# Patient Record
Sex: Female | Born: 1945 | Race: Black or African American | Hispanic: No | Marital: Married | Smoking: Former smoker
Health system: Southern US, Community
[De-identification: ages and names within clinical notes are randomized; demographics above are authoritative.]

## PROBLEM LIST (undated history)

## (undated) DIAGNOSIS — I129 Hypertensive chronic kidney disease with stage 1 through stage 4 chronic kidney disease, or unspecified chronic kidney disease: Secondary | ICD-10-CM

## (undated) DIAGNOSIS — J309 Allergic rhinitis, unspecified: Secondary | ICD-10-CM

## (undated) DIAGNOSIS — M1990 Unspecified osteoarthritis, unspecified site: Secondary | ICD-10-CM

## (undated) DIAGNOSIS — N2581 Secondary hyperparathyroidism of renal origin: Secondary | ICD-10-CM

## (undated) DIAGNOSIS — J069 Acute upper respiratory infection, unspecified: Secondary | ICD-10-CM

## (undated) DIAGNOSIS — R0600 Dyspnea, unspecified: Secondary | ICD-10-CM

## (undated) DIAGNOSIS — J449 Chronic obstructive pulmonary disease, unspecified: Secondary | ICD-10-CM

## (undated) DIAGNOSIS — J45909 Unspecified asthma, uncomplicated: Secondary | ICD-10-CM

## (undated) DIAGNOSIS — E785 Hyperlipidemia, unspecified: Secondary | ICD-10-CM

## (undated) DIAGNOSIS — N189 Chronic kidney disease, unspecified: Secondary | ICD-10-CM

## (undated) DIAGNOSIS — E119 Type 2 diabetes mellitus without complications: Secondary | ICD-10-CM

## (undated) DIAGNOSIS — I10 Essential (primary) hypertension: Secondary | ICD-10-CM

## (undated) HISTORY — DX: Type 2 diabetes mellitus without complications: E11.9

## (undated) HISTORY — PX: CHOLECYSTECTOMY: SHX55

## (undated) HISTORY — DX: Chronic kidney disease, unspecified: N18.9

## (undated) HISTORY — DX: Hyperlipidemia, unspecified: E78.5

## (undated) HISTORY — PX: CORONARY ARTERY BYPASS GRAFT: SHX141

## (undated) HISTORY — DX: Secondary hyperparathyroidism of renal origin: N25.81

## (undated) HISTORY — DX: Unspecified asthma, uncomplicated: J45.909

## (undated) HISTORY — DX: Allergic rhinitis, unspecified: J30.9

## (undated) HISTORY — DX: Hypertensive chronic kidney disease with stage 1 through stage 4 chronic kidney disease, or unspecified chronic kidney disease: I12.9

## (undated) HISTORY — DX: Chronic obstructive pulmonary disease, unspecified: J44.9

## (undated) HISTORY — DX: Unspecified osteoarthritis, unspecified site: M19.90

## (undated) HISTORY — DX: Essential (primary) hypertension: I10

## (undated) HISTORY — DX: Acute upper respiratory infection, unspecified: J06.9

---

## 1979-12-21 HISTORY — PX: PARTIAL HYSTERECTOMY: SHX80

## 2014-11-29 ENCOUNTER — Encounter: Payer: Medicare Other | Attending: Internal Medicine | Admitting: Nutrition

## 2014-11-29 VITALS — Ht 62.0 in | Wt 175.0 lb

## 2014-11-29 DIAGNOSIS — R7303 Prediabetes: Secondary | ICD-10-CM

## 2014-11-29 DIAGNOSIS — E669 Obesity, unspecified: Secondary | ICD-10-CM

## 2014-11-29 NOTE — Progress Notes (Signed)
  Medical Nutrition Therapy:  Appt start time: 1100 end time:  1200.   Assessment:  Primary concerns today: Prediabetes. Lives with her husband and her dog. Has had allergy problems a lot lately preventing her from exercising outdoors. She does the cooking and shopping for the house. Most foods are fried foods. Has cut back on drinking sodas and sweets.  Her A1C was 6.2%. She notes she was craving sweets for awhile and didn't have much energy. Has history of CAD; s/p heart attack.  Preferred Learning Style:    No preference indicated   Learning Readiness:    Ready  Change in progress   MEDICATIONS:    DIETARY INTAKE:  24-hr recall:  B ( AM): Cherrios honey nut dry; raisin bran OR SKIPS most often  Snk ( AM): fruit, swwet tea or some kind of juice L ( PM): Vegetable beef soup homemade 1 cup, 1 slice bread, vienna sausages,  Sweet tea, 8oz or water down soda  Snk ( PM): fruit-grapes D ( PM): Flounder-fried; french fries and pork and beans, Mountain Dew Snk ( PM): drinks some water  Beverages: sweet tea, soda, water  Usual physical activity: walks her   Estimated energy needs: 1500 calories 170 g carbohydrates 112 g protein 42 g fat  Progress Towards Goal(s):  In progress.   Nutritional Diagnosis:  NB-1.1 Food and nutrition-related knowledge deficit As related to Obesity and prediabetes.  As evidenced by A1C of 6.2% and BMI >30.Marland Kitchen    Intervention:  Nutrition counseling for prediabetes, and weight loss on a high fiber, low fat diet using Carb Counting and  Portion control with regular exercise for needed weight loss.. Plan:  Aim for 2-3 Carb Choices per meal (30-45 grams) +/- 1 either way  Avoid snacks between meals. Cut out sweetened beverages. Avoid skipping brekfast. Choose egg with unsugared cereal and low fat milk OR 1 egg and 2 slices toast and a piece of fruit daily or just a PB sandwich would be enough for breakfast. Increase water intake to 8-10 cups per  day. Include protein in moderation with your meals  Increase fresh fruits and vegetables and cut out processed and high fat foods. Consider  increasing your activity level by 30 for 45 minutes daily as tolerated Goal: Lose 1/2-1 lb per week. 2 Get A1C less than 5.7% within the next 3-6 months.  Teaching Method Utilized:  Visual Auditory Hands on  Handouts given during visit include: The Plate Method Meal Plan Card Healthy Weight loss tips.  Barriers to learning/adherence to lifestyle change: none  Demonstrated degree of understanding via:  Teach Back   Monitoring/Evaluation:  Dietary intake, exercise, meal planning, portion control, exercise, and body weight in 1 month(s).

## 2014-12-02 NOTE — Patient Instructions (Signed)
Plan:  Aim for 2-3 Carb Choices per meal (30-45 grams) +/- 1 either way  Avoid snacks between meals. Avoid skipping brekfast. Choose egg with unsugared cereal and low fat milk OR 1 egg and 2 slices toast and a piece of fruit daily or just a PB sandwich would be enough for breakfast. Increase water intake to 8-10 cups per day. Include protein in moderation with your meals  Increase fresh fruits and vegetables and cut out processed and high fat foods. Consider  increasing your activity level by 30 for 45 minutes daily as tolerated Goal: Lose 1/2-1 lb per week. 2 Get A1C less than 5.7% within the next 3-6 months.

## 2015-02-28 ENCOUNTER — Ambulatory Visit: Payer: Medicare Other | Admitting: Nutrition

## 2015-03-14 ENCOUNTER — Ambulatory Visit: Payer: Medicare Other | Admitting: Nutrition

## 2015-04-02 ENCOUNTER — Encounter: Payer: Medicare Other | Attending: Internal Medicine | Admitting: Nutrition

## 2015-04-02 VITALS — Ht 62.0 in | Wt 178.0 lb

## 2015-04-02 DIAGNOSIS — E669 Obesity, unspecified: Secondary | ICD-10-CM

## 2015-04-02 DIAGNOSIS — R739 Hyperglycemia, unspecified: Secondary | ICD-10-CM

## 2015-04-02 NOTE — Patient Instructions (Signed)
Plan:  1. Cut out sodas, sweet and juices. 2. Cut out snacks between meals. 3. Increase low carb vegetables to 2 per meal 4. Increase water to 4-5 per day. 5.  Walking 15 minutes daily. 6. Lose 1 lb per week.

## 2015-04-02 NOTE — Progress Notes (Signed)
  Medical Nutrition Therapy:  Appt start time: 930 end time: 1000.  Assessment:  Primary concerns today: Prediabetes. Gained 3 lbs since last visit. Was trying to wean herself offf Advair. But now is taking twice a day for maintenence and thinks it is making her weight go up due to increased eating from  steroid medication.    Still drinking regular soda and tea which is contributing to her weight gain and possible breathing issues and elevated A1C.. Sugar increases CO2 production making it harder on breathing.      Diet is excessive in CHO at certain meals. Needs more fresh fruits and high fiber lower carb vegetables. Needs to drink a lot more water and cut out liquid sugar beverages.  Preferred Learning Style:    No preference indicated   Learning Readiness:    Ready  Change in progress   MEDICATIONS:    DIETARY INTAKE:  24-hr recall:  B ( AM): Protein drink and 1/2 cup of raisin bran and whole milk. Usually drinks Laidaid. And 1 1/2 slices bacon Snk ( AM): sometimes has a snack: adkins bar or jello parfait SF L ( PM)  : Roast beef sandwich on [employer] bread and corn 1/2c, Coke or Mt Dew or Tea or water  Snk ( PM): none D ( PM): Beef stew, corn, pinto beans. Sweet tea Snk ( PM): drinks some water  Beverages: sweet tea, soda, water,  Usual physical activity: Walks some.  Estimated energy needs: 1500 calories 170 g carbohydrates 112 g protein 42 g fat  Progress Towards Goal(s):  In progress.   Nutritional Diagnosis:  NB-1.1 Food and nutrition-related knowledge deficit As related to Obesity and prediabetes.  As evidenced by A1C of 6.2% and BMI >30.Marland Kitchen    Intervention:  Nutrition counseling for prediabetes, and weight loss on a high fiber, low fat diet using Carb Counting and  Portion control with regular exercise for needed weight loss.. Plan:  1. Cut out sodas, protein drinks, sweet and juices. 2. Cut out snacks between meals. 3. Increase low carb vegetables to 2 per  meal 4. Increase water to 4-5 per day. 5.  Walking 15 minutes daily. 6. Lose 1 lb per week.   Teaching Method Utilized:  Visual Auditory Hands on  Handouts given during visit include: The Plate Method Meal Plan Card Healthy Weight loss tips.  Barriers to learning/adherence to lifestyle change: none  Demonstrated degree of understanding via:  Teach Back   Monitoring/Evaluation:  Dietary intake, exercise, meal planning, portion control, exercise, and body weight in 1 month(s).

## 2015-05-21 ENCOUNTER — Encounter: Payer: Self-pay | Admitting: Nutrition

## 2015-05-21 ENCOUNTER — Encounter: Payer: Medicare Other | Attending: Physician Assistant | Admitting: Nutrition

## 2015-05-21 VITALS — Ht 62.0 in | Wt 178.0 lb

## 2015-05-21 DIAGNOSIS — E669 Obesity, unspecified: Secondary | ICD-10-CM

## 2015-05-21 DIAGNOSIS — R739 Hyperglycemia, unspecified: Secondary | ICD-10-CM

## 2015-05-21 NOTE — Patient Instructions (Addendum)
Plan:  1. Cut out sodas 100! 2. Cut out snacks between meals. 3. Continue to increase low carb vegetables to 2 per meal 4. Increase water to 4-5 per day. 5.  Walking 30  Minutes a day 4 times per week. 6. Lose 1 lb per week. 7. Get treadmill from your son and walking on it daily.

## 2015-05-21 NOTE — Progress Notes (Signed)
  Medical Nutrition Therapy:  Appt start time: 0800 end time: 0830  Assessment:  Primary concerns today: Prediabetes and overweight. No recent A1C.  Gained 3 lbs since last visit. Has been cutting down on sodas and snacksf. She has been walking some but not enough. Complians of shortness of breath at times on some days.. CHanges: Has been working adding more fresh vegetables and fruit. Has cut down on sodas and has been drinking more water.  No weight loss. Willing to increase walking since she can tell improvement in her endurance. Going to get her treadmilll from her son to use to walk inside when it's too hot outside.  Preferred Learning Style:    No preference indicated   Learning Readiness:    Ready  Change in progress   MEDICATIONS:    DIETARY INTAKE:  24-hr recall:  B ( AM): Pb sandwich OR Oatmeal and 1 egg. Snk ( AM): none L ( PM)  :  Hot dog and soda watered down. Snk ( PM): none D ( PM): Squash, baked beans, green beans, BBQ, water Snk ( PM): none Beverages: soda, water,  Usual physical activity: Walks some.  Estimated energy needs: 1500 calories 170 g carbohydrates 112 g protein 42 g fat  Progress Towards Goal(s):  In progress.   Nutritional Diagnosis:  NB-1.1 Food and nutrition-related knowledge deficit As related to Obesity and prediabetes.  As evidenced by A1C of 6.2% and BMI >30.Marland Kitchen    Intervention:  Nutrition counseling for prediabetes, and weight loss on a high fiber, low fat diet using Carb Counting and  Portion control with regular exercise for needed weight loss.. Plan:  1. Cut out sodas 100%. 2. Cut out snacks between meals un less its a vegetable.. 3. Continue to increase low carb vegetables to 2 per meal 4. Increase water to 4-5 bottles  per day. 5. Start walking 30  Minutes a day 4 times per week. 6. Lose 1 lb per week-10 lbs by next visit at least in three months. 7. Get treadmill from your son and walking on it daily.  Teaching Method  Utilized:  Visual Auditory Hands on  Handouts given during visit include: The Plate Method Meal Plan Card Healthy Weight loss tips.  Barriers to learning/adherence to lifestyle change: none  Demonstrated degree of understanding via:  Teach Back   Monitoring/Evaluation:  Dietary intake, exercise, meal planning, portion control, exercise, and body weight in 3 month(s).

## 2015-08-21 ENCOUNTER — Ambulatory Visit: Payer: Medicare Other | Admitting: Nutrition

## 2015-11-18 ENCOUNTER — Ambulatory Visit (INDEPENDENT_AMBULATORY_CARE_PROVIDER_SITE_OTHER): Payer: Medicare Other | Admitting: Allergy and Immunology

## 2015-11-18 ENCOUNTER — Encounter: Payer: Self-pay | Admitting: Allergy and Immunology

## 2015-11-18 VITALS — BP 120/68 | HR 60 | Temp 97.9°F | Resp 16

## 2015-11-18 DIAGNOSIS — J449 Chronic obstructive pulmonary disease, unspecified: Secondary | ICD-10-CM | POA: Diagnosis not present

## 2015-11-18 MED ORDER — BUDESONIDE-FORMOTEROL FUMARATE 160-4.5 MCG/ACT IN AERO: 2.0000 | INHALATION_SPRAY | Freq: Two times a day (BID) | RESPIRATORY_TRACT | Status: DC

## 2015-11-18 MED ORDER — FLUTICASONE PROPIONATE 50 MCG/ACT NA SUSP: 1.0000 | Freq: Every day | NASAL | Status: DC

## 2015-11-18 MED ORDER — MONTELUKAST SODIUM 10 MG PO TABS: 10.0000 mg | ORAL_TABLET | Freq: Every day | ORAL | Status: DC

## 2015-11-18 MED ORDER — ALBUTEROL SULFATE HFA 108 (90 BASE) MCG/ACT IN AERS: 2.0000 | INHALATION_SPRAY | Freq: Four times a day (QID) | RESPIRATORY_TRACT | Status: DC | PRN

## 2015-11-18 NOTE — Progress Notes (Signed)
FOLLOW UP NOTE  RE: Lynea Staats MRN: [id number] DOB: 06/28/1946 [facility] [place] [address], [place] [address]  Date of Office Visit: 11/18/2015  Subjective:  Jesseka Swoveland is a 69 y.o. female who presents today for Medication Management and Medication Refill  Assessment:  1.  Allergic rhinoconjunctivitis--appears well controlled. 2.  Previous history of asthma with presumed COPD--noted significant restrictive in office spirometry. 3.  Former smoker. Plan:   Meds ordered this encounter  Medications  . montelukast (SINGULAIR) 10 MG tablet    Sig: Take 1 tablet (10 mg total) by mouth daily.    Dispense:  30 tablet    Refill:  5  . fluticasone (FLONASE) 50 MCG/ACT nasal spray    Sig: Place 1 spray into both nostrils daily.    Dispense:  16 g    Refill:  5  . albuterol (VENTOLIN HFA) 108 (90 BASE) MCG/ACT inhaler    Sig: Inhale 2 puffs into the lungs every 6 (six) hours as needed for wheezing or shortness of breath (cough).    Dispense:  1 Inhaler    Refill:  2  . budesonide-formoterol (SYMBICORT) 160-4.5 MCG/ACT inhaler    Sig: Inhale 2 puffs into the lungs 2 (two) times daily.    Dispense:  1 Inhaler    Refill:  2   Patient Instructions   1. Avoidance: Mold 2. Antihistamine: Allegra 180mg  by mouth once  daily for runny nose or itching. 3. Nasal Spray: Flonase 1-2 spray(s) each nostril once daily for stuffy nose or drainage.  4. Inhalers:  Rescue: Ventolin 2 puffs every 4 hours as needed for cough or wheeze.       -May use 2 puffs 10-20 minutes prior to exercise.  Preventative: Symbicort 16mcg 2 puffs twice daily (Rinse, gargle, and spit out after use). As Ruthe Mannan was not on patient's formulary.       STOP ADVAIR 5.  Other: Singulair 10mg  each evening. 6. Nasal Saline wash followed by nasal spray once daily and each evening at shower/bath time. 7. Follow up Visit: 4 months or sooner if needed.    Referral to  Pulmonology.  HPI: Naava returns to the office in follow-up of allergic rhinitis and presumed COPD/asthma.  Since her last visit in August, she reports feeling "good" without new issues, acute care or emergency for visits, prednisone or antibiotic courses.  She may have intermittent nasal congestion but does not seem to have much cough and denies wheeze, difficulty breathing, chest congestion/tightness or shortness of breath.  She has not seen a pulmonologist prior and typically has only used Advair.  She is open to modification to her medication regime as well as evaluation by pulmonology, for additional management.  No specific disruption of sleep or activity.  She denies headache, sore throat, fever or discolored drainage.  Current Medications: 1.  Singulair 10 mg daily. 2.  Allegra 180 mg daily. 3.  Flonase 1-2 sprays once daily. 4.  Advair 500 one puff twice daily. 5.  Ventolin as needed. 6.   Continues Norvasc, aspirin, Lipitor, Mucinex, lisinopril and Toprol.  Drug Allergies: No Known Allergies  Objective:   Filed Vitals:   11/18/15 1611  BP: 120/68  Pulse: 60  Temp: 97.9 F (36.6 C)  Resp: 16   pulse ox             95%  Physical Exam  Constitutional: She is well-developed, well-nourished, and in no distress.  HENT:  Head: Atraumatic.  Right Ear: Tympanic membrane and ear canal normal.  Left Ear: Tympanic membrane and ear canal normal.  Nose: Mucosal edema present. No rhinorrhea. No epistaxis.  Mouth/Throat: Oropharynx is clear and moist and mucous membranes are normal. No oropharyngeal exudate, posterior oropharyngeal edema or posterior oropharyngeal erythema.  Neck: Neck supple.  Cardiovascular: Normal rate, S1 normal and S2 normal.   No murmur heard. Pulmonary/Chest: Effort normal. She has no wheezes. She has no rhonchi. She has no rales.  Lymphadenopathy:    She has no cervical adenopathy.    Diagnostics: Spirometry:  FVC  1.04--48%, FEV1  0.80--46%, FEV1%  96    Rose Hippler M. Ishmael Holter, MD  cc: [facility]

## 2015-11-18 NOTE — Patient Instructions (Addendum)
Take Home Sheet  1. Avoidance: Mold   2. Antihistamine: Allegra 180mg  by mouth once  daily for runny nose or itching.   3. Nasal Spray: Flonase 1-2 spray(s) each nostril once daily for stuffy nose or drainage.    4. Inhalers:  Rescue: Ventolin 2 puffs every 4 hours as needed for cough or wheeze.       -May use 2 puffs 10-20 minutes prior to exercise.   Preventative: Symbicort 185mcg 2 puffs twicedaily (Rinse, gargle, and spit out after use).      STOP ADVAIR  5.  Other: Singulair 10mg  each evening.   6. Nasal Saline wash followed by nasal spray once daily and each evening at shower/bath time.   7. Follow up Visit: 4 months or sooner if needed.    Referral to Pulmonology.  Websites that have reliable Patient information: 1. American Academy of Asthma, Allergy, & Immunology: www.aaaai.org 2. Food Allergy Network: www.foodallergy.org 3. Mothers of Asthmatics: www.aanma.org 4. [place]: DiningCalendar.de 5. American College of Allergy, Asthma, & Immunology: https://robertson.info/ or www.acaai.org

## 2015-11-20 ENCOUNTER — Telehealth: Payer: Self-pay

## 2015-11-20 NOTE — Telephone Encounter (Signed)
Called Debra [name] to advise about appointment date and time for Pulmonologist appointment (Dr. Chase Caller 02/11/16 at 2:00 PM) and she states that she doesn't want to go to anyone in [place] since she lives in [place], [place].  I told patient will voice this to Dr. Ishmael Holter and see if we can change locations and doctor for her.  Debra [name] states since she lives in [place], [place], see would like to see someone there or in [place] since she is familiar with these locations.

## 2015-11-20 NOTE — Telephone Encounter (Addendum)
Per Dr. Ishmael Holter' notes on office visit 11/18/15, she wanted the patient to be referred to a Pulmonologist.  Appointment has been made with Dr. Brand Males on Wed. 02/11/16 at 2:15 PM and arrive 2:00 PM.

## 2015-11-21 NOTE — Telephone Encounter (Signed)
Inform patient my preference is Dr. Chase Caller, and unaware of other options, we will inquire further.

## 2015-11-21 NOTE — Telephone Encounter (Signed)
Called and spoke to patient and notified of referral. Patient will follow up with Dr Davina Poke as advised.

## 2015-11-21 NOTE — Telephone Encounter (Signed)
Called and referred patient to Dr Dolores Frame neill,MD in [facility]. Patient has an appointment on December 16th at 10:45am and needs to arrive by 10:15am. Patient needs to bring a list of all medications, insurance card, and copay as needed. Called and left voicemail for patient to return phone call to notify about appointment.

## 2016-01-16 ENCOUNTER — Telehealth: Payer: Self-pay

## 2016-01-16 MED ORDER — PREDNISONE 10 MG PO TABS: ORAL_TABLET | ORAL | Status: DC

## 2016-01-16 NOTE — Telephone Encounter (Signed)
Inform patient to return to Advair and stop Symbicort.  Obtain full and further details about cough and associated symptoms, albuterol neb only or HFA and frequency etc.

## 2016-01-16 NOTE — Telephone Encounter (Signed)
Phone call to patient--reports 7 days of symptoms= congestion, cough, wheezing, chest congestion without fever, headache, sore throat or shortness of breath or difficulty in breathing.  Denies discolored drainage or other symptoms.  She started Advair today.  Pharmacy--[facility] on [address]  She Saw Dr. Marisue Ivan on [address] and they added Spiriva but did not address any of her acute symptoms.   --Begin Prednisone 30 mg daily for 2 days, 10 mg daily for 2 days and make follow-up visit for next week and she will review with the family member who can drive her [place]. --Use Saline nasal wash 2-4 times daily. --May use albuterol HFA or nebulizer 2-4 times daily. --Reviewed with Ms. Landingham if persisting symptoms or lack of improvement to call back for on-call physician throughout our office or go to the emergency department.  Patient agreed with plan.

## 2016-01-16 NOTE — Telephone Encounter (Signed)
Patient was last seen on 11/18/15 by Dr. Ishmael Holter. Patient was switched to Symbicort from Advair and she really doesn't see any improvement with Symbicort and would like to switch back to the Advair. She has 3 refills on her Advair already. Also Patient has a bad cough that she can not get rid of and is wondering is there something she could try. Ms.Branna went to see Dr. Marisue Ivan and he put her on Spiriva and would like her to take it with the Advair or Symbicort.   Please Advise  Thanks

## 2016-01-16 NOTE — Telephone Encounter (Signed)
Spoke to patient and she stated that she feels that the Symbicort was not helping her asthma and wanted to be switched back to Advair. She also stated that she had to use her neb for the first time in a long time.  She asked if something could be called in and informed her that she would need to be seen in the office before the that could happen.  She stated that she would go to an urgent care if it gets worse because she lives in [place] and that she does not drive in [place].

## 2016-01-16 NOTE — Telephone Encounter (Signed)
Patient stated that she has had a cough with nasal congestion for about a week.  She stated that she thinks that she may be coming down with a cold but she has no fever.  She stated that she has used her HFA last night and her albuterol neb this morning.  She stated that she only use either one every 6 hours as needed. But only have used them twice a day since she has been sick.

## 2016-01-16 NOTE — Telephone Encounter (Signed)
Prednisone sent to pharmacy.  Patient is already aware and will continue other medications as previously except as discussed.

## 2016-01-22 ENCOUNTER — Encounter: Payer: Self-pay | Admitting: Allergy and Immunology

## 2016-01-22 ENCOUNTER — Ambulatory Visit (INDEPENDENT_AMBULATORY_CARE_PROVIDER_SITE_OTHER): Payer: Medicare Other | Admitting: Allergy and Immunology

## 2016-01-22 VITALS — BP 136/78 | HR 56 | Temp 98.1°F | Resp 16

## 2016-01-22 DIAGNOSIS — J309 Allergic rhinitis, unspecified: Secondary | ICD-10-CM

## 2016-01-22 DIAGNOSIS — J3089 Other allergic rhinitis: Secondary | ICD-10-CM

## 2016-01-22 DIAGNOSIS — J449 Chronic obstructive pulmonary disease, unspecified: Secondary | ICD-10-CM | POA: Diagnosis not present

## 2016-01-22 NOTE — Progress Notes (Addendum)
     FOLLOW UP NOTE  RE: Debra [name] MRN: [id number] DOB: 1946-04-06 [facility] [place] [address] [place] [address] Date of Office Visit: 01/22/2016  Subjective:  Debra [name] is a 70 y.o. female who presents today for Wheezing; Cough; and Nasal Congestion  Assessment:   1. Chronic obstructive pulmonary disease, unspecified COPD, unspecified asthma type.   2. Perennial allergic rhinitis.    Plan:   Patient Instructions  1. Advair 556mcg one puff twice daily.  (rinse, gargle and spit with water after use). 2. ProAir or albuterol neb as needed for cough or wheeze every 4 hours. 3. Use sample of Dymista 1 spray twice daily-then return to Flonase 1-2 sprays once daily. 4. Continue other medications-- Singulair, Allegra daily. 5. Spiriva per Dr. Audie Box. 6. Prednisone 30mg  now and 20mg  daily for 3 days. 7. Saline nasal wash twice daily and prior to Flonase. 8. Follow-up in 1-2 months or sooner if needed.  HPI: Debra [name] returns to the office regarding cough and recent wheezing.  We added prednisone to her regime on Jan 27th which she completed yesterday with noted improvement at least 50% from her symptoms which began about 12 days ago.  She initially began with nasal congestion, sneezing, then cough and now over the last 5 days intermittent wheezing with slight chest tightness.  She denies shortness of breath, chest congestion, difficulty in breathing, fever, discolored drainage, sore throat or headache.  She reports her sleep is not disrupted and has returned to the Advair as discussed on the phone, as she thought the Symbicort was not as beneficial.  She used her albuterol neb a few times a day, the last time 4 days ago (no ProAir).  She reports the pulmonologist added Spiriva, though we are still awaiting the notes from that consultation.  Denies ED or urgent care visits, or antibiotic courses. Reports sleep and activity are normal.   She uses Zyrtec or  Allegra, but not both.  Debra [name] has a current medication list which includes the following prescription(s):  albuterol, amlodipine, aspirin, atorvastatin, cetirizine, co-enzyme q-10, fexofenadine, fluticasone-salmeterol, gabapentin, lisinopril, metoprolol tartrate, montelukast and tiotropium bromide monohydrate.   Drug Allergies: Allergies  Allergen Reactions  . Aspirin     GI upset   Objective:   Filed Vitals:   01/22/16 1052 01/22/16 1149  BP: 160/72 136/78  Pulse: 56   Temp: 98.1 F (36.7 C)   Resp: 16    SpO2 Readings from Last 1 Encounters:  01/22/16 96%   Physical Exam  Constitutional: She is well-developed, well-nourished, and in no distress.  HENT:  Head: Atraumatic.  Right Ear: Tympanic membrane and ear canal normal.  Left Ear: Tympanic membrane and ear canal normal.  Nose: Mucosal edema and rhinorrhea (scant clear mucus.) present. No epistaxis.  Mouth/Throat: Oropharynx is clear and moist and mucous membranes are normal. No oropharyngeal exudate, posterior oropharyngeal edema or posterior oropharyngeal erythema.  Neck: Neck supple.  Cardiovascular: Normal rate, S1 normal and S2 normal.   No murmur heard. Pulmonary/Chest: Effort normal. She has no wheezes. She has no rhonchi. She has no rales.  Post Xopenex/Atrovent neb: Continues to be clear.  Patient without wheeze, rhonchi or crackles. Patient reports improved.   Lymphadenopathy:    She has no cervical adenopathy.   Diagnostics: Spirometry:  FVC 1.08--50%,  FEV1 0.85--49%; postbronchodilator improvement FVC  1.16--54%, FEV1 0.88--51%.    [name] Ishmael Holter, MD  cc: [facility]

## 2016-01-22 NOTE — Patient Instructions (Addendum)
  Advair 560mcg one puff twice daily.  (rinse, gargle and spit with water after use).  ProAir or albuterol neb as needed for cough or wheeze every 4 hours.  Use sample of Dymista 1 spray twice daily-then return to Flonase 1-2 sprays once daily.  Continue other medications-- Singulair, Allegra daily.  Spiriva per Dr. Audie Box.  Prednisone 30mg  now and 20mg  daily for 3 days.  Saline nasal wash twice daily and prior to Flonase.  Follow-up in 1-2 months or sooner if needed.

## 2016-01-27 ENCOUNTER — Telehealth: Payer: Self-pay

## 2016-01-27 NOTE — Telephone Encounter (Signed)
Spoke with Dr. Ishmael Holter verbally in the office 01/27/16 and she states to inform patient that she needs to contact the lung specialist that prescribed the Spiriva.

## 2016-01-27 NOTE — Telephone Encounter (Signed)
Tried to call patient.  No answer.  Need to verify with her the medication list again.  Still having a problem with the computer and I need to verify again.

## 2016-01-27 NOTE — Telephone Encounter (Signed)
Tried to call patient again.  No answer.  Left message to call our office.

## 2016-01-27 NOTE — Telephone Encounter (Signed)
Tried to call patient again today at 4:32 PM.  No answer.  Left message to call the [place] office today or [facility] office starting at 8:30 AM on 01/28/16.

## 2016-01-27 NOTE — Addendum Note (Signed)
Addended by: Janace Hoard E on: 01/27/2016 04:09 PM   Modules accepted: Medications

## 2016-01-27 NOTE — Telephone Encounter (Signed)
Left message for patient to call me in the [place] office today or [facility] tomorrow.  Need to get medication list for Dr. Ishmael Holter.  Patient called back and she and I updated and reviewed her medications for her last visit.  While on the phone she wanted to know if perhaps Spiriva is causing her mouth to be sore.  She is complaining of her mouth being sore.  She states is rinsing, gargling and spitting with water after use.  Please advise.

## 2016-01-28 NOTE — Telephone Encounter (Signed)
Medications reviewed in epic . Called and left voicemail for patient to contact us back.

## 2016-01-28 NOTE — Telephone Encounter (Signed)
Patient notified and will contact lung doctor if needed.

## 2016-01-28 NOTE — Addendum Note (Signed)
Addended by: Gean Quint on: 01/28/2016 11:06 PM   Modules accepted: Medications

## 2016-01-28 NOTE — Telephone Encounter (Signed)
Patient returning phone call 

## 2016-02-11 ENCOUNTER — Institutional Professional Consult (permissible substitution): Payer: Medicare Other | Admitting: Internal Medicine

## 2016-03-23 ENCOUNTER — Ambulatory Visit (INDEPENDENT_AMBULATORY_CARE_PROVIDER_SITE_OTHER): Payer: Medicare Other | Admitting: Allergy and Immunology

## 2016-03-23 ENCOUNTER — Encounter: Payer: Self-pay | Admitting: Allergy and Immunology

## 2016-03-23 VITALS — BP 125/75 | HR 60 | Temp 97.6°F | Resp 17

## 2016-03-23 DIAGNOSIS — J309 Allergic rhinitis, unspecified: Secondary | ICD-10-CM

## 2016-03-23 DIAGNOSIS — J449 Chronic obstructive pulmonary disease, unspecified: Secondary | ICD-10-CM

## 2016-03-23 DIAGNOSIS — J3089 Other allergic rhinitis: Secondary | ICD-10-CM

## 2016-03-23 MED ORDER — FLUTICASONE-SALMETEROL 500-50 MCG/DOSE IN AEPB: 1.0000 | INHALATION_SPRAY | Freq: Two times a day (BID) | RESPIRATORY_TRACT | Status: DC

## 2016-03-23 NOTE — Progress Notes (Signed)
     FOLLOW UP NOTE  RE: Debra [name] MRN: [id number] DOB: 1946/09/17 [facility] Debra [name]. [place], [place] [address] Date of Office Visit: 03/23/2016  Subjective:  Debra [name] is a 70 y.o. female who presents today for Asthma  Assessment:   1. Chronic obstructive pulmonary disease, unspecified COPD, unspecified asthma type.   2. Perennial allergic rhinitis   3.      Complex medical history Plan:   Meds ordered this encounter  Medications  . Fluticasone-Salmeterol (ADVAIR DISKUS) 500-50 MCG/DOSE AEPB    Sig: Inhale 1 puff into the lungs 2 (two) times daily.    Dispense:  1 each    Refill:  3   1.  Debra [name] will continue current medication regime.--Advair and Allegra. 2.  Consistently use Dymista one spray each morning and as needed each evening.          When sample is empty, return to [facility] through the spring season one spray once to twice daily. 3.  Keep follow-up with pulmonology as planned---and additional management per their office. 4.  Follow-up in 6 months or sooner if needed.  HPI: Debra [name] returns to the office requesting refill of Advair, as she is very pleased with her regime since mild increase in symptoms at last visit in February 2017.  She notices mild rhinorrhea with the spring pollen season without congestion, sneezing, itchy, watery postnasal drip, cough or wheeze.  No shortness of breath, chest congestion or albuterol use in the last several months.  She understands that evaluation last with  Pulmonology has made new diagnoses, a term, which she cannot pronounce.  She reports they have recommended supplemental O2 at night and that she had a normal cardiac echo.  She has not been participating in much exercise.  No other new medical issues.  She has remained off Spiriva. Denies ED or urgent care visits, prednisone or antibiotic courses. Reports sleep and activity are normal.   Debra [name] has a current medication list which includes the following  prescription(s): albuterol, albuterol, amlodipine, aspirin, atorvastatin, co-enzyme q-10, fexofenadine, fluticasone-salmeterol, gabapentin, lidocaine, lisinopril, metoprolol, montelukast.   Drug Allergies: Allergies  Allergen Reactions  . Aspirin     GI upset   Objective:   Filed Vitals:   03/23/16 1011  BP: 125/75  Pulse: 60  Temp: 97.6 F (36.4 C)  Resp: 17   Physical Exam  Constitutional: She is well-developed, well-nourished, and in no distress.  HENT:  Head: Atraumatic.  Right Ear: Tympanic membrane and ear canal normal.  Left Ear: Tympanic membrane and ear canal normal.  Nose: Mucosal edema present. No rhinorrhea. No epistaxis.  Mouth/Throat: Oropharynx is clear and moist and mucous membranes are normal. No oropharyngeal exudate, posterior oropharyngeal edema or posterior oropharyngeal erythema.  Neck: Neck supple.  Cardiovascular: Normal rate, S1 normal and S2 normal.   No murmur heard. Pulmonary/Chest: Effort normal. She has no wheezes. She has no rhonchi. She has no rales.  Lymphadenopathy:    She has no cervical adenopathy.   Diagnostics: Spirometry:  FVC 1.00--51%, FEV1 0.75--46%.    Debra M. Ishmael Holter, MD  cc: [facility]

## 2016-03-23 NOTE — Patient Instructions (Addendum)
   Continue current medication regime.--Advair and Allegra.  Consistently use.  Dymista one spray each morning and as needed each evening.          When sample is empty, return to [facility] through the spring season one spray once to twice daily.  Keep follow-up with pulmonology as planned.  Follow-up in 6 months or sooner if needed.

## 2016-05-07 ENCOUNTER — Other Ambulatory Visit (HOSPITAL_COMMUNITY): Payer: Self-pay | Admitting: Nephrology

## 2016-05-07 DIAGNOSIS — N183 Chronic kidney disease, stage 3 unspecified: Secondary | ICD-10-CM

## 2016-05-14 ENCOUNTER — Ambulatory Visit (HOSPITAL_COMMUNITY)
Admission: RE | Admit: 2016-05-14 | Discharge: 2016-05-14 | Disposition: A | Discharge status: Home / Self Care | Payer: Medicare Other | Source: Ambulatory Visit | Attending: Nephrology | Admitting: Nephrology

## 2016-05-14 DIAGNOSIS — N281 Cyst of kidney, acquired: Secondary | ICD-10-CM | POA: Diagnosis not present

## 2016-05-14 DIAGNOSIS — R938 Abnormal findings on diagnostic imaging of other specified body structures: Secondary | ICD-10-CM | POA: Diagnosis not present

## 2016-05-14 DIAGNOSIS — N183 Chronic kidney disease, stage 3 unspecified: Secondary | ICD-10-CM

## 2016-06-07 ENCOUNTER — Other Ambulatory Visit: Payer: Self-pay | Admitting: Allergy and Immunology

## 2016-09-28 ENCOUNTER — Encounter: Payer: Self-pay | Admitting: Allergy & Immunology

## 2016-09-28 ENCOUNTER — Encounter (INDEPENDENT_AMBULATORY_CARE_PROVIDER_SITE_OTHER): Payer: Self-pay

## 2016-09-28 ENCOUNTER — Ambulatory Visit (INDEPENDENT_AMBULATORY_CARE_PROVIDER_SITE_OTHER): Payer: Medicare Other | Admitting: Allergy & Immunology

## 2016-09-28 VITALS — BP 120/70 | HR 65 | Temp 98.0°F | Resp 16

## 2016-09-28 DIAGNOSIS — B370 Candidal stomatitis: Secondary | ICD-10-CM | POA: Diagnosis not present

## 2016-09-28 DIAGNOSIS — J3089 Other allergic rhinitis: Secondary | ICD-10-CM | POA: Diagnosis not present

## 2016-09-28 DIAGNOSIS — J4489 Other specified chronic obstructive pulmonary disease: Secondary | ICD-10-CM | POA: Insufficient documentation

## 2016-09-28 DIAGNOSIS — J449 Chronic obstructive pulmonary disease, unspecified: Secondary | ICD-10-CM | POA: Diagnosis not present

## 2016-09-28 MED ORDER — FLUCONAZOLE 100 MG PO TABS: 100.0000 mg | ORAL_TABLET | Freq: Every day | ORAL | 0 refills | Status: AC

## 2016-09-28 NOTE — Progress Notes (Signed)
FOLLOW UP  Date of Service/Encounter:  09/28/16   Assessment:   Chronic obstructive pulmonary disease, unspecified COPD type ([place]) - Plan: Spirometry with Graph  Perennial allergic rhinitis  Oral candidiasis   Plan/Recommendations:   1. Chronic obstructive pulmonary disease, unspecified COPD type ([place]) - Continue with Advair 500/50 one puff in the morning and one puff at night. - Continue with albuterol 4 puffs every 4-6 hours as needed or one nebulizer treatment every 4-6 hours as needed. - Lung function was improved today compared to the last visit.  - Patient declined nebulizer treatment since she was feeling well today. - I recommended continued treatment with the pulmonologist.  2. Perennial allergic rhinitis - Continue with Allegra or Zyrtec as well as Flonase daily. - Let us know if you need refills. - We can consider immunotherapy in the future if no improvement.   3. Oral candidiasis - Prescription sent in for Diflucan 100mg  tablet once daily for seven days. - Call us if no improvement. - Recommended continued mouth washing with the Advair.  4. Return in about 6 months (around 03/29/2017).     Subjective:   Debra [name] is a 70 y.o. female presenting today for follow up of  Chief Complaint  Patient presents with  . Asthma  .  Debra [name] has a history of the following: Patient Active Problem List   Diagnosis Date Noted  . Chronic obstructive pulmonary disease ([place]) 09/28/2016  . Perennial allergic rhinitis 09/28/2016    History obtained from: chart review and patient.  Debra [name] was referred by [facility].     Debra [name] is a 70 y.o. female presenting for a follow up visit. Debra [name] was last seen in April 2017 by Dr. Ishmael Holter, who has since left the practice. At that time, she was continued on her medications including Advair 500/50 one puff twice daily as well as Allegra and Dymista one spray BID as needed. It was  recommended that she see a pulmonologist. It has previously been recommended that she use supplemental O2 at night. She has been on Spiriva in the past without improvement.   Since the last visit, she has done well. She has not been gargling with the Advair recently and developed thrush in her mouth. She typically swishes with water after using the Advair, but has not been doing this as routinely over the last few days. She does have albuterol in a nebulizer and inhaler form, which she uses less than once per week. She is very happy with how well she is doing. She last had a flare in June 2017 and required an ER visit. At that time, she was placed on supplemental O2 for hypoxia of 80%. She was also placed on steroids with improvement of her symptoms. She estimates that she needs prednisone around 3 times per year.  The patient does have a history of allergies and uses Allegra or Zyrtec. She will occasionally changes between the 2. She also uses Flonase 2 sprays per nostril daily. Her worst time of the season as in the winter. She has received her flu shot, pneumonia shot, and shingles shot.  Her pulmonologist is Dr. Koleen Nimrod in [place], [place]. She is on oxygen at night. 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 She is seen by Dr. Burney Gauze, who is a cardiologist at [facility] in [place]. She lives at home with her husband. She does have grandchildren. 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  Otherwise, there have been no changes to the past medical history, surgical history, family history, or social history.     Review of Systems: a 14-point review of systems is pertinent for what is mentioned in HPI.  Otherwise, all other systems were negative. Constitutional: negative other than that listed in the HPI Eyes: negative other than that listed in the HPI Ears, nose, mouth,  throat, and face: negative other than that listed in the HPI Respiratory: negative other than that listed in the HPI Cardiovascular: negative other than that listed in the HPI Gastrointestinal: negative other than that listed in the HPI Genitourinary: negative other than that listed in the HPI Integument: negative other than that listed in the HPI Hematologic: negative other than that listed in the HPI Musculoskeletal: negative other than that listed in the HPI Neurological: negative other than that listed in the HPI Allergy/Immunologic: negative other than that listed in the HPI    Objective:   Blood pressure 120/70, pulse 65, temperature 98 F (36.7 C), temperature source Oral, resp. rate 16, SpO2 95 %. There is no height or weight on file to calculate BMI.   Physical Exam:  General: Alert, interactive, in no acute distress. Very pleasant female. Smiling throughout the exam. HEENT: TMs pearly gray, turbinates edematous and pale with clear discharge, post-pharynx erythematous. Neck: Supple without thyromegaly. Lungs: Decreased breath sounds with expiratory wheezing bilaterally. No increased work of breathing. CV: Normal S1, S2 without murmurs. Capillary refill <2 seconds.  Abdomen: Nondistended, nontender.  Skin: Warm and dry, without lesions or rashes. Extremities:  No clubbing, cyanosis or edema.  Neuro:   Grossly intact. No focal deficits.   Diagnostic studies:  Spirometry: results abnormal (FEV1: 0.81/50%, FVC: 1.56/80%, FEV1/FVC: 51%).    Spirometry consistent with moderate obstructive disease. However compared to her previous numbers marked improved values. Her FVC was 1.05 and now is up to 1.56 and her FEV1 has remained stable. Her FEV1/FVC ratio was improved slightly from 49% to 51%.     Salvatore Marvel, MD [facility] [place]

## 2016-09-28 NOTE — Patient Instructions (Addendum)
1. Chronic obstructive pulmonary disease, unspecified COPD type ([place]) - Continue with Advair 500/50 one puff in the morning and one puff at night. - Continue with albuterol 4 puffs every 4-6 hours as needed or one nebulizer treatment every 4-6 hours as needed. - Lung function was improved today compared to the last visit.  2. Perennial allergic rhinitis - Continue with Allegra or Zyrtec as well as Flonase daily. - Let us know if you need refills.  3. Oral candidiasis - Prescription sent in for Diflucan 100mg  tablet once daily for seven days. - Call us if no improvement.  4. Return in about 6 months (around 03/29/2017).  Please inform us of any Emergency Department visits, hospitalizations, or changes in symptoms. Call us before going to the ED for breathing or allergy symptoms since we might be able to fit you in for a sick visit. Feel free to contact us anytime with any questions, problems, or concerns.  It was a pleasure to meet you today!   Websites that have reliable patient information: 1. American Academy of Asthma, Allergy, and Immunology: www.aaaai.org 2. Food Allergy Research and Education (FARE): foodallergy.org 3. Mothers of Asthmatics: http://www.asthmacommunitynetwork.org 4. American College of Allergy, Asthma, and Immunology: www.acaai.org

## 2016-10-14 ENCOUNTER — Telehealth: Payer: Self-pay | Admitting: Allergy & Immunology

## 2016-10-14 NOTE — Telephone Encounter (Signed)
Left message to return call 

## 2016-10-14 NOTE — Telephone Encounter (Signed)
Patient saw Dr. Ernst Bowler on 09/28/16 and was given a prescription for Advair. She went to pick it up and it cost $147. She said the last time she got it, it cost $55. She wants to know if there is an alternative that is less expensive.

## 2016-10-14 NOTE — Telephone Encounter (Signed)
Please advise 

## 2016-10-14 NOTE — Telephone Encounter (Signed)
She uses [facility] in [place].

## 2016-10-14 NOTE — Telephone Encounter (Signed)
We can either do Dulera 200/5 two puffs twice daily or Symbicort 160/4.5 two puffs twice daily. We could also try Advair HFA 230/21 two puffs twice daily. She will need a spacer for any of these. If she would prefer not to use a spacer, we could do Breo 200/25 one puff daily.  Salvatore Marvel, MD [facility] [place]

## 2016-10-15 MED ORDER — FLUTICASONE FUROATE-VILANTEROL 200-25 MCG/INH IN AEPB: 1.0000 | INHALATION_SPRAY | Freq: Every day | RESPIRATORY_TRACT | 5 refills | Status: DC

## 2016-10-15 NOTE — Telephone Encounter (Signed)
Pt does not want to use spacer at this time. Advised pt that I will send in Breo 200/25 into her pharmacy per Dr. Ernst Bowler.

## 2016-11-16 ENCOUNTER — Other Ambulatory Visit: Payer: Self-pay | Admitting: *Deleted

## 2016-11-16 MED ORDER — MONTELUKAST SODIUM 10 MG PO TABS: ORAL_TABLET | ORAL | 4 refills | Status: DC

## 2016-12-22 ENCOUNTER — Other Ambulatory Visit: Payer: Self-pay | Admitting: Allergy & Immunology

## 2016-12-22 NOTE — Telephone Encounter (Signed)
Patient called requesting a new medication. She saw Dr. Ernst Bowler on 10-14-16 and has been taking Breo but has also taken Advair and she says Advair works better for her and would like to know if that could be called into her pharmacy, which has also changed to [facility] in [place].

## 2016-12-22 NOTE — Telephone Encounter (Signed)
Please advise 

## 2016-12-23 MED ORDER — FLUTICASONE FUROATE-VILANTEROL 200-25 MCG/INH IN AEPB: 1.0000 | INHALATION_SPRAY | Freq: Every day | RESPIRATORY_TRACT | 5 refills | Status: DC

## 2016-12-23 NOTE — Telephone Encounter (Signed)
Spoke to patient and sent in Bero to the pharmacy.

## 2016-12-23 NOTE — Telephone Encounter (Signed)
Reviewed note. She was stable on Advair 500/50 one puff BID. However her insurance stopped providing good coverage for it. She did not want a spacer, therefore we went with Breo. We can either (1) send in Advair 500/50 and she can pay the extra money OR (2) we could send in Advair HFA 230/21 two puffs twice daily with spacer. Please call the patient to see which she prefers.   Thanks, Salvatore Marvel, MD [facility] [place]

## 2017-03-29 ENCOUNTER — Encounter: Payer: Self-pay | Admitting: Allergy & Immunology

## 2017-03-29 ENCOUNTER — Ambulatory Visit (INDEPENDENT_AMBULATORY_CARE_PROVIDER_SITE_OTHER): Payer: Medicare Other | Admitting: Allergy & Immunology

## 2017-03-29 VITALS — BP 134/68 | HR 64 | Temp 97.6°F | Resp 18 | Ht 61.22 in | Wt 184.8 lb

## 2017-03-29 DIAGNOSIS — J449 Chronic obstructive pulmonary disease, unspecified: Secondary | ICD-10-CM | POA: Diagnosis not present

## 2017-03-29 DIAGNOSIS — J3089 Other allergic rhinitis: Secondary | ICD-10-CM | POA: Diagnosis not present

## 2017-03-29 MED ORDER — FLUTICASONE-SALMETEROL 500-50 MCG/DOSE IN AEPB: 1.0000 | INHALATION_SPRAY | Freq: Two times a day (BID) | RESPIRATORY_TRACT | 3 refills | Status: DC

## 2017-03-29 MED ORDER — AZELASTINE HCL 0.15 % NA SOLN: 2.0000 | Freq: Two times a day (BID) | NASAL | 5 refills | Status: DC

## 2017-03-29 NOTE — Progress Notes (Signed)
FOLLOW UP  Date of Service/Encounter:  03/29/17   Assessment:   Asthma-COPD overlap syndrome  Perennial allergic rhinitis   Former smoker - quit in May 2013   Asthma Reportables:  Severity: moderate persistent  Risk: high Control: well controlled   Plan/Recommendations:   1. Asthma-COPD overlap syndrome (HCC) - on supplemental nighttime oxygen - Lung testing looked less stellar than your last visit. - We will change you back to Advair 500/50 one puff twice daily. - Daily controller medication(s): Advair 500/50 one puff twice daily + Singulair 10mg  - Rescue medications: ProAir 4 puffs every 4-6 hours as needed - Asthma control goals:  * Full participation in all desired activities (may need albuterol before activity) * Albuterol use two time or less a week on average (not counting use with activity) * Cough interfering with sleep two time or less a month * Oral steroids no more than once a year * No hospitalizations  2. Perennial allergic rhinitis - Sample of Dymista provided today. - Use two sprays per nostril 1-2 times daily. - Use nasal saline rinses prior to the nose spray. - Continue with antihistamines as needed. - We did discuss allergen immunotherapy as a means of controlling symptoms, but she is fine with medications only.   3. Return in about 4 months (around 07/29/2017).   Subjective:   Marilin Kofman is a 71 y.o. female presenting today for follow up of  Chief Complaint  Patient presents with  . Asthma  . Allergies    Kalena Bai has a history of the following: Patient Active Problem List   Diagnosis Date Noted  . Chronic obstructive pulmonary disease ([place]) 09/28/2016  . Perennial allergic rhinitis 09/28/2016    History obtained from: chart review and patient.  Tammie Yanda was referred by [facility].     Katiya is a 71 y.o. female presenting for a follow up visit. Ms. Depaul was last seen in October 2017. At that  time, she was doing well with albuterol as needed. She was also continued on Advair 500/50 one puff in the morning and 1 puff at night. For her allergic rhinitis, she was continued on an antihistamine as well as Flonase daily. We did discuss immunotherapy as a means of controlling her symptoms, she was doing well medications alone. We did treat her for oral candidiasis at the last visit with Diflucan. She does have COPD and is followed by a pulmonologist.  Since last visit, she has done very well. Her breathing is "pretty good" according to the patient. She was on Advair 500/50, but she entered the doughnut hole late last year and was paying $400 for her Advair. In the interim, she got the [facility] for a short period of time but this did not help the doughnut hole. Currently she remains on the Breo one puff once daily. She feels that the [place] is "alright" but the Advair seems to have stopped her wheezing better than the Breo. She would like to change back to the Advair instead of the Breo. She does think that she has had decreased endurance with the Breo. She also remains on the Singulair 10mg  daily.   She is followed by Dr. Koleen Nimrod [facility] [place]) for her COPD. She is going to see him next on Friday. He manages her O2 prescription. She is on it at night. She had a sleep study that demonstrated abnormalities in her O2 level. She tells me that the detector "lost the signal" for  two hours, which is why they put her on O2. She estimates that she uses the O2 around one time per week at the most. 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 She works as double shift every other week.   Her allergies have been well controlled at this time. Spring is the worst time of the year for her. She uses the Flonase only during the worst seasons; she is starting back on it soon. She does use Zyrtec only as needed. She also thinks  that she was on another prescription nasal spray but she cannot remember the name at this time. She does have Dymista listed in her April 2017 note. She is happy with her current treatment plan.   Otherwise, there have been no changes to her past medical history, surgical history, family history, or social history.    Review of Systems: a 14-point review of systems is pertinent for what is mentioned in HPI.  Otherwise, all other systems were negative. Constitutional: negative other than that listed in the HPI Eyes: negative other than that listed in the HPI Ears, nose, mouth, throat, and face: negative other than that listed in the HPI Respiratory: negative other than that listed in the HPI Cardiovascular: negative other than that listed in the HPI Gastrointestinal: negative other than that listed in the HPI Genitourinary: negative other than that listed in the HPI Integument: negative other than that listed in the HPI Hematologic: negative other than that listed in the HPI Musculoskeletal: negative other than that listed in the HPI Neurological: negative other than that listed in the HPI Allergy/Immunologic: negative other than that listed in the HPI    Objective:   Blood pressure 134/68, pulse 64, temperature 97.6 F (36.4 C), temperature source Oral, resp. rate 18, height 5' 1.22" (1.555 m), weight 184 lb 12.8 oz (83.8 kg), SpO2 95 %. Body mass index is 34.67 kg/m.   Physical Exam:  General: Alert, interactive, in no acute distress. Very pleasant obese female.  Eyes: No conjunctival injection present on the right, No conjunctival injection present on the left, PERRL bilaterally, No discharge on the right, No discharge on the left and No Horner-Trantas dots present Ears: There are a couple of bubbles behind the TM on the left side, Right TM pearly gray with normal light reflex, Left TM pearly gray with normal light reflex, Right TM intact without perforation and Left TM intact  without perforation.  Nose/Throat: External nose within normal limits and septum midline, turbinates edematous and pale without discharge, post-pharynx mildly erythematous without cobblestoning in the posterior oropharynx. Tonsils 2+ without exudates Neck: Supple without thyromegaly. Lungs: Clear to auscultation without wheezing, rhonchi or rales. No increased work of breathing. Decreased air movement at the bases bilaterally. CV: Normal S1/S2, no murmurs. Capillary refill <2 seconds.  Skin: Warm and dry, without lesions or rashes. Neuro:   Grossly intact. No focal deficits appreciated. Responsive to questions.   Diagnostic studies:  Spirometry: results abnormal (FEV1: 0.76/49%, FVC: 0.98/52%, FEV1/FVC: 77%).    Spirometry consistent with possible restrictive disease.   Allergy Studies: none    Salvatore Marvel, MD [facility] [place]

## 2017-03-29 NOTE — Patient Instructions (Addendum)
1. Asthma-COPD overlap syndrome (HCC) - on supplemental nighttime oxygen - Lung testing looked less stellar than your last visit. - We will change you back to Advair 500/50 one puff twice daily. - Daily controller medication(s): Advair 500/50 one puff twice daily + Singulair 10mg  - Rescue medications: ProAir 4 puffs every 4-6 hours as needed - Asthma control goals:  * Full participation in all desired activities (may need albuterol before activity) * Albuterol use two time or less a week on average (not counting use with activity) * Cough interfering with sleep two time or less a month * Oral steroids no more than once a year * No hospitalizations  2. Perennial allergic rhinitis - Sample of Dymista provided today. - Use two sprays per nostril 1-2 times daily. - Use nasal saline rinses prior to the nose spray. - Continue with antihistamines as needed.  3. Return in about 4 months (around 07/29/2017).  Please inform us of any Emergency Department visits, hospitalizations, or changes in symptoms. Call us before going to the ED for breathing or allergy symptoms since we might be able to fit you in for a sick visit. Feel free to contact us anytime with any questions, problems, or concerns.  It was a pleasure to see you again today! Happy spring!   Websites that have reliable patient information: 1. American Academy of Asthma, Allergy, and Immunology: www.aaaai.org 2. Food Allergy Research and Education (FARE): foodallergy.org 3. Mothers of Asthmatics: http://www.asthmacommunitynetwork.org 4. American College of Allergy, Asthma, and Immunology: www.acaai.org

## 2017-06-09 ENCOUNTER — Other Ambulatory Visit: Payer: Self-pay | Admitting: Allergy and Immunology

## 2017-08-02 ENCOUNTER — Ambulatory Visit: Payer: Medicare Other | Admitting: Allergy & Immunology

## 2017-10-25 ENCOUNTER — Ambulatory Visit (INDEPENDENT_AMBULATORY_CARE_PROVIDER_SITE_OTHER): Payer: Medicare Other | Admitting: Allergy & Immunology

## 2017-10-25 ENCOUNTER — Encounter: Payer: Self-pay | Admitting: Allergy & Immunology

## 2017-10-25 VITALS — BP 132/74 | HR 56 | Resp 17

## 2017-10-25 DIAGNOSIS — J3089 Other allergic rhinitis: Secondary | ICD-10-CM

## 2017-10-25 DIAGNOSIS — J449 Chronic obstructive pulmonary disease, unspecified: Secondary | ICD-10-CM | POA: Diagnosis not present

## 2017-10-25 NOTE — Progress Notes (Addendum)
FOLLOW UP  Date of Service/Encounter:  10/25/17   Assessment:   Asthma-COPD overlap syndrome  Perennial allergic rhinitis   Asthma Reportables:  Severity: moderate persistent  Risk: high Control: not well controlled  Plan/Recommendations:   1. Asthma-COPD overlap syndrome (HCC) - on supplemental nighttime oxygen - Lung testing looked less stellar than your last visit. - I think that you need something that you can take twice daily, such as Symbicort - Spacer and Symbicort samples provided.   - I think that Symbicort will be better covered than Debra Advair. - This might just be her baseline, secondary to her cardiac history and her COPD history.  - Daily controller medication(s): Symbicort 160/4.5 two puffs twice daily with spacer + Singulair 10mg  - Rescue medications: ProAir 4 puffs every 4-6 hours as needed - Asthma control goals:  * Full participation in all desired activities (may need albuterol before activity) * Albuterol use two time or less a week on average (not counting use with activity) * Cough interfering with sleep two time or less a month * Oral steroids no more than once a year * No hospitalizations  2. Perennial allergic rhinitis - Sample of Dymista provided today. - Use two sprays per nostril 1-2 times daily. - Use nasal saline rinses prior to Debra nose spray. - Continue with antihistamines as needed.  3. Return in about 2 months (around 12/25/2017).   Subjective:   Debra Debra [name] is a 71 y.o. Debra [name] presenting today for follow up of  Chief Complaint  Patient presents with  . Asthma    Using Breo 200/25, Singulair 10mg , Proair prn  . Allergies    Dymista and Allegra     Debra Debra [name] has a history of Debra following: Patient Active Problem List   Diagnosis Date Noted  . Chronic obstructive pulmonary disease ([place]) 09/28/2016  . Perennial allergic rhinitis 09/28/2016     History obtained from: chart review and patient.  [facility] Provider is Debra Debra [name] is a 71 y.o. Debra [name] presenting for a follow up visit. She was last seen in April 2018. At that time, her lung testing looked worse with moderate restriction, therefore we changed her back to Advair 500/50 one puff BID as well as Singulair 10mg  daily. She is followed by Debra Debra [name] in [place] for her COPD and O2 requirement. For her perennial allergic rhinitis, we provided a sample of Dymista to use and recommended e use of nasal saline rinses. She had been avoiding Advair due to Debra high cost, as she was in Debra doughnut hole. She tried out [employer] but this did not seem to provide adequate relief.   Since Debra last visit, she has mostly done well. She was recently placed on prednisone for arthritis flare in her hands and back. She has been on it for a couple of weeks. This is managed by Debra Debra [name], her Rheumatologist. With regards to her breathing, she has not needed Debra albuterol nebulizer routinely. She uses it less than once per week at Debra most. She remains on Breo one puff once daily. At Debra last visit, she felt that Debra Advair had worked better, but now she reports feeling "pretty good" despite her spirometry which is actually much worse today.   She remains on Debra oxygen at night, but she does not use it routinely. Evidently her O2 dropped at night at some point, which is why she is on O2 during that  time. Debra Debra [name] continues to follow her for Debra oxygen. She is not entirely sure why she is on O2 and admits that she does not use it regularly. Her next appointment with Debra Debra [name] is November 20th.   Nasal symptoms are well controlled for Debra most part, but it goes "beserk" every so often. She remains on Debra Flonase mostly, but will change to Dymista during Debra worst parts of Debra year. She does use nasal saline rinses as well. She is happy with this regimen.   She does have a significant cardiac history and required 2 bypass  surgeries in 2013. She is seen by Debra Debra [name], who is a cardiologist at [facility] in [place]. She lives at home with her husband. She does have grandchildren. 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  Otherwise, there have been no changes to her past medical history, surgical history, family history, or social history.    Review of Systems: a 14-point review of systems is pertinent for what is mentioned in HPI.  Otherwise, all other systems were negative. Constitutional: negative other than that listed in Debra HPI Eyes: negative other than that listed in Debra HPI Ears, nose, mouth, throat, and face: negative other than that listed in Debra HPI Respiratory: negative other than that listed in Debra HPI Cardiovascular: negative other than that listed in Debra HPI Gastrointestinal: negative other than that listed in Debra HPI Genitourinary: negative other than that listed in Debra HPI Integument: negative other than that listed in Debra HPI Hematologic: negative other than that listed in Debra HPI Musculoskeletal: negative other than that listed in Debra HPI Neurological: negative other than that listed in Debra HPI Allergy/Immunologic: negative other than that listed in Debra HPI    Objective:   Blood pressure 132/74, pulse (!) 56, resp. rate 17, SpO2 95 %. There is no height or weight on file to calculate BMI.   Physical Exam:  General: Alert, interactive, in no acute distress. Pleasant Debra [name]. Comfortable appearing.  Eyes: No conjunctival injection bilaterally, no discharge on Debra right, no discharge on Debra left and no Horner-Trantas dots present. PERRL bilaterally. EOMI without pain. No photophobia.  Ears: Right TM pearly gray with normal light reflex, Left TM pearly gray with normal light reflex, Right TM intact without perforation and Left TM intact without perforation.  Nose/Throat: External nose within normal limits and  septum midline. Turbinates edematous and pale without discharge. Posterior oropharynx mildly erythematous without cobblestoning in Debra posterior oropharynx. Tonsils 2+ without exudates.  Tongue without thrush. Adenopathy: shoddy bilateral anterior cervical lymphadenopathy and no enlarged lymph nodes appreciated in Debra occipital, axillary, epitrochlear, inguinal, or popliteal regions. Lungs: Decreased breath sounds bilaterally without wheezing, rhonchi or rales. No increased work of breathing. CV: Normal S1/S2. No murmurs. Capillary refill <2 seconds.  Skin: Warm and dry, without lesions or rashes. Neuro:   Grossly intact. No focal deficits appreciated. Responsive to questions.  Diagnostic studies:  Spirometry: results abnormal (FEV1: 0.72/48%, FVC: 1.04/57%, FEV1/FVC: 68%).    Spirometry consistent with possible restrictive disease. Albuterol/Atrovent nebulizer treatment given in clinic with significant improvement in FEV1 and FVC per ATS criteria. Her FEV1 increased 20% and Debra FVC increased 54%.      Date FVC FEV1 FEV1/FVC  11/18/2015 1.04 0.8 0.77  01/22/2016 1.08 0.85 0.79  03/23/2016 1 0.75 0.75  09/28/2016 1.56 0.81 0.52  09/22/2017 0.98 0.76 0.78  10/25/2017 1.04 0.72 0.69  Allergy Studies: none     Salvatore Marvel, MD [facility] [place]

## 2017-10-25 NOTE — Patient Instructions (Addendum)
1. Asthma-COPD overlap syndrome (HCC) - on supplemental nighttime oxygen - Lung testing looked less stellar than your last visit. - I think that you need something that you can take twice daily, such as Symbicort - Spacer and Symbicort samples provided.   - I think that Symbicort will be better covered than the Advair.  - Daily controller medication(s): Symbicort 160/4.5 two puffs twice daily with spacer + Singulair 10mg  - Rescue medications: ProAir 4 puffs every 4-6 hours as needed - Asthma control goals:  * Full participation in all desired activities (may need albuterol before activity) * Albuterol use two time or less a week on average (not counting use with activity) * Cough interfering with sleep two time or less a month * Oral steroids no more than once a year * No hospitalizations  2. Perennial allergic rhinitis - Sample of Dymista provided today. - Use two sprays per nostril 1-2 times daily. - Use nasal saline rinses prior to the nose spray. - Continue with antihistamines as needed.  3. Return in about 2 months (around 12/25/2017).   Please inform us of any Emergency Department visits, hospitalizations, or changes in symptoms. Call us before going to the ED for breathing or allergy symptoms since we might be able to fit you in for a sick visit. Feel free to contact us anytime with any questions, problems, or concerns.  It was a pleasure to see you again today! Enjoy the fall season!  Websites that have reliable patient information: 1. American Academy of Asthma, Allergy, and Immunology: www.aaaai.org 2. Food Allergy Research and Education (FARE): foodallergy.org 3. Mothers of Asthmatics: http://www.asthmacommunitynetwork.org 4. American College of Allergy, Asthma, and Immunology: www.acaai.org   Election Day is coming up on Tuesday, November 6th! Make your voice heard! Polls are open from 6:30am until 7:30pm!    Find your polling place: [email or web address]  If you are turned away  at the polls, you have the right to request a provisional ballot, which is required by law!

## 2017-10-30 ENCOUNTER — Other Ambulatory Visit: Payer: Self-pay | Admitting: Allergy and Immunology

## 2017-10-31 ENCOUNTER — Other Ambulatory Visit: Payer: Self-pay | Admitting: Allergy & Immunology

## 2017-11-21 ENCOUNTER — Telehealth: Payer: Self-pay | Admitting: Allergy & Immunology

## 2017-11-21 MED ORDER — BENZONATATE 200 MG PO CAPS
200.0000 mg | ORAL_CAPSULE | Freq: Three times a day (TID) | ORAL | 0 refills | Status: DC | PRN
Start: 2017-11-21 — End: 2017-12-19

## 2017-11-21 NOTE — Telephone Encounter (Signed)
Called and informed patient that prescription was sent in.

## 2017-11-21 NOTE — Telephone Encounter (Signed)
Pt called and was seen a medic center and need to see if you will send in cough syrup fpr her cough. She is taking amox. predisone , and mus. Dm [facility] [address] [phone].

## 2017-11-21 NOTE — Telephone Encounter (Signed)
Reviewed note. Sent in [place]. Please call patient to let her know.   Salvatore Marvel, MD [facility] [place]

## 2017-12-19 ENCOUNTER — Emergency Department (HOSPITAL_COMMUNITY)
Admission: EM | Admit: 2017-12-19 | Discharge: 2017-12-19 | Disposition: A | Discharge status: Home / Self Care | Payer: Medicare Other | Attending: Emergency Medicine | Admitting: Emergency Medicine

## 2017-12-19 ENCOUNTER — Emergency Department (HOSPITAL_COMMUNITY): Payer: Medicare Other

## 2017-12-19 ENCOUNTER — Other Ambulatory Visit: Payer: Self-pay

## 2017-12-19 ENCOUNTER — Encounter (HOSPITAL_COMMUNITY): Payer: Self-pay | Admitting: Emergency Medicine

## 2017-12-19 DIAGNOSIS — Z951 Presence of aortocoronary bypass graft: Secondary | ICD-10-CM | POA: Diagnosis not present

## 2017-12-19 DIAGNOSIS — Z9049 Acquired absence of other specified parts of digestive tract: Secondary | ICD-10-CM | POA: Diagnosis not present

## 2017-12-19 DIAGNOSIS — I10 Essential (primary) hypertension: Secondary | ICD-10-CM | POA: Insufficient documentation

## 2017-12-19 DIAGNOSIS — J40 Bronchitis, not specified as acute or chronic: Secondary | ICD-10-CM | POA: Diagnosis not present

## 2017-12-19 DIAGNOSIS — J449 Chronic obstructive pulmonary disease, unspecified: Secondary | ICD-10-CM | POA: Diagnosis not present

## 2017-12-19 DIAGNOSIS — Z7982 Long term (current) use of aspirin: Secondary | ICD-10-CM | POA: Diagnosis not present

## 2017-12-19 DIAGNOSIS — Z87891 Personal history of nicotine dependence: Secondary | ICD-10-CM | POA: Insufficient documentation

## 2017-12-19 DIAGNOSIS — E119 Type 2 diabetes mellitus without complications: Secondary | ICD-10-CM | POA: Insufficient documentation

## 2017-12-19 DIAGNOSIS — R062 Wheezing: Secondary | ICD-10-CM | POA: Diagnosis present

## 2017-12-19 DIAGNOSIS — Z79899 Other long term (current) drug therapy: Secondary | ICD-10-CM | POA: Diagnosis not present

## 2017-12-19 LAB — CBC
HCT: 37 % (ref 36.0–46.0)
Hemoglobin: 11.9 g/dL — ABNORMAL LOW (ref 12.0–15.0)
MCH: 29 pg (ref 26.0–34.0)
MCHC: 32.2 g/dL (ref 30.0–36.0)
MCV: 90.2 fL (ref 78.0–100.0)
PLATELETS: 325 10*3/uL (ref 150–400)
RBC: 4.1 MIL/uL (ref 3.87–5.11)
RDW: 14.8 % (ref 11.5–15.5)
WBC: 11.2 10*3/uL — AB (ref 4.0–10.5)

## 2017-12-19 LAB — BASIC METABOLIC PANEL
Anion gap: 10 (ref 5–15)
BUN: 19 mg/dL (ref 6–20)
CALCIUM: 9 mg/dL (ref 8.9–10.3)
CHLORIDE: 103 mmol/L (ref 101–111)
CO2: 30 mmol/L (ref 22–32)
CREATININE: 1.28 mg/dL — AB (ref 0.44–1.00)
GFR, EST AFRICAN AMERICAN: 48 mL/min — AB (ref >60)
GFR, EST NON AFRICAN AMERICAN: 42 mL/min — AB (ref >60)
Glucose, Bld: 139 mg/dL — ABNORMAL HIGH (ref 65–99)
Potassium: 4 mmol/L (ref 3.5–5.1)
SODIUM: 143 mmol/L (ref 135–145)

## 2017-12-19 MED ORDER — BENZONATATE 100 MG PO CAPS: 100.0000 mg | ORAL_CAPSULE | Freq: Three times a day (TID) | ORAL | 0 refills | Status: DC

## 2017-12-19 MED ORDER — AZITHROMYCIN 250 MG PO TABS: 250.0000 mg | ORAL_TABLET | Freq: Every day | ORAL | 0 refills | Status: DC

## 2017-12-19 MED ORDER — IPRATROPIUM-ALBUTEROL 0.5-2.5 (3) MG/3ML IN SOLN
3.0000 mL | Freq: Once | RESPIRATORY_TRACT | Status: AC
  Administered 2017-12-19: 3 mL via RESPIRATORY_TRACT
  Filled 2017-12-19: qty 3

## 2017-12-19 NOTE — Telephone Encounter (Signed)
Please advise 

## 2017-12-19 NOTE — Telephone Encounter (Signed)
Patient has called back Patient has an upcoming appt on 01/08 in [place] Patient is NOT feeling any better Has been to urgent care and the ER Has been found to have fluid on her lungs Wants to know what she can do, because she keeps having these flares and the urgent care and ER has given her different medications Please call patient to answer any questions Does she need to be seen sooner??

## 2017-12-19 NOTE — ED Provider Notes (Signed)
[facility] EMERGENCY DEPARTMENT Provider Note   CSN: [id number] Arrival date & time: 12/19/17  1429     History   Chief Complaint Chief Complaint  Patient presents with  . Cough    HPI Debra [name] is a 71 y.o. female.  HPI  The patient is a 71 year old female with a known history of COPD, reactive airway disease, diabetes and hyperlipidemia, she has had recurrent upper respiratory infections and in fact over the last couple of months has had multiple evaluations including 2 visits to the urgent care, and emergency department visit and finally comes in tonight because of ongoing recurrent wheezing coughing and shortness of breath.  The patient reports that she has been told that she had COPD exacerbations, another doctor told her that she had some pulmonary edema, she talked about this with her cardiologist who said that she did not in fact have any cardiomegaly and had nothing to be concerned about with that and yet she comes in today with ongoing worsening cough.  She feels like there is sputum in the lungs that she cannot clear, she has been using albuterol treatments with some relief, she has been through a course of prednisone as well as amoxicillin and feels like it still comes back even though she has long periods where she is feeling better.  There is no swelling of the legs, no fevers or chills, she does have some shortness of breath and even though she is supposed to use her oxygen at night she hardly ever uses it.  Recently she did put it on thinking that might help.  Does not smoke cigarettes anymore.  Past Medical History:  Diagnosis Date  . Allergic rhinitis   . Asthma   . COPD (chronic obstructive pulmonary disease) ([place])   . Diabetes mellitus without complication ([place])   . Hyperlipidemia   . Hypertension   . Recurrent upper respiratory infection (URI)     Patient Active Problem List   Diagnosis Date Noted  . Chronic obstructive pulmonary disease ([place]) 09/28/2016    . Perennial allergic rhinitis 09/28/2016    Past Surgical History:  Procedure Laterality Date  . CHOLECYSTECTOMY    . CORONARY ARTERY BYPASS GRAFT    . PARTIAL HYSTERECTOMY  1981    OB History    No data available       Home Medications    Prior to Admission medications   Medication Sig Start Date End Date Taking? Authorizing Provider  albuterol (PROVENTIL HFA;VENTOLIN HFA) 108 (90 Base) MCG/ACT inhaler Inhale 2 puffs into the lungs every 4 (four) hours as needed for wheezing or shortness of breath (cough).    [provider]  albuterol (PROVENTIL) (2.5 MG/3ML) 0.083% nebulizer solution Take 2.5 mg by nebulization every 4 (four) hours as needed for wheezing or shortness of breath (cough).    [provider]  amLODipine (NORVASC) 5 MG tablet Take 5 mg by mouth daily.    [provider]  aspirin 81 MG tablet Take 81 mg by mouth daily.    [provider]  atorvastatin (LIPITOR) 80 MG tablet Take 80 mg by mouth daily.    [provider]  Azelastine HCl 0.15 % SOLN Place 2 sprays into both nostrils 2 (two) times daily. 03/29/17   Valentina Shaggy, MD  azithromycin (ZITHROMAX Z-PAK) 250 MG tablet Take 1 tablet (250 mg total) by mouth daily. 500mg  PO day 1, then 250mg  PO days 205 12/19/17   Noemi Chapel, MD  benzonatate [facility])  100 MG capsule Take 1 capsule (100 mg total) by mouth every 8 (eight) hours. 12/19/17   Noemi Chapel, MD  BREO ELLIPTA [phone] MCG/INH AEPB INHALE 1 PUFF BY MOUTH DAILY 10/31/17   Valentina Shaggy, MD  co-enzyme Q-10 30 MG capsule Take 100 mg by mouth daily.    [provider]  gabapentin (NEURONTIN) 300 MG capsule 300 mg 3 (three) times daily.  01/14/16   [provider]  lisinopril (PRINIVIL,ZESTRIL) 10 MG tablet Take 10 mg by mouth daily.    [provider]  metoprolol (LOPRESSOR) 100 MG tablet  03/13/17   [provider]  montelukast (SINGULAIR) 10 MG tablet TAKE 1  TABLET BY MOUTH EVERY DAY 10/31/17   Valentina Shaggy, MD    Family History Family History  Problem Relation Age of Onset  . Allergic rhinitis Neg Hx   . Asthma Neg Hx   . Eczema Neg Hx   . Immunodeficiency Neg Hx   . Urticaria Neg Hx     Social History Social History   Tobacco Use  . Smoking status: Former Smoker    Last attempt to quit: 05/19/2012    Years since quitting: 5.5  . Smokeless tobacco: Never Used  Substance Use Topics  . Alcohol use: No    Alcohol/week: 0.0 oz  . Drug use: No     Allergies   Aspirin   Review of Systems Review of Systems  All other systems reviewed and are negative.    Physical Exam Updated Vital Signs BP (!) 144/76 (BP Location: Right Arm)   Pulse 66   Temp 98.4 F (36.9 C) (Oral)   Resp 18   Ht 5\' 2"  (1.575 m)   Wt 78.5 kg (173 lb)   SpO2 95%   BMI 31.64 kg/m   Physical Exam  Constitutional: She appears well-developed and well-nourished. No distress.  HENT:  Head: Normocephalic and atraumatic.  Mouth/Throat: Oropharynx is clear and moist. No oropharyngeal exudate.  Eyes: Conjunctivae and EOM are normal. Pupils are equal, round, and reactive to light. Right eye exhibits no discharge. Left eye exhibits no discharge. No scleral icterus.  Neck: Normal range of motion. Neck supple. No JVD present. No thyromegaly present.  Cardiovascular: Normal rate, regular rhythm, normal heart sounds and intact distal pulses. Exam reveals no gallop and no friction rub.  No murmur heard. No JVD, normal pulses at the radial arteries, no tachycardia  Pulmonary/Chest: Effort normal. No respiratory distress. She has wheezes ( Mild expiratory wheezing in the posterior lung fields). She has no rales.  Rhonchi present, the patient is unable to clear mucus very well however there is no rales, no increased work of breathing, no accessory muscle use, the patient speaks in full sentences with only occasional coughing spell  Abdominal: Soft. Bowel  sounds are normal. She exhibits no distension and no mass. There is no tenderness.  Musculoskeletal: Normal range of motion. She exhibits no edema or tenderness.  There is no peripheral edema  Lymphadenopathy:    She has no cervical adenopathy.  Neurological: She is alert. Coordination normal.  Skin: Skin is warm and dry. No rash noted. No erythema.  Psychiatric: She has a normal mood and affect. Her behavior is normal.  Nursing note and vitals reviewed.    ED Treatments / Results  Labs (all labs ordered are listed, but only abnormal results are displayed) Labs Reviewed  BASIC METABOLIC PANEL - Abnormal; Notable for the following components:      Result  Value   Glucose, Bld 139 (*)    Creatinine, Ser 1.28 (*)    GFR calc non Af Amer 42 (*)    GFR calc Af Amer 48 (*)    All other components within normal limits  CBC - Abnormal; Notable for the following components:   WBC 11.2 (*)    Hemoglobin 11.9 (*)    All other components within normal limits    EKG  EKG Interpretation None       Radiology Dg Chest 2 View  Result Date: 12/19/2017 CLINICAL DATA:  Productive cough and shortness of breath for 1 month. EXAM: CHEST  2 VIEW COMPARISON:  None. FINDINGS: The heart is enlarged. Mild pulmonary vascular congestion is present without frank edema. No focal airspace disease is present. There are no significant effusions. The lungs are hyperinflated. The patient is status post median sternotomy. The visualized soft tissues and bony thorax are otherwise unremarkable. IMPRESSION: 1. Cardiomegaly and mild pulmonary vascular congestion without frank edema. 2. No focal airspace disease to suggest pneumonia. Electronically Signed   By: San Morelle M.D.   On: 12/19/2017 18:31    Procedures Procedures (including critical care time)  Medications Ordered in ED Medications  ipratropium-albuterol (DUONEB) 0.5-2.5 (3) MG/3ML nebulizer solution 3 mL (3 mLs Nebulization Given 12/19/17  1900)     Initial Impression / Assessment and Plan / ED Course  I have reviewed the triage vital signs and the nursing notes.  Pertinent labs & imaging results that were available during my care of the patient were reviewed by me and considered in my medical decision making (see chart for details).    The patient's x-ray shows no signs of significant edema, there is no pneumonia, she has a minimal leukocytosis but having been on steroids that is not surprising and her renal function is such that the creatinine is 1.28.  All of these things yield a definitive diagnosis but I suspect she has ongoing reactive airway disease which may or may not be related to a viral source.  It does not appear to be pneumonia, she is not febrile tachycardic nor is she hypoxic however given the length of her symptoms it may beg the question as to whether an antibiotic may help reduce this.  She was not treated for atypical infections and may benefit from a macrolide.  X-ray labs reviewed, no specific findings, will send the patient home on the above medications, she is in agreement with the plan.  Vitals:   12/19/17 1855 12/19/17 1856 12/19/17 1901 12/19/17 2041  BP: (!) 142/77 (!) 142/77  (!) 144/76  Pulse:  65  66  Resp:  17  18  Temp:      TempSrc:      SpO2:  96% 94% 95%  Weight:      Height:         Final Clinical Impressions(s) / ED Diagnoses   Final diagnoses:  Bronchitis    ED Discharge Orders        Ordered    benzonatate (TESSALON) 100 MG capsule  Every 8 hours     12/19/17 2049    azithromycin (ZITHROMAX Z-PAK) 250 MG tablet  Daily     12/19/17 2049       Noemi Chapel, MD 12/19/17 2052

## 2017-12-19 NOTE — ED Notes (Signed)
Have paged respiratory  

## 2017-12-19 NOTE — ED Notes (Signed)
Pt denies any complaints.  Updated on wait time.  Verbalized understanding.

## 2017-12-19 NOTE — ED Triage Notes (Addendum)
Pt reports has been seen at urgent care, [facility] ED, and [facility] for same. Pt reports was told had COPD and fluid on lungs. nad noted. Pt denies chest pain. No shortness of breath noted at time of arrival. Pt reports used neb treatments at home with no relief.

## 2017-12-19 NOTE — Discharge Instructions (Signed)
Your testing today shows that you have a x-ray of your chest which is normal with no signs of pneumonia, your lab work is also been normal, you may take the medications as prescribed including the following  1.  Tessalon 100 mg by mouth every 8 hours as needed for coughing  2.  Albuterol inhaler every 4 hours as needed for shortness of breath  3.  Zithromax daily for 5 days  Seek medical attention for severe or worsening difficulty breathing chest pain fevers or coughing.

## 2017-12-21 ENCOUNTER — Encounter: Payer: Self-pay | Admitting: Allergy & Immunology

## 2017-12-21 ENCOUNTER — Ambulatory Visit (INDEPENDENT_AMBULATORY_CARE_PROVIDER_SITE_OTHER): Payer: Medicare Other | Admitting: Allergy & Immunology

## 2017-12-21 VITALS — BP 130/74 | HR 73 | Temp 98.7°F | Resp 17

## 2017-12-21 DIAGNOSIS — J449 Chronic obstructive pulmonary disease, unspecified: Secondary | ICD-10-CM | POA: Diagnosis not present

## 2017-12-21 DIAGNOSIS — J3089 Other allergic rhinitis: Secondary | ICD-10-CM

## 2017-12-21 MED ORDER — GUAIFENESIN-CODEINE 200-10 MG/5ML PO LIQD: 10.0000 mL | ORAL | 0 refills | Status: DC | PRN

## 2017-12-21 MED ORDER — HYDROCOD POLST-CPM POLST ER 10-8 MG/5ML PO SUER: 5.0000 mL | Freq: Two times a day (BID) | ORAL | 0 refills | Status: DC | PRN

## 2017-12-21 NOTE — Patient Instructions (Addendum)
1. Asthma-COPD overlap syndrome - with recent asthma exacerbation - Lung function looked fairly stable today. - I do no think that more steroids are needed at this point. - We will add on Tussionex cough medicine (44mL every 12 hours as needed) to see if this can help you get some sleep and recover. - We will also add on Asmanex 268mcg two puffs twice daily for the next week to provide some localized anti-inflammatory effect on your lungs.  - Call us at the end of the week with an update. - Daily controller medication(s): Symbicort 160/4.74mcg two puffs twice daily with spacer - Prior to physical activity: ProAir 2 puffs 10-15 minutes before physical activity. - Rescue medications: ProAir 4 puffs every 4-6 hours as needed or albuterol nebulizer one vial puffs every 4-6 hours as needed - Changes during respiratory infections or worsening symptoms: Add on Asmanex 285mcg to 2 puffs twice daily for TWO WEEKS. - Asthma control goals:  * Full participation in all desired activities (may need albuterol before activity) * Albuterol use two time or less a week on average (not counting use with activity) * Cough interfering with sleep two time or less a month * Oral steroids no more than once a year * No hospitalizations  2. Return in about 3 months (around 03/21/2018).   Please inform us of any Emergency Department visits, hospitalizations, or changes in symptoms. Call us before going to the ED for breathing or allergy symptoms since we might be able to fit you in for a sick visit. Feel free to contact us anytime with any questions, problems, or concerns.  It was a pleasure to see you again today! Happy New Year!   Websites that have reliable patient information: 1. American Academy of Asthma, Allergy, and Immunology: www.aaaai.org 2. Food Allergy Research and Education (FARE): foodallergy.org 3. Mothers of Asthmatics: http://www.asthmacommunitynetwork.org 4. American College of Allergy, Asthma, and  Immunology: www.acaai.org

## 2017-12-21 NOTE — Telephone Encounter (Signed)
Patient placed on the schedule for today.

## 2017-12-21 NOTE — Telephone Encounter (Signed)
I called patient and she reports that she went to the ED on New Years Eve. She did feel better following that visit, but now her cough has returned. Tessalon pearls do not seem to be helping. She is available today for an appointment and we will see her at 3:15pm. I will have the front desk schedule the appointment.   Salvatore Marvel, MD [facility] [place]

## 2017-12-21 NOTE — Progress Notes (Signed)
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 - I do no think that more steroids are needed at this point. - We will add on Tussionex cough medicine (32mL every 12 hours as needed) to see if this can help you get some sleep and recover. - We will also add on Asmanex 233mcg two puffs twice daily for the next week to provide some localized anti-inflammatory effect on your lungs.  - Call us at the end of the week with an update. - Daily controller medication(s): Symbicort 160/4.11mcg two puffs twice daily with spacer - Prior to physical activity: ProAir 2 puffs 10-15 minutes before physical activity. - Rescue medications: ProAir 4 puffs every 4-6 hours as needed or albuterol nebulizer one vial puffs every 4-6 hours as needed - Changes during respiratory infections or worsening symptoms: Add on Asmanex 259mcg to 2 puffs twice daily for TWO WEEKS. - Asthma control goals:  * Full participation in all desired activities (may need albuterol before activity) * Albuterol use two time or less a week on average (not counting use with activity) * Cough interfering with sleep two time or less a month * Oral steroids no more than once a year * No hospitalizations  2. Return in about 3 months (around 03/21/2018).  Subjective:   Gregoria Selvy is a 72 y.o. female presenting today for follow up of  Chief Complaint  Patient presents with  . Cough  . Asthma    Laquanna Kealey has a history of the following: Patient Active Problem List   Diagnosis Date Noted  . Chronic obstructive pulmonary disease ([place]) 09/28/2016  . Perennial allergic rhinitis 09/28/2016    History obtained from: chart review and patient.  [place] Primary Care Provider is  The [place] is a 71 y.o. female presenting for a sick visit. She was last seen in November 2018. At that time, we changed her to Symbicort 160/4.5 two puffs BID. Prior to this, she was on Advair 500/50 one puff BID and she had tried Breo 200/25 without relief. We provided a sample of Dymista to use for her rhinitis. She was also instructed to continue her use of antihistamine daily as well as nasal saline rinses. She is followed by Dr. Koleen Nimrod in [place] for her COPD and O2 requirement.   In the interim, she did call at the beginning of December due to persistent cough. She had already been treated with a prednisone taper as well as amoxicillin. We sent in tessalon pearls. However, this provided minimal relief. She then went to the Urgent Care and then the ED on New Year's Eve and received azithromycin and nebulizer treatments. CXR showed mild pulmonary congestion without frank edema. There was no evidence of consolidation suggestive of pneumonia. She had discussed these findings with her Cardiologist who felt that pulmonary edema was not a concern since her heart was normal sized.   Since the last visit, she continues to have problems with coughing. She is s/p two courses of antibiotics, multiple prednisone courses (at least two since these symptoms started). Symptoms have rebounded, but they are worse in the middle of the night. She remains on her Symbicort. She does have a nebulizer at home. She has not been  continuing with her oxygen at night aside from some coughing spells. She does have a pulse ox at home. Oxygenation has ranged from 93% and up. Her baseline seems to be around 95%. She has been having continuous rhinorrhea issues.   Her pulmonologist is Dr. Koleen Nimrod in [place], [place]. She is on oxygen at night for episodes of hypoxia, although she is not entirely compliant with this. She has not been on her oxygen in quite some time. 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 She is seen by Dr. Burney Gauze, who is a cardiologist at [facility] in [place]. She lives at home with her husband. She does have grandchildren. 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  Otherwise, there have been no changes to her past medical history, surgical history, family history, or social history.    Review of Systems: a 14-point review of systems is pertinent for what is mentioned in HPI.  Otherwise, all other systems were negative. Constitutional: negative other than that listed in the HPI Eyes: negative other than that listed in the HPI Ears, nose, mouth, throat, and face: negative other than that listed in the HPI Respiratory: negative other than that listed in the HPI Cardiovascular: negative other than that listed in the HPI Gastrointestinal: negative other than that listed in the HPI Genitourinary: negative other than that listed in the HPI Integument: negative other than that listed in the HPI Hematologic: negative other than that listed in the HPI Musculoskeletal: negative other than that listed in the HPI Neurological: negative other than that listed in the HPI Allergy/Immunologic: negative other than that listed in the HPI    Objective:   Blood pressure 130/74, pulse 73, temperature 98.7 F (37.1 C), resp. rate 17, SpO2 93 %. There is no height or weight on file to calculate BMI.   Physical Exam:  General: Alert, interactive, in no acute distress. Pleasant but with a dry hacking cough during the visit.  Eyes: No conjunctival injection bilaterally, no discharge on the right, no discharge on the left and no Horner-Trantas dots present. PERRL bilaterally. EOMI without pain. No photophobia.  Ears: Right TM pearly gray with normal light reflex, Left TM pearly gray with normal light reflex, Right TM intact without perforation  and Left TM intact without perforation.  Nose/Throat: External nose within normal limits and septum midline. Turbinates edematous with scant epistaxis and clear discharge. Posterior oropharynx mildly erythematous without cobblestoning in the posterior oropharynx. Tonsils 2+ without exudates.  Tongue without thrush. Adenopathy: no enlarged lymph nodes appreciated in the anterior cervical, occipital, axillary, epitrochlear, inguinal, or popliteal regions. Lungs: Decreased breath sounds bilaterally without wheezing, rhonchi or rales. No increased work of breathing. CV: Normal S1/S2. No murmurs. Capillary refill <2 seconds.  Skin: Warm and dry, without lesions or rashes. Neuro:   Grossly intact. No focal deficits appreciated. Responsive to questions.  Diagnostic studies:   Spirometry: Normal FEV1, FVC, and FEV1/FVC ratio. There is no scooping suggestive of obstructive disease.   Allergy Studies: none   Salvatore Marvel, MD [facility] [place]

## 2017-12-26 MED ORDER — GUAIFENESIN-CODEINE 200-10 MG/5ML PO LIQD: 10.0000 mL | ORAL | 0 refills | Status: DC | PRN

## 2017-12-26 NOTE — Addendum Note (Signed)
Addended by: Valentina Shaggy on: 12/26/2017 09:04 AM   Modules accepted: Orders

## 2017-12-27 ENCOUNTER — Ambulatory Visit: Payer: Medicare Other | Admitting: Allergy & Immunology

## 2018-04-04 ENCOUNTER — Ambulatory Visit (INDEPENDENT_AMBULATORY_CARE_PROVIDER_SITE_OTHER): Payer: Medicare Other | Admitting: Allergy & Immunology

## 2018-04-04 ENCOUNTER — Encounter: Payer: Self-pay | Admitting: Allergy & Immunology

## 2018-04-04 VITALS — BP 146/80 | HR 60 | Temp 98.1°F | Resp 18

## 2018-04-04 DIAGNOSIS — J449 Chronic obstructive pulmonary disease, unspecified: Secondary | ICD-10-CM

## 2018-04-04 DIAGNOSIS — J3089 Other allergic rhinitis: Secondary | ICD-10-CM | POA: Diagnosis not present

## 2018-04-04 MED ORDER — TIOTROPIUM BROMIDE MONOHYDRATE 1.25 MCG/ACT IN AERS: 2.0000 | INHALATION_SPRAY | Freq: Every day | RESPIRATORY_TRACT | 5 refills | Status: DC

## 2018-04-04 NOTE — Patient Instructions (Addendum)
1. Asthma-COPD overlap syndrome - Lung function looked worse today, but it did improve with albuterol. - We will add on Spiriva two puffs once daily to see if this can help keep your lung function stable. - We will also get a CBC and an IgE level to see if you qualify for one of our injectable medications (Xolair, Nucala, or Fasenra).  - Daily controller medication(s): Symbicort 160/4.29mcg two puffs twice daily with spacer - Prior to physical activity: ProAir 2 puffs 10-15 minutes before physical activity. - Rescue medications: ProAir 4 puffs every 4-6 hours as needed - Changes during respiratory infections or worsening symptoms: Add on Asmanex 241mcg to 2 puffs twice daily for ONE TO TWO WEEKS. - Asthma control goals:  * Full participation in all desired activities (may need albuterol before activity) * Albuterol use two time or less a week on average (not counting use with activity) * Cough interfering with sleep two time or less a month * Oral steroids no more than once a year * No hospitalizations  2. Perennial allergic rhinitis - Continue with Allegra one tablet 1-2 times daily as needed. - Continue with nasal saline rinses as needed.  3. Return in about 6 months (around 10/04/2018).   Please inform us of any Emergency Department visits, hospitalizations, or changes in symptoms. Call us before going to the ED for breathing or allergy symptoms since we might be able to fit you in for a sick visit. Feel free to contact us anytime with any questions, problems, or concerns.  It was a pleasure to see you again today!  Websites that have reliable patient information: 1. American Academy of Asthma, Allergy, and Immunology: www.aaaai.org 2. Food Allergy Research and Education (FARE): foodallergy.org 3. Mothers of Asthmatics: http://www.asthmacommunitynetwork.org 4. American College of Allergy, Asthma, and Immunology: www.acaai.org

## 2018-04-04 NOTE — Progress Notes (Signed)
FOLLOW UP  Date of Service/Encounter:  04/04/18   Assessment:   Asthma-COPD overlap syndrome - with worsening spirometry today  Perennial allergic rhinitis  Plan/Recommendations:   1. Asthma-COPD overlap syndrome - Lung function looked worse today, but it did improve with albuterol. - We will add on Spiriva two puffs once daily to see if this can help keep your lung function stable. - We will also get a CBC and an IgE level to see if you qualify for one of our injectable medications (Xolair, Nucala, or Fasenra).  - Daily controller medication(s): Symbicort 160/4.50mcg two puffs twice daily with spacer - Prior to physical activity: ProAir 2 puffs 10-15 minutes before physical activity. - Rescue medications: ProAir 4 puffs every 4-6 hours as needed - Changes during respiratory infections or worsening symptoms: Add on Asmanex 269mcg to 2 puffs twice daily for ONE TO TWO WEEKS. - Asthma control goals:  * Full participation in all desired activities (may need albuterol before activity) * Albuterol use two time or less a week on average (not counting use with activity) * Cough interfering with sleep two time or less a month * Oral steroids no more than once a year * No hospitalizations - Since she has remained stable off of her oxygen for so long, I think it is OK to continue to remain off of it, however I deferred to Dr. Koleen [name] for complete clearance since I am not trained in internal medicine.    2. Perennial allergic rhinitis - Continue with Allegra one tablet 1-2 times daily as needed. - Continue with nasal saline rinses as needed.  3. Return in about 6 months (around 10/04/2018).  Subjective:   Debra [name] is a 72 y.o. female presenting today for follow up of  Chief Complaint  Patient presents with  . Asthma    doing much better since last seen. no problems with asthma or allergies.     Debra [name] has a history of the following: Patient Active Problem List   Diagnosis Date Noted  . Chronic obstructive pulmonary disease ([place]) 09/28/2016  . Perennial allergic rhinitis 09/28/2016    History obtained from: chart review and patient.  Debra [name] Primary Care Provider is The [place] is a 72 y.o. female presenting for a follow up visit. She was last seen in January 2019. At that time, her lung function looked fairly stable. We added on Tussionex due to a persistent cough. We also added on Asmanex 231mcg two puffs BID in addition to her Symbicort 160/4.5 two puffs BID. She was continued on her antihistamine daily as well as nasal saline rinses. She is no longer followed by Dr. Koleen [name] in [place], but she did follow with him for her COPD and O2 requirement.   Since the last visit, she has mostly done well. Her cough finally did resolve. She does endorse some SOB intermittently. She has used her rescue inhaler yesterday but otherwise around 2-3 times per week. She is having worsening symptoms with the emergence of the tree pollen. She has remained on her Symbicort 160/4.5 two puffs BID. She has not needed steroids or ED visits since the last visit. She tells me today that she feels fairly good, but she does have audible wheezing on exam today.   Currently she is using Allegra. She was on Zyrtec prior to this, but switched off since she had been on it for some time. Spring is typically the worse time of  the year for her as well as the winter months during the cold and flu season.   She no longer sees Dr. Koleen [name] since she sees me. She did get her O2 checked last week and it was normal. She last used her oxygen at night when she was sick. She would like to come off of the oxygen since she no longer uses it anyway. 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 She is seen by a Film/video editor at [facility] in [place].   Otherwise, there have been no changes to her past  medical history, surgical history, family history, or social history. 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     Review of Systems: a 14-point review of systems is pertinent for what is mentioned in HPI.  Otherwise, all other systems were negative. Constitutional: negative other than that listed in the HPI Eyes: negative other than that listed in the HPI Ears, nose, mouth, throat, and face: negative other than that listed in the HPI Respiratory: negative other than that listed in the HPI Cardiovascular: negative other than that listed in the HPI Gastrointestinal: negative other than that listed in the HPI Genitourinary: negative other than that listed in the HPI Integument: negative other than that listed in the HPI Hematologic: negative other than that listed in the HPI Musculoskeletal: negative other than that listed in the HPI Neurological: negative other than that listed in the HPI Allergy/Immunologic: negative other than that listed in the HPI    Objective:   Blood pressure (!) 146/80, pulse 60, temperature 98.1 F (36.7 C), temperature source Oral, resp. rate 18, SpO2 94 %. There is no height or weight on file to calculate BMI.   Physical Exam:  General: Alert, interactive, in no acute distress. Pleasant female. Smiling.  Eyes: No conjunctival injection bilaterally, no discharge on the right, no discharge on the left and no Horner-Trantas dots present. PERRL bilaterally. EOMI without pain. No photophobia.  Ears: Right TM pearly gray with normal light reflex, Left TM pearly gray with normal light reflex, Right TM intact without perforation and Left TM intact without perforation.  Nose/Throat: External nose within normal limits and septum midline. Turbinates markedly edematous with clear discharge. Posterior oropharynx erythematous without cobblestoning in the posterior oropharynx. Tonsils 2+ without exudates.  Tongue without  thrush. Lungs: Mildly decreased breath sounds with expiratory wheezing bilaterally. No increased work of breathing. CV: Normal S1/S2. No murmurs. Capillary refill <2 seconds.  Skin: Warm and dry, without lesions or rashes. Neuro:   Grossly intact. No focal deficits appreciated. Responsive to questions.  Diagnostic studies:   Spirometry: results abnormal (FEV1: 0.62/40%, FVC: 0.98/45%, FEV1/FVC: 63%).    Spirometry consistent with mixed obstructive and restrictive disease. Albuterol nebulizer treatment given in clinic with significant improvement in FEV1 per ATS criteria. However, her values never normalized.   Allergy Studies: none     Salvatore Marvel, MD [facility] [place]

## 2018-04-10 ENCOUNTER — Telehealth: Payer: Self-pay | Admitting: Allergy & Immunology

## 2018-04-10 NOTE — Telephone Encounter (Signed)
We received labs from [facility].  Her complete blood count is notable for an elevated absolute eosinophil count of 230, which would qualify for either Faserna or Nucala.  I will are ask our Biologics Coordinator to send information on these medications.  I will also ask her to check on insurance coverage.  Salvatore Marvel, MD [facility] [place] [place]

## 2018-04-11 ENCOUNTER — Telehealth: Payer: Self-pay

## 2018-04-11 MED ORDER — UMECLIDINIUM BROMIDE 62.5 MCG/INH IN AEPB: 1.0000 | INHALATION_SPRAY | Freq: Every day | RESPIRATORY_TRACT | 5 refills | Status: DC

## 2018-04-11 NOTE — Telephone Encounter (Signed)
Medication change 

## 2018-04-11 NOTE — Telephone Encounter (Signed)
Let's try sending in Incruse one inhalation once daily.  Salvatore Marvel, MD [facility] [place] [place]

## 2018-04-11 NOTE — Addendum Note (Signed)
Addended by: Lucrezia Starch I on: 04/11/2018 02:27 PM   Modules accepted: Orders

## 2018-04-11 NOTE — Telephone Encounter (Signed)
Patient is calling due to her Spiriva is costing her 176.00. She is wondering is there a coupon or another medication she can have. Also she started feeling better after her visit, but she went to [place] her [place] out of [place] and the pollen has flared her back up. She is wondering can she have a prescription for: Methylprednisolone 4 mg dosage and Azithromycin 250 mg.  [facility] [address]

## 2018-04-11 NOTE — Telephone Encounter (Signed)
The patient has medicare and unfortunately the copay cards will not work for her. I also let her know that generally we do not like to send in steroids and/or antibiotics without the patient being seen. I let her know that if she feels that she needs to be seen she can go to urgent care or her PCP. I will call her with any further instructions if any.

## 2018-04-11 NOTE — Telephone Encounter (Signed)
Patient has been made aware of the medication change. She will go pick that up and began that medication.

## 2018-04-11 NOTE — Telephone Encounter (Signed)
Noted and agree with the plan.  Joel Gallagher, MD [facility]  

## 2018-04-11 NOTE — Telephone Encounter (Signed)
I will reach out to patient to discuss her options.  Since she has MCR and supplement she would be a buy and bill for [place].

## 2018-04-13 ENCOUNTER — Other Ambulatory Visit: Payer: Self-pay | Admitting: Allergy & Immunology

## 2018-04-17 DIAGNOSIS — J4550 Severe persistent asthma, uncomplicated: Secondary | ICD-10-CM | POA: Diagnosis not present

## 2018-04-18 ENCOUNTER — Ambulatory Visit (INDEPENDENT_AMBULATORY_CARE_PROVIDER_SITE_OTHER): Payer: Medicare Other

## 2018-04-18 DIAGNOSIS — J4540 Moderate persistent asthma, uncomplicated: Secondary | ICD-10-CM

## 2018-04-18 DIAGNOSIS — J4550 Severe persistent asthma, uncomplicated: Secondary | ICD-10-CM | POA: Diagnosis not present

## 2018-04-18 MED ORDER — BENRALIZUMAB 30 MG/ML ~~LOC~~ SOSY
30.0000 mg | PREFILLED_SYRINGE | SUBCUTANEOUS | Status: AC
  Administered 2018-04-18 – 2018-06-20 (×3): 30 mg via SUBCUTANEOUS

## 2018-04-18 MED ORDER — EPINEPHRINE 0.3 MG/0.3ML IJ SOAJ: 0.3000 mg | Freq: Once | INTRAMUSCULAR | 1 refills | Status: AC

## 2018-04-18 NOTE — Progress Notes (Signed)
Immunotherapy   Patient Details  Name: Debra [name] MRN: [id number] Date of Birth: [birth year]  04/18/2018  Debra [name] started injections for  Fasenra Following schedule: Every 4 weeks for 3 injections, then every 8 weeks Frequency: see above Epi-Pen:Prescription for Epi-Pen given Consent signed and patient instructions given. Patient waited 1 hour post injection. No local or systemic reactions.  Debra [name] I Debra [name] 04/18/2018, 10:09 AM

## 2018-04-19 ENCOUNTER — Telehealth: Payer: Self-pay | Admitting: Allergy & Immunology

## 2018-04-19 NOTE — Telephone Encounter (Signed)
Called patient and informed patient that she can pick up an Auvi-Q sample in [place] next Tuesday, May 7th.  Patient will be shown proper use at that time.  Patient also discussed cost of Symbicort being higher and one sample of Symbicort 160 mcg will be given to patient.  Patient voiced understanding and will come to [facility] office 04/25/18.

## 2018-04-19 NOTE — Telephone Encounter (Signed)
Dr. Gallagher? 

## 2018-04-19 NOTE — Telephone Encounter (Signed)
She needs epinephrine since she is on [place]. I think we have a sample in [place] that we can bring to [place] for her.   Salvatore Marvel, MD [facility] [place] [place]

## 2018-04-19 NOTE — Telephone Encounter (Signed)
I believe that we do.

## 2018-04-19 NOTE — Telephone Encounter (Signed)
Pt called and said that she could not afford her rx that you called in . Even the epi-pen was $600.00. Can we change her meds so she can afford them.[facility] [address] [phone].

## 2018-05-16 ENCOUNTER — Ambulatory Visit: Payer: Self-pay

## 2018-05-22 DIAGNOSIS — J4550 Severe persistent asthma, uncomplicated: Secondary | ICD-10-CM | POA: Diagnosis not present

## 2018-05-23 ENCOUNTER — Ambulatory Visit: Payer: Self-pay

## 2018-05-23 ENCOUNTER — Ambulatory Visit (INDEPENDENT_AMBULATORY_CARE_PROVIDER_SITE_OTHER): Payer: Medicare Other | Admitting: *Deleted

## 2018-05-23 DIAGNOSIS — J4540 Moderate persistent asthma, uncomplicated: Secondary | ICD-10-CM

## 2018-05-23 DIAGNOSIS — J4550 Severe persistent asthma, uncomplicated: Secondary | ICD-10-CM | POA: Diagnosis not present

## 2018-06-19 DIAGNOSIS — J4540 Moderate persistent asthma, uncomplicated: Secondary | ICD-10-CM | POA: Diagnosis not present

## 2018-06-20 ENCOUNTER — Ambulatory Visit (INDEPENDENT_AMBULATORY_CARE_PROVIDER_SITE_OTHER): Payer: Medicare Other | Admitting: *Deleted

## 2018-06-20 DIAGNOSIS — J4540 Moderate persistent asthma, uncomplicated: Secondary | ICD-10-CM | POA: Diagnosis not present

## 2018-07-11 ENCOUNTER — Encounter: Payer: Self-pay | Admitting: Allergy & Immunology

## 2018-07-11 ENCOUNTER — Ambulatory Visit (INDEPENDENT_AMBULATORY_CARE_PROVIDER_SITE_OTHER): Payer: Medicare Other | Admitting: Allergy & Immunology

## 2018-07-11 VITALS — BP 130/78 | HR 61 | Resp 17 | Ht 61.22 in | Wt 179.6 lb

## 2018-07-11 DIAGNOSIS — J3089 Other allergic rhinitis: Secondary | ICD-10-CM

## 2018-07-11 DIAGNOSIS — J449 Chronic obstructive pulmonary disease, unspecified: Secondary | ICD-10-CM

## 2018-07-11 DIAGNOSIS — R21 Rash and other nonspecific skin eruption: Secondary | ICD-10-CM | POA: Diagnosis not present

## 2018-07-11 NOTE — Progress Notes (Signed)
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 Asthma-COPD overlap syndrome - Lung function looked stable today.  - However, since the lung testing remains fairly terrible, I would like to get full pulmonary function testing, including DLCO (if only to have a baseline value). - Daily controller medication(s): Fasenra 30mg  every 8 weeks, Symbicort 160/4.34mcg two puffs twice daily with spacer and Spiriva 1.73mcg two puffs once daily - Prior to physical activity: ProAir 2 puffs 10-15 minutes before physical activity. - Rescue medications: ProAir 4 puffs every 4-6 hours as needed - Changes during respiratory infections or worsening symptoms: Add on Asmanex 28mcg to 2 puffs twice daily for ONE TO TWO WEEKS. - Asthma control goals:  * Full participation in all desired activities (may need albuterol before activity) * Albuterol use two time or less a week on average (not counting use with activity) * Cough interfering with sleep two time or less a month * Oral steroids no more than once a year * No hospitalizations  2. Perennial allergic rhinitis - Continue with Allegra one tablet 1-2 times daily as needed. - Continue with nasal saline rinses as needed.  3. Return in about 4 months (around 11/11/2018).  Subjective:   Debra [name] is a 72 y.o. female presenting today for follow up of  Chief Complaint  Patient presents with  . Asthma    Debra [name] has a history of the following: Patient Active Problem List   Diagnosis Date Noted  . Asthma-COPD overlap syndrome ([place]) 09/28/2016  . Perennial allergic rhinitis 09/28/2016    History obtained from: chart review and patient.  [facility] Primary Care Provider is [place] is a 72 y.o. female presenting for a follow up visit. She was last  seen in April 2019. AT that time, her lung function looked worse so we add on Spiriva to see if this would help. We also obtained lab work to see if an injectable asthma medication would help. We continued her on Symbicort 160/4.5 two puffs BID. We recommended continued follow up with Dr. Koleen Nimrod (Pulmonology). For her allergic rhinitis, we are continuing with Allegra one table 1-2 times daily as needed and nasal saline rinses as needed. She did start Berna Bue in the meantime, last injection in July 2019 (now on every 8 weeks).   Since the last visit, she has mostly done well. She did have a blood clot in her leg and had this drained. She presented with leg pain on June 6th. She was mowing at the time and had an enlargement on her right leg. She had previous had a leg scraping from a nail, but then above it she thinks that she developed a clot from that. She had this drained in the office and it was sewn together. She was not placed on antibiotics at all.   Asthma/Respiratory Symptom History: She reports that her breathing is stable aside from when she gets hot. She reports that she is having hot flashes. She works in a place with a Engineer, site and when it is warm in there, she will develop some shortness of breath. She started the [place], but she has not seen a whole "lot of difference". However her wheezing incidence seems to have decreased. [name]'s asthma has been well controlled. She has not required rescue medication, experienced nocturnal  awakenings due to lower respiratory symptoms, nor have activities of daily living been limited. She has required no Emergency Department or Urgent Care visits for her asthma. She has required zero courses of systemic steroids for asthma exacerbations since the last visit. ACT score today is 20, indicating excellent asthma symptom control. She has not seen Dr. Koleen Nimrod recently.   Allergic Rhinitis Symptom History: She remains on Allegra one tablet twice daily. She  has been doing well from an allergic rhinitis perspective. She is very happy with how well she is doing at this time.   Otherwise, there have been no changes to her past medical history, surgical history, family history, or social history.    Review of Systems: a 14-point review of systems is pertinent for what is mentioned in HPI.  Otherwise, all other systems were negative. Constitutional: negative other than that listed in the HPI Eyes: negative other than that listed in the HPI Ears, nose, mouth, throat, and face: negative other than that listed in the HPI Respiratory: negative other than that listed in the HPI Cardiovascular: negative other than that listed in the HPI Gastrointestinal: negative other than that listed in the HPI Genitourinary: negative other than that listed in the HPI Integument: negative other than that listed in the HPI Hematologic: negative other than that listed in the HPI Musculoskeletal: negative other than that listed in the HPI Neurological: negative other than that listed in the HPI Allergy/Immunologic: negative other than that listed in the HPI    Objective:   Blood pressure 130/78, pulse 61, resp. rate 17, height 5' 1.22" (1.555 m), weight 179 lb 9.6 oz (81.5 kg), SpO2 91 %. Body mass index is 33.69 kg/m.   Physical Exam:  General: Alert, interactive, in no acute distress. Pleasant female. Very adorable.  Eyes: No conjunctival injection bilaterally, no discharge on the right, no discharge on the left and no Horner-Trantas dots present. PERRL bilaterally. EOMI without pain. No photophobia.  Ears: Right TM pearly gray with normal light reflex, Left TM pearly gray with normal light reflex, Right TM intact without perforation and Left TM intact without perforation.  Nose/Throat: External nose within normal limits and septum midline. Turbinates edematous without discharge. Posterior oropharynx mildly erythematous without cobblestoning in the posterior  oropharynx. Tonsils 2+ without exudates.  Tongue without thrush. Lungs: Decreased breath sounds with expiratory wheezing bilaterally. No increased work of breathing. No crackles or rhonchi.  CV: Normal S1/S2. No murmurs. Capillary refill <2 seconds.  Skin: Warm and dry, without lesions or rashes. Neuro:   Grossly intact. No focal deficits appreciated. Responsive to questions.  Diagnostic studies:   Spirometry: results abnormal (FEV1: 0.72/49%, FVC: 0.98/47%, FEV1/FVC: 72%).    Spirometry consistent with possible restrictive disease. Overall they are stable compared to her last spirometry.    Allergy Studies: none      Salvatore Marvel, MD  Allergy and [facility]

## 2018-07-11 NOTE — Patient Instructions (Addendum)
1. Asthma-COPD overlap syndrome - Lung function looked stable today.  - Daily controller medication(s): Fasenra 30mg  every 8 weeks, Symbicort 160/4.33mcg two puffs twice daily with spacer and Spiriva 1.81mcg two puffs once daily - Prior to physical activity: ProAir 2 puffs 10-15 minutes before physical activity. - Rescue medications: ProAir 4 puffs every 4-6 hours as needed - Changes during respiratory infections or worsening symptoms: Add on Asmanex 244mcg to 2 puffs twice daily for ONE TO TWO WEEKS. - Asthma control goals:  * Full participation in all desired activities (may need albuterol before activity) * Albuterol use two time or less a week on average (not counting use with activity) * Cough interfering with sleep two time or less a month * Oral steroids no more than once a year * No hospitalizations  2. Perennial allergic rhinitis - Continue with Allegra one tablet 1-2 times daily as needed. - Continue with nasal saline rinses as needed.  3. Return in about 4 months (around 11/11/2018).   Please inform us of any Emergency Department visits, hospitalizations, or changes in symptoms. Call us before going to the ED for breathing or allergy symptoms since we might be able to fit you in for a sick visit. Feel free to contact us anytime with any questions, problems, or concerns.  It was a pleasure to see you again today! I am glad that you are doing very well now! Good luck with all of those grandbabies!   Websites that have reliable patient information: 1. American Academy of Asthma, Allergy, and Immunology: www.aaaai.org 2. Food Allergy Research and Education (FARE): foodallergy.org 3. Mothers of Asthmatics: http://www.asthmacommunitynetwork.org 4. American College of Allergy, Asthma, and Immunology: www.acaai.org

## 2018-07-12 ENCOUNTER — Telehealth: Payer: Self-pay

## 2018-07-12 NOTE — Telephone Encounter (Signed)
Called and scheduled patient an appt for 07/20/2018 at 9 am. Instructions are no smoking, no caffeine and no breathing medication 4 hours prior to appt. Patient should enter main entrance and at the desk ask for respiratory. Patient has been informed and she understands, patient verbalized instructions back.

## 2018-07-12 NOTE — Telephone Encounter (Signed)
-----   Message from Valentina Shaggy, MD sent at 07/11/2018  1:08 PM EDT ----- Hi there - I ordered full pulmonary function testing to take place at [facility]. Do you need to call to schedule this or put this on their radar?   Also I decided to do this after she left. Can someone call to let her know that we are ordering it to evaluate her lung function further?   Thanks much, Salvatore Marvel, MD Allergy and [facility] [place]

## 2018-07-20 ENCOUNTER — Ambulatory Visit (HOSPITAL_COMMUNITY)
Admission: RE | Admit: 2018-07-20 | Discharge: 2018-07-20 | Disposition: A | Discharge status: Home / Self Care | Payer: Medicare Other | Source: Ambulatory Visit | Attending: Allergy & Immunology | Admitting: Allergy & Immunology

## 2018-07-20 DIAGNOSIS — J449 Chronic obstructive pulmonary disease, unspecified: Secondary | ICD-10-CM | POA: Diagnosis present

## 2018-07-20 LAB — PULMONARY FUNCTION TEST
FEF 25-75 POST: 1.17 L/s
FEF 25-75 PRE: 0.65 L/s
FEF2575-%Change-Post: 79 %
FEF2575-%PRED-POST: 80 %
FEF2575-%PRED-PRE: 44 %
FEV1-%Change-Post: 9 %
FEV1-%Pred-Post: 56 %
FEV1-%Pred-Pre: 51 %
FEV1-PRE: 0.81 L
FEV1-Post: 0.88 L
FEV1FVC-%Change-Post: 7 %
FEV1FVC-%Pred-Pre: 103 %
FEV6-%CHANGE-POST: 1 %
FEV6-%PRED-POST: 53 %
FEV6-%PRED-PRE: 52 %
FEV6-POST: 1.03 L
FEV6-PRE: 1.01 L
FEV6FVC-%PRED-PRE: 104 %
FEV6FVC-%Pred-Post: 104 %
FVC-%CHANGE-POST: 1 %
FVC-%Pred-Post: 51 %
FVC-%Pred-Pre: 50 %
FVC-Post: 1.03 L
FVC-Pre: 1.01 L
POST FEV1/FVC RATIO: 85 %
Post FEV6/FVC ratio: 100 %
Pre FEV1/FVC ratio: 80 %
Pre FEV6/FVC Ratio: 100 %
RV % pred: 79 %
RV: 1.62 L
TLC % pred: 63 %
TLC: 2.9 L

## 2018-07-20 MED ORDER — ALBUTEROL SULFATE (2.5 MG/3ML) 0.083% IN NEBU
2.5000 mg | INHALATION_SOLUTION | Freq: Once | RESPIRATORY_TRACT | Status: AC
  Administered 2018-07-20: 2.5 mg via RESPIRATORY_TRACT

## 2018-08-11 ENCOUNTER — Telehealth: Payer: Self-pay | Admitting: Allergy & Immunology

## 2018-08-11 MED ORDER — ALBUTEROL SULFATE HFA 108 (90 BASE) MCG/ACT IN AERS: 2.0000 | INHALATION_SPRAY | RESPIRATORY_TRACT | 1 refills | Status: DC | PRN

## 2018-08-11 NOTE — Telephone Encounter (Signed)
Pt called and needs to have Proventil inhaler called into [facility] [address] [place].  [phone].

## 2018-08-11 NOTE — Telephone Encounter (Signed)
Prescription has been sent in as requested.  

## 2018-08-14 DIAGNOSIS — J4550 Severe persistent asthma, uncomplicated: Secondary | ICD-10-CM | POA: Diagnosis not present

## 2018-08-15 ENCOUNTER — Ambulatory Visit (INDEPENDENT_AMBULATORY_CARE_PROVIDER_SITE_OTHER): Payer: Medicare Other | Admitting: *Deleted

## 2018-08-15 DIAGNOSIS — J4550 Severe persistent asthma, uncomplicated: Secondary | ICD-10-CM | POA: Diagnosis not present

## 2018-08-15 DIAGNOSIS — J4540 Moderate persistent asthma, uncomplicated: Secondary | ICD-10-CM

## 2018-08-17 MED ORDER — BENRALIZUMAB 30 MG/ML ~~LOC~~ SOSY
30.0000 mg | PREFILLED_SYRINGE | SUBCUTANEOUS | Status: DC
  Administered 2018-08-15 – 2024-01-16 (×33): 30 mg via SUBCUTANEOUS

## 2018-10-10 ENCOUNTER — Ambulatory Visit: Payer: Medicare Other

## 2018-10-10 DIAGNOSIS — J4550 Severe persistent asthma, uncomplicated: Secondary | ICD-10-CM | POA: Diagnosis not present

## 2018-10-11 ENCOUNTER — Ambulatory Visit (INDEPENDENT_AMBULATORY_CARE_PROVIDER_SITE_OTHER): Payer: Medicare Other

## 2018-10-11 DIAGNOSIS — J4550 Severe persistent asthma, uncomplicated: Secondary | ICD-10-CM | POA: Diagnosis not present

## 2018-10-19 ENCOUNTER — Other Ambulatory Visit: Payer: Self-pay | Admitting: Allergy & Immunology

## 2018-11-07 ENCOUNTER — Ambulatory Visit: Payer: Medicare Other | Admitting: Allergy & Immunology

## 2018-11-08 ENCOUNTER — Ambulatory Visit: Payer: Medicare Other | Admitting: Allergy & Immunology

## 2018-11-10 ENCOUNTER — Encounter: Payer: Self-pay | Admitting: Allergy & Immunology

## 2018-11-10 ENCOUNTER — Ambulatory Visit (INDEPENDENT_AMBULATORY_CARE_PROVIDER_SITE_OTHER): Payer: Medicare Other | Admitting: Allergy & Immunology

## 2018-11-10 VITALS — BP 118/64 | HR 60 | Resp 18 | Ht 61.0 in | Wt 176.0 lb

## 2018-11-10 DIAGNOSIS — J449 Chronic obstructive pulmonary disease, unspecified: Secondary | ICD-10-CM | POA: Diagnosis not present

## 2018-11-10 DIAGNOSIS — J3089 Other allergic rhinitis: Secondary | ICD-10-CM

## 2018-11-10 NOTE — Patient Instructions (Addendum)
1. Asthma-COPD overlap syndrome - Lung function looked stable today.  - Since you have been so stable from a respiratory perspective, we will try to step down therapy to see how you do. - Stop the Incruse and see how you do without. - Be extra vigilant regarding your symptoms over the next few days.  - Daily controller medication(s): Fasenra 30mg  every 8 weeks and Symbicort 160/4.61mcg two puffs twice daily with spacer - Prior to physical activity: ProAir 2 puffs 10-15 minutes before physical activity. - Rescue medications: ProAir 4 puffs every 4-6 hours as needed - Changes during respiratory infections or worsening symptoms: Add on Asmanex 242mcg to 2 puffs twice daily for ONE TO TWO WEEKS. - Asthma control goals:  * Full participation in all desired activities (may need albuterol before activity) * Albuterol use two time or less a week on average (not counting use with activity) * Cough interfering with sleep two time or less a month * Oral steroids no more than once a year * No hospitalizations  2. Perennial allergic rhinitis - Continue with Allegra one tablet 1-2 times daily as needed. - Continue with nasal saline rinses as needed.  3. Return in about 4 months (around 03/11/2019).   Please inform us of any Emergency Department visits, hospitalizations, or changes in symptoms. Call us before going to the ED for breathing or allergy symptoms since we might be able to fit you in for a sick visit. Feel free to contact us anytime with any questions, problems, or concerns.  It was a pleasure to see you again today! HAPPY THANKSGIVING!   Websites that have reliable patient information: 1. American Academy of Asthma, Allergy, and Immunology: www.aaaai.org 2. Food Allergy Research and Education (FARE): foodallergy.org 3. Mothers of Asthmatics: http://www.asthmacommunitynetwork.org 4. American College of Allergy, Asthma, and Immunology: www.acaai.org

## 2018-11-10 NOTE — Progress Notes (Addendum)
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 Debra [name] presents for follow-up visit.  She is doing very well on [place].  Her spirometry remains quite awful appearing, but is stable.  She has been seen by pulmonology in the past, and her restriction is thought to be due to her history of smoking.  We are going to continue with her Symbicort as well as her Berna Bue.  She has been paying quite a bit of money for her inhalers, so we will try taking the Incruse off to see how she does with this.  This would save her around $90 per month.   Plan/Recommendations:   1. Asthma-COPD overlap syndrome - Lung function looked stable today.  - Since you have been so stable from a respiratory perspective, we will try to step down therapy to see how you do. - Stop the Incruse and see how you do without. - Be extra vigilant regarding your symptoms over the next few days.  - Consider chest CT at the next visit. - Daily controller medication(s): Fasenra 30mg  every 8 weeks and Symbicort 160/4.41mcg two puffs twice daily with spacer - Prior to physical activity: ProAir 2 puffs 10-15 minutes before physical activity. - Rescue medications: ProAir 4 puffs every 4-6 hours as needed - Changes during respiratory infections or worsening symptoms: Add on Asmanex 297mcg to 2 puffs twice daily for ONE TO TWO WEEKS. - Asthma control goals:  * Full participation in all desired activities (may need albuterol before activity) * Albuterol use two time or less a week on average (not counting use with activity) * Cough interfering with sleep two time or less a month * Oral steroids no more than once a year * No hospitalizations  2. Perennial allergic rhinitis - Continue with Allegra one tablet 1-2 times daily as needed. - Continue with nasal saline  rinses as needed.  3. Return in about 4 months (around 03/11/2019).   Subjective:   Debra [name] is a 72 y.o. female presenting today for follow up of  Chief Complaint  Patient presents with  . Asthma    doing pretty well. little to no issues.     Debra [name] has a history of the following: Patient Active Problem List   Diagnosis Date Noted  . Asthma-COPD overlap syndrome ([place]) 09/28/2016  . Perennial allergic rhinitis 09/28/2016    History obtained from: chart review and patient.  [facility] Primary Care Provider is Zhou-Talbert, Elwyn Lade, MD.     Debra [name] is a 72 y.o. female presenting for a follow up visit.  She was last seen in July 2019.  At that time, her lung function was stable.  She has severe restriction.  We did order full pulmonary function testing that showed obstruction.  Lung capacity was moderately reduced.  Unfortunately she cannot perform the DLCO portion of the exam.  For her rhinitis, we continued Allegra 1 tablet 1-2 times daily as well as nasal saline rinses.  Since the last visit, she has done very well.  Asthma/Respiratory Symptom History: She reports that the Berna Bue is working well. She tells me that the last time that she got prednisone back in September 2019, but that was for arthritis. However it has been much longer since she got it for breathing purposes. She is using her rescue inhaler around one time per  week. She does report feeling sluggish especially when it is hot outside.  ACT score today is 19, indicating excellent asthma control.  Allergic Rhinitis Symptom History: Nasal symptoms are "pretty good". At times, she does report that she wakes up a headaches in the morning. This occurs nearly every day. We did order an environmental allergy panel in the spring but this was not collected. She does use nasal spray.   Otherwise, there have been no changes to her past medical history, surgical history, family history, or social history.    Review of  Systems: a 14-point review of systems is pertinent for what is mentioned in HPI.  Otherwise, all other systems were negative.  Constitutional: negative other than that listed in the HPI Eyes: negative other than that listed in the HPI Ears, nose, mouth, throat, and face: negative other than that listed in the HPI Respiratory: negative other than that listed in the HPI Cardiovascular: negative other than that listed in the HPI Gastrointestinal: negative other than that listed in the HPI Genitourinary: negative other than that listed in the HPI Integument: negative other than that listed in the HPI Hematologic: negative other than that listed in the HPI Musculoskeletal: negative other than that listed in the HPI Neurological: negative other than that listed in the HPI Allergy/Immunologic: negative other than that listed in the HPI    Objective:   Blood pressure 118/64, pulse 60, resp. rate 18, height 5\' 1"  (1.549 m), weight 176 lb (79.8 kg), SpO2 98 %. Body mass index is 33.25 kg/m.   Physical Exam:  General: Alert, interactive, in no acute distress.  Pleasant female. Eyes: No conjunctival injection bilaterally, no discharge on the right, no discharge on the left and no Horner-Trantas dots present. PERRL bilaterally. EOMI without pain. No photophobia.  Ears: Right TM pearly gray with normal light reflex, Left TM pearly gray with normal light reflex, Right TM intact without perforation and Left TM intact without perforation.  Nose/Throat: External nose within normal limits and septum midline. Turbinates edematous and pale with clear discharge. Posterior oropharynx erythematous without cobblestoning in the posterior oropharynx. Tonsils 2+ without exudates.  Tongue without thrush. Lungs: Decreased breath sounds with expiratory wheezing bilaterally (baseline for the patient). No increased work of breathing. CV: Normal S1/S2. No murmurs. Capillary refill <2 seconds.  Skin: Warm and dry,  without lesions or rashes. Neuro:   Grossly intact. No focal deficits appreciated. Responsive to questions.  Diagnostic studies:   Spirometry: results abnormal (FEV1: 0.74/50%, FVC: 0.92/44%, FEV1/FVC: 80%).    Spirometry consistent with terrible restriction (but stable). .  Allergy Studies: none      Salvatore Marvel, MD  Allergy and [facility] [place]

## 2018-11-13 NOTE — Addendum Note (Signed)
Addended by: Lytle Michaels A on: 11/13/2018 04:13 PM   Modules accepted: Orders

## 2018-11-20 ENCOUNTER — Telehealth: Payer: Self-pay | Admitting: *Deleted

## 2018-11-20 ENCOUNTER — Other Ambulatory Visit: Payer: Self-pay | Admitting: *Deleted

## 2018-11-20 MED ORDER — MOMETASONE FUROATE 200 MCG/ACT IN AERO: 2.0000 | INHALATION_SPRAY | Freq: Two times a day (BID) | RESPIRATORY_TRACT | 5 refills | Status: DC

## 2018-11-20 NOTE — Telephone Encounter (Signed)
Patient called stating she was doing okay without the medication Dr Ernst Bowler stopped (encruise pt stated), but patient said she don't think it was the medication, patient states she is hoarse, having asthma, copd and the allergies. Please advise

## 2018-11-20 NOTE — Telephone Encounter (Signed)
Called and spoke with the patient and informed of the plan. Patient confirmed that that she is taking all medications except for Asmanex which was never sent in to the patient's pharmacy. Prescription has been sent in to the requested pharmacy. Patient will be picking up the medication and will be seeing her PCP in the morning. Advised that if the patient needs anything or has any questions to please call the office. Patient verbalized understanding.

## 2018-11-20 NOTE — Telephone Encounter (Signed)
Called and spoke with the patient. The patient was experiencing SOB and difficulty breathing that did not improve with nebulizer or emergency inhaler treatments. Patient went to the hospital Saturday, an EKG and Chest x-ray was performed. Patient was given a prednisone taper and sent home. EKG and Chest x-ray were normal and there was no indication of pneumonia. Patient is still currently using her regular maintenance inhaler and nebulizer and other medications prescribed. Patient feels like with the rain and being outside she may be catching a cold. Patient does have an appointment with her PCP tomorrow morning for a follow up and will talk with her PCP about receiving any further treatment for her symptoms. Until then the patient was wondering if there is anything else that can be done to help with her symptoms. Please advise since Dr. Ernst Bowler is out of the office.

## 2018-11-20 NOTE — Telephone Encounter (Signed)
Please call patient and make sure she is taking these medications:  - Daily controller medication(s): Fasenra 30mg  every 8 weeks and Symbicort 160/4.70mcg two puffs twice daily with spacer - Rescue medications: ProAir 4 puffs every 4-6 hours as needed - Changes during respiratory infections or worsening symptoms: Add on Asmanex 275mcg to 2 puffs twice daily for ONE TO TWO WEEKS. - she needs to add this now. - Continue with Allegra one tablet 1-2 times daily as needed. - Continue with nasal saline rinses as needed.  Thank you.

## 2018-12-05 DIAGNOSIS — J4550 Severe persistent asthma, uncomplicated: Secondary | ICD-10-CM | POA: Diagnosis not present

## 2018-12-06 ENCOUNTER — Ambulatory Visit (INDEPENDENT_AMBULATORY_CARE_PROVIDER_SITE_OTHER): Payer: Medicare Other | Admitting: *Deleted

## 2018-12-06 DIAGNOSIS — J4550 Severe persistent asthma, uncomplicated: Secondary | ICD-10-CM | POA: Diagnosis not present

## 2019-01-30 DIAGNOSIS — J4550 Severe persistent asthma, uncomplicated: Secondary | ICD-10-CM | POA: Diagnosis not present

## 2019-01-31 ENCOUNTER — Ambulatory Visit (INDEPENDENT_AMBULATORY_CARE_PROVIDER_SITE_OTHER): Payer: Medicare Other

## 2019-01-31 DIAGNOSIS — J4550 Severe persistent asthma, uncomplicated: Secondary | ICD-10-CM

## 2019-03-16 ENCOUNTER — Ambulatory Visit: Payer: Medicare Other | Admitting: Allergy & Immunology

## 2019-03-28 ENCOUNTER — Ambulatory Visit: Payer: Self-pay

## 2019-04-05 DIAGNOSIS — J4550 Severe persistent asthma, uncomplicated: Secondary | ICD-10-CM | POA: Diagnosis not present

## 2019-04-06 ENCOUNTER — Ambulatory Visit: Payer: Medicare Other

## 2019-04-06 ENCOUNTER — Encounter: Payer: Self-pay | Admitting: Allergy & Immunology

## 2019-04-06 ENCOUNTER — Other Ambulatory Visit: Payer: Self-pay

## 2019-04-06 ENCOUNTER — Ambulatory Visit (INDEPENDENT_AMBULATORY_CARE_PROVIDER_SITE_OTHER): Payer: Medicare Other | Admitting: Allergy & Immunology

## 2019-04-06 VITALS — BP 132/80 | HR 62 | Temp 98.1°F | Resp 16 | Ht 61.0 in | Wt 181.0 lb

## 2019-04-06 DIAGNOSIS — J4550 Severe persistent asthma, uncomplicated: Secondary | ICD-10-CM

## 2019-04-06 DIAGNOSIS — J3089 Other allergic rhinitis: Secondary | ICD-10-CM

## 2019-04-06 NOTE — Progress Notes (Signed)
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  She is doing remarkably well on her Berna Bue.  She has a history of a restrictive pattern on her spirometry, but we did not perform spirometry due to its propensity for spreading the coronavirus.  However, symptomatically she is doing fantastic.  Affording her medications continues to be an issue.  For instance, she did pay over $400 for 1 month of her Symbicort.  Because of this, we are going to try to decrease her Symbicort to 1 puff twice daily.  This would allow her inhaler to last for 2 months.  I did give her strict indications for increasing this back to 2 puffs twice daily, including increased coughing, increased use of her rescue inhaler, or other concerns. She will contact us if she is having problems.   Plan/Recommendations:   1. Asthma-COPD overlap syndrome - Lung function not performed today since it can spread the coronavirus.  - Since you have been so stable from a respiratory perspective, we will decrease your Symbicort dosing to one puff twice daily. - If you are using your albuterol more frequently with this decreased dose, go back to to two puffs twice daily to see how you do.  - Daily controller medication(s): Fasenra 30mg  every 8 weeks and Symbicort 160/4.29mcg one puff twice daily with spacer - Prior to physical activity: ProAir 2 puffs 10-15 minutes before physical activity. - Rescue medications: ProAir 4 puffs every 4-6 hours as needed - Changes during respiratory infections or worsening symptoms: Increase Symbicort 160/4.65mcg to 2 puffs twice daily for ONE TO TWO WEEKS. - Asthma control goals:  * Full participation in all desired activities (may need albuterol before activity) * Albuterol use two time or less a  week on average (not counting use with activity) * Cough interfering with sleep two time or less a month * Oral steroids no more than once a year * No hospitalizations  2. Perennial allergic rhinitis - Continue with Allegra one tablet 1-2 times daily as needed. - Continue with nasal saline rinses as needed.  3. Return in about 4 months (around 08/06/2019). This can be an in-person, a virtual Webex or a telephone follow up visit.   Subjective:   Debra [name] is a 73 y.o. female presenting today for follow up of  Chief Complaint  Patient presents with  . Asthma    Debra [name] has a history of the following: Patient Active Problem List   Diagnosis Date Noted  . Asthma-COPD overlap syndrome ([place]) 09/28/2016  . Perennial allergic rhinitis 09/28/2016    History obtained from: chart review and patient.  Debra [name] is a 73 y.o. female presenting for a follow up visit.  She was last seen in November 2019.  At that time, her lung function looks stable.  Since she was doing so well from a respiratory perspective symptomatically, we stopped her Incruse to see how she would do.  This would also save her $90 per month.  We continued her on Fasenra 30 mg every 8 weeks and Symbicort 160/4.5 mcg 2 puffs twice daily.  Her rhinitis was controlled with Allegra 1 tablet up to twice daily as needed and nasal saline rinses.  Since the last visit, she has done well. She is really excited about planting her garden which is  much smaller than it has been in the past.   Asthma/Respiratory Symptom History: She remains on the for center every 8 weeks.  This seems to be controlling her symptoms well.  She is also on the Symbicort 2 puffs in the morning and 2 puffs at night.  At her worst, she is paying around $450 a month for the Symbicort.  However, this is only until she meets her deductible, at which time the price drops to around $40 per month.  She has been able to afford this because we stopped the Incruse at the  last visit which was $90 per month  She did need a steroid injection within the last month, which she received at her primary care doctor in [place].  She estimates that she needs systemic steroids around once per year.  This is markedly better than it was before she started the [place].  She has not been to the emergency room for her symptoms.  She feels that the biggest triggers to her symptoms include physical activity, notably walking upstairs.  Allergic Rhinitis Symptom History: She does have Allegra on hand to use as needed.  She has not needed that on a regular basis, although she did when she was outside working in her garden earlier this week.  She does use nasal saline rinses as needed.  She has not needed antibiotics since last visit.  Otherwise, there have been no changes to her past medical history, surgical history, family history, or social history.  She has been trying to isolate herself during the coronavirus pandemic, but she does try to at least drive around just to get out of her house.    Review of Systems  Constitutional: Negative.  Negative for fever, malaise/fatigue and weight loss.  HENT: Negative.  Negative for congestion, ear discharge and ear pain.   Eyes: Negative for pain, discharge and redness.  Respiratory: Positive for cough and shortness of breath. Negative for sputum production and wheezing.   Cardiovascular: Negative.  Negative for chest pain and palpitations.  Gastrointestinal: Negative for abdominal pain, heartburn, nausea and vomiting.  Skin: Negative.  Negative for itching and rash.  Neurological: Negative for dizziness and headaches.  Endo/Heme/Allergies: Positive for environmental allergies. Does not bruise/bleed easily.       Objective:   Blood pressure 132/80, pulse 62, temperature 98.1 F (36.7 C), temperature source Oral, resp. rate 16, height 5\' 1"  (1.549 m), weight 181 lb (82.1 kg), SpO2 98 %. Body mass index is 34.2 kg/m.   Physical  Exam:  Physical Exam  Constitutional: She appears well-developed.  Pleasant female.  Obese.  HENT:  Head: Normocephalic and atraumatic.  Right Ear: Tympanic membrane, external ear and ear canal normal. No drainage, swelling or tenderness. Tympanic membrane is not injected, not scarred, not erythematous, not retracted and not bulging.  Left Ear: Tympanic membrane, external ear and ear canal normal. No drainage, swelling or tenderness. Tympanic membrane is not injected, not scarred, not erythematous, not retracted and not bulging.  Nose: Mucosal edema present. No rhinorrhea, nasal deformity or septal deviation. No epistaxis. Right sinus exhibits no maxillary sinus tenderness and no frontal sinus tenderness. Left sinus exhibits no maxillary sinus tenderness and no frontal sinus tenderness.  Mouth/Throat: Uvula is midline and oropharynx is clear and moist. Mucous membranes are not pale and not dry.  Eyes: Pupils are equal, round, and reactive to light. Conjunctivae and EOM are normal. Right eye exhibits no chemosis and no discharge. Left eye exhibits no chemosis and  no discharge. Right conjunctiva is not injected. Left conjunctiva is not injected.  Cardiovascular: Normal rate, regular rhythm and normal heart sounds.  Respiratory: Effort normal and breath sounds normal. No accessory muscle usage. No tachypnea. No respiratory distress. She has no wheezes. She has no rhonchi. She has no rales. She exhibits no tenderness.  Moving air well in all lung fields, but there are some isolated wheezes present.  This is essentially her baseline.  GI: There is no abdominal tenderness. There is no rebound and no guarding.  Lymphadenopathy:       Head (right side): No submandibular, no tonsillar and no occipital adenopathy present.       Head (left side): No submandibular, no tonsillar and no occipital adenopathy present.    She has no cervical adenopathy.  Neurological: She is alert.  Skin: No abrasion, no  petechiae and no rash noted. Rash is not papular, not vesicular and not urticarial. No erythema. No pallor.  No urticaria present.  Psychiatric: She has a normal mood and affect.     Diagnostic studies: none    Salvatore Marvel, MD  [facility]

## 2019-04-06 NOTE — Patient Instructions (Addendum)
1. Asthma-COPD overlap syndrome - Lung function not performed today since it can spread the coronavirus.  - Since you have been so stable from a respiratory perspective, we will decrease your Symbicort dosing to one puff twice daily. - If you are using your albuterol more frequently with this decreased dose, go back to to two puffs twice daily to see how you do.  - Daily controller medication(s): Fasenra 30mg  every 8 weeks and Symbicort 160/4.62mcg one puff twice daily with spacer - Prior to physical activity: ProAir 2 puffs 10-15 minutes before physical activity. - Rescue medications: ProAir 4 puffs every 4-6 hours as needed - Changes during respiratory infections or worsening symptoms: Increase Symbicort 160/4.59mcg to 2 puffs twice daily for ONE TO TWO WEEKS. - Asthma control goals:  * Full participation in all desired activities (may need albuterol before activity) * Albuterol use two time or less a week on average (not counting use with activity) * Cough interfering with sleep two time or less a month * Oral steroids no more than once a year * No hospitalizations  2. Perennial allergic rhinitis - Continue with Allegra one tablet 1-2 times daily as needed. - Continue with nasal saline rinses as needed.  3. Return in about 4 months (around 08/06/2019). This can be an in-person, a virtual Webex or a telephone follow up visit.   Please inform us of any Emergency Department visits, hospitalizations, or changes in symptoms. Call us before going to the ED for breathing or allergy symptoms since we might be able to fit you in for a sick visit. Feel free to contact us anytime with any questions, problems, or concerns.  It was a pleasure to see you again today! Good luck with that garden! Stay safe!   Websites that have reliable patient information: 1. American Academy of Asthma, Allergy, and Immunology: www.aaaai.org 2. Food Allergy Research and Education (FARE): foodallergy.org 3. Mothers of  Asthmatics: http://www.asthmacommunitynetwork.org 4. American College of Allergy, Asthma, and Immunology: www.acaai.org  "Like" [place] on Facebook and Instagram for our latest updates!      Make sure you are registered to vote! If you have moved or changed any of your contact information, you will need to get this updated before voting!    Voter ID laws are NOT going into effect for the General Election in November 2020! DO NOT let this stop you from exercising your right to vote!

## 2019-04-17 ENCOUNTER — Telehealth: Payer: Self-pay

## 2019-04-17 NOTE — Telephone Encounter (Signed)
Agreed - I would prefer that she had a pulmonologist on board to prescribe the O2.   Salvatore Marvel, MD [facility]

## 2019-04-17 NOTE — Telephone Encounter (Signed)
Patient called to ask if we would be able to take over her oxygen therapy from the pulmonologist that she is currently followed by. I explained in detail the differences between our practices and what is generally handled by both. She did acknowledge understanding of this information and will call her pulmonologist for an updated oxygen statement. I did let her know that I would speak with Dr. Ernst Bowler, but to keep in mind that generally her pulmonologist would be the one that would handle her oxygen therapy. She did voice understanding of this once again.

## 2019-04-17 NOTE — Telephone Encounter (Signed)
Patient called back and has been informed that Dr. Ernst Bowler is in agreement with the pulmonologist following O2 therapy.

## 2019-04-17 NOTE — Telephone Encounter (Signed)
I called Debra [name] but was unable to reach her. I was not able to leave a message either.

## 2019-05-29 DIAGNOSIS — J4550 Severe persistent asthma, uncomplicated: Secondary | ICD-10-CM

## 2019-05-30 ENCOUNTER — Ambulatory Visit (INDEPENDENT_AMBULATORY_CARE_PROVIDER_SITE_OTHER): Payer: Medicare Other

## 2019-05-30 DIAGNOSIS — J4550 Severe persistent asthma, uncomplicated: Secondary | ICD-10-CM | POA: Diagnosis not present

## 2019-07-25 ENCOUNTER — Ambulatory Visit: Payer: Self-pay

## 2019-07-25 ENCOUNTER — Ambulatory Visit: Payer: Medicare Other | Admitting: Allergy & Immunology

## 2019-07-30 ENCOUNTER — Telehealth: Payer: Self-pay

## 2019-07-30 NOTE — Telephone Encounter (Signed)
This sounds like something that will require a virtual visit or in person visit with Dr. Ernst Bowler.  He is in the office tomorrow.

## 2019-07-30 NOTE — Telephone Encounter (Signed)
Patient called and has been having a Runny nose, Cough & Congestion.   Patient is doing Breathing Treatments, Alka-Seltzer Plus Cold Tablets, Robitussin &  Mucinex   Patient states Teslone Pearls do not work for her.    Patient is requesting: Promethazine DM Syrup  Amoxicillin  [facility]

## 2019-07-30 NOTE — Telephone Encounter (Signed)
Dr Verlin Fester can you please advise this is a Dr Ernst Bowler patient

## 2019-07-30 NOTE — Telephone Encounter (Signed)
Spoke with patient and she stated her cough is getting worse and states her mucus is thickening. Patient states she has been doing her breathing tx more often and non of the tessalon pearls help. Patient will be place onto Dr. Ernst Bowler appointment tomorrow for a virtual visit. Patient had no complaints of fever, chills or any flu like sx and or Covid-19 sx. Patient will have her grandchild accompany her.

## 2019-07-31 ENCOUNTER — Telehealth (INDEPENDENT_AMBULATORY_CARE_PROVIDER_SITE_OTHER): Payer: Medicare Other | Admitting: Allergy & Immunology

## 2019-07-31 ENCOUNTER — Encounter: Payer: Self-pay | Admitting: Allergy & Immunology

## 2019-07-31 ENCOUNTER — Other Ambulatory Visit: Payer: Self-pay

## 2019-07-31 DIAGNOSIS — J0100 Acute maxillary sinusitis, unspecified: Secondary | ICD-10-CM

## 2019-07-31 DIAGNOSIS — J4550 Severe persistent asthma, uncomplicated: Secondary | ICD-10-CM | POA: Diagnosis not present

## 2019-07-31 DIAGNOSIS — J3089 Other allergic rhinitis: Secondary | ICD-10-CM

## 2019-07-31 MED ORDER — DOXYCYCLINE HYCLATE 100 MG PO CAPS: 100.0000 mg | ORAL_CAPSULE | Freq: Two times a day (BID) | ORAL | 0 refills | Status: AC

## 2019-07-31 MED ORDER — PREDNISONE 10 MG PO TABS: ORAL_TABLET | ORAL | 0 refills | Status: DC

## 2019-07-31 NOTE — Progress Notes (Signed)
RE: Debra [name] MRN: [id number] DOB: Dec 11, 1946 Date of Telemedicine Visit: 07/31/2019  Referring provider: Alfonse [name] Primary care provider: Alfonse Flavors, MD  Chief Complaint: Allergic Rhinitis , Asthma, and COPD   Telemedicine Follow Up Visit via WebEx: I connected with Debra [name] for a follow up on 07/31/19 by [place] and verified that I am speaking with the correct person using two identifiers.   I discussed the limitations, risks, security and privacy concerns of performing an evaluation and management service by telemedicine and the availability of in person appointments. I also discussed with the patient that there may be a patient responsible charge related to this service. The patient expressed understanding and agreed to proceed.  Patient is at home accompanied by her grandchildren who provided/contributed to the history.  Provider is at the office.  Visit start time: 9:08 AM Visit end time: 9:27 AM Insurance consent/check in by: [facility] consent and medical assistant/nurse: Trina  History of Present Illness:  She is a 73 y.o. female, who is being followed for asthma-COPD and chronic rhinitis. Her previous allergy office visit was in April 2020 with myself.  At that time, she was doing very well on Fasenra every 8 weeks.  Because of this, we decreased her Symbicort to 1 puff twice daily.  We continued with albuterol every 4-6 hours as needed.  For her rhinitis, we continue with Allegra as well as nasal saline rinses.  Since the last visit, she has mostly done well. Her grandson did have a runny nose and this was allergies. This was on Friday. Then over the weekend she started having a sore throat. Now she is congested and is coughing for several nights in a row. She remains sick and is wheezing. She tells me that she is not wheezing a lot, but now she is coughing up some mucous.  She denies any fever.  She does check her temperature before  she goes to work every day.  She has been around no known COVID patients.  She is scheduled to see me on August 26th, at which time she will get her for center injection as well.  Asthma/Respiratory Symptom History: She remains on her Symbicort two puffs twice daily. She does feel that the Berna Bue is doing well with controlling her symptoms.  Allure's asthma has been well controlled. She has not required rescue medication, experienced nocturnal awakenings due to lower respiratory symptoms, nor have activities of daily living been limited. She has required no Emergency Department or Urgent Care visits for her asthma. She has required zero courses of systemic steroids for asthma exacerbations since the last visit. ACT score today is 16, indicating subpar asthma symptom control.  She cannot remember the last time that she needed prednisone since she has been on the [place].  Allergic Rhinitis Symptom History: She remains on the Allegra once daily.  She is also using the nasal saline rinses.  She has not needed antibiotics in quite some time.   Otherwise, there have been no changes to her past medical history, surgical history, family history, or social history.  Assessment and Plan:  [name] is a 73 y.o. female with:  Asthma-COPD overlap syndrome- with severe stable restrictionimproved on Fasenra  Perennial allergic rhinitis  Previous smoker (30+ years)   1. Asthma-COPD overlap syndrome - We are starting a prednisone burst today as well as doxycycline to control your current flare. - We are not going to make any medication changes today.  -  Daily controller medication(s): Fasenra 30mg  every 8 weeks and Symbicort 160/4.39mcg one puff twice daily with spacer - Prior to physical activity: ProAir 2 puffs 10-15 minutes before physical activity. - Rescue medications: ProAir 4 puffs every 4-6 hours as needed - Changes during respiratory infections or worsening symptoms: Increase Symbicort 160/4.44mcg to  2 puffs twice daily for ONE TO TWO WEEKS. - Asthma control goals:  * Full participation in all desired activities (may need albuterol before activity) * Albuterol use two time or less a week on average (not counting use with activity) * Cough interfering with sleep two time or less a month * Oral steroids no more than once a year * No hospitalizations  2. Perennial allergic rhinitis - Continue with Allegra one tablet 1-2 times daily as needed. - Continue with nasal saline rinses as needed. - Call us on Thursday if there is no improvement at all.   3. Return in about 2 weeks (around 08/14/2019) as scheduled.    Diagnostics: None.  Medication List:  Current Outpatient Medications  Medication Sig Dispense Refill  . albuterol (PROVENTIL HFA;VENTOLIN HFA) 108 (90 Base) MCG/ACT inhaler Inhale 2 puffs into the lungs every 4 (four) hours as needed for wheezing or shortness of breath (cough). 18 g 1  . amLODipine (NORVASC) 5 MG tablet Take 5 mg by mouth daily.    Marland Kitchen aspirin 81 MG tablet Take 81 mg by mouth daily.    Marland Kitchen atorvastatin (LIPITOR) 80 MG tablet Take 80 mg by mouth daily.    . Azelastine HCl 0.15 % SOLN Place 2 sprays into both nostrils 2 (two) times daily. 30 mL 5  . citalopram (CELEXA) 10 MG tablet Take 10 mg by mouth daily.    Marland Kitchen gabapentin (NEURONTIN) 300 MG capsule 300 mg 3 (three) times daily.     Marland Kitchen ipratropium-albuterol (DUONEB) 0.5-2.5 (3) MG/3ML SOLN USE 3ML IN NEB EVERY 4 HOURS AS NEEDED  2  . lisinopril (PRINIVIL,ZESTRIL) 10 MG tablet Take 10 mg by mouth daily.    . metoprolol (LOPRESSOR) 100 MG tablet     . Mometasone Furoate (ASMANEX HFA) 200 MCG/ACT AERO Inhale 2 puffs into the lungs 2 (two) times daily. 1 Inhaler 5  . montelukast (SINGULAIR) 10 MG tablet TAKE 1 TABLET BY MOUTH EVERY DAY 30 tablet 3  . SYMBICORT 160-4.5 MCG/ACT inhaler     . doxycycline (VIBRAMYCIN) 100 MG capsule Take 1 capsule (100 mg total) by mouth 2 (two) times daily for 10 days. 20 capsule 0  .  hydrochlorothiazide (HYDRODIURIL) 12.5 MG tablet Take 12.5 mg by mouth daily.    . INCRUSE ELLIPTA 62.5 MCG/INH AEPB TAKE 1 PUFF BY MOUTH EVERY DAY (Patient not taking: Reported on 07/31/2019) 30 each 2  . predniSONE (DELTASONE) 10 MG tablet Take 3 tabs (30mg ) twice daily for 3 days, then 2 tabs (20mg ) twice daily for 3 days, then 1 tab (10mg ) twice daily for 3 days, then STOP. 36 tablet 0  . Vitamin D, Ergocalciferol, (DRISDOL) 1.25 MG (50000 UT) CAPS capsule TAKE 1 CAPSULE BY MOUTH MONTHLY     Current Facility-Administered Medications  Medication Dose Route Frequency Provider Last Rate Last Dose  . Benralizumab SOSY 30 mg  30 mg Subcutaneous Q8 Toney Reil, MD   30 mg at 05/30/19 0960   Allergies: Allergies  Allergen Reactions  . Aspirin     GI upset   I reviewed her past medical history, social history, family history, and environmental history and no significant changes  have been reported from previous visits.  Review of Systems  Constitutional: Negative for activity change and appetite change.  HENT: Positive for congestion and sneezing. Negative for nosebleeds, postnasal drip, rhinorrhea, sinus pressure, sinus pain and sore throat.   Eyes: Negative for pain, discharge, redness and itching.  Respiratory: Positive for cough. Negative for shortness of breath, wheezing and stridor.   Gastrointestinal: Negative for diarrhea, nausea and vomiting.  Musculoskeletal: Negative for arthralgias, joint swelling and myalgias.  Skin: Negative for rash.  Allergic/Immunologic: Negative for environmental allergies and food allergies.    Objective:  Physical exam not obtained as encounter was done via telephone.   Previous notes and tests were reviewed.  I discussed the assessment and treatment plan with the patient. The patient was provided an opportunity to ask questions and all were answered. The patient agreed with the plan and demonstrated an understanding of the  instructions.   The patient was advised to call back or seek an in-person evaluation if the symptoms worsen or if the condition fails to improve as anticipated.  I provided 19 minutes of non-face-to-face time during this encounter.  It was my pleasure to participate in [facility] care today. Please feel free to contact me with any questions or concerns.   Sincerely,  Valentina Shaggy, MD

## 2019-07-31 NOTE — Patient Instructions (Addendum)
1. Asthma-COPD overlap syndrome - We are starting a prednisone burst today as well as doxycycline to control your current flare. - We are not going to make any medication changes today.  - Daily controller medication(s): Fasenra 30mg  every 8 weeks and Symbicort 160/4.39mcg one puff twice daily with spacer - Prior to physical activity: ProAir 2 puffs 10-15 minutes before physical activity. - Rescue medications: ProAir 4 puffs every 4-6 hours as needed - Changes during respiratory infections or worsening symptoms: Increase Symbicort 160/4.60mcg to 2 puffs twice daily for ONE TO TWO WEEKS. - Asthma control goals:  * Full participation in all desired activities (may need albuterol before activity) * Albuterol use two time or less a week on average (not counting use with activity) * Cough interfering with sleep two time or less a month * Oral steroids no more than once a year * No hospitalizations  2. Perennial allergic rhinitis - Continue with Allegra one tablet 1-2 times daily as needed. - Continue with nasal saline rinses as needed. - Call us on Thursday if there is no improvement at all.   3. Return in about 2 weeks (around 08/14/2019) as scheduled.

## 2019-07-31 NOTE — Addendum Note (Signed)
Addended by: Neomia Dear on: 07/31/2019 10:05 AM   Modules accepted: Orders

## 2019-08-14 DIAGNOSIS — J4550 Severe persistent asthma, uncomplicated: Secondary | ICD-10-CM | POA: Diagnosis not present

## 2019-08-15 ENCOUNTER — Encounter: Payer: Self-pay | Admitting: Allergy & Immunology

## 2019-08-15 ENCOUNTER — Ambulatory Visit: Payer: Self-pay

## 2019-08-15 ENCOUNTER — Ambulatory Visit: Payer: Medicare Other | Admitting: Allergy & Immunology

## 2019-08-15 ENCOUNTER — Ambulatory Visit (INDEPENDENT_AMBULATORY_CARE_PROVIDER_SITE_OTHER): Payer: Medicare Other | Admitting: Allergy & Immunology

## 2019-08-15 ENCOUNTER — Other Ambulatory Visit: Payer: Self-pay

## 2019-08-15 ENCOUNTER — Ambulatory Visit: Payer: Medicare Other

## 2019-08-15 VITALS — BP 158/60 | HR 66 | Temp 97.9°F | Resp 16 | Ht 61.0 in

## 2019-08-15 DIAGNOSIS — J449 Chronic obstructive pulmonary disease, unspecified: Secondary | ICD-10-CM | POA: Diagnosis not present

## 2019-08-15 DIAGNOSIS — B999 Unspecified infectious disease: Secondary | ICD-10-CM

## 2019-08-15 DIAGNOSIS — J3089 Other allergic rhinitis: Secondary | ICD-10-CM | POA: Diagnosis not present

## 2019-08-15 DIAGNOSIS — J4550 Severe persistent asthma, uncomplicated: Secondary | ICD-10-CM | POA: Diagnosis not present

## 2019-08-15 MED ORDER — CEFDINIR 300 MG PO CAPS: 300.0000 mg | ORAL_CAPSULE | Freq: Two times a day (BID) | ORAL | 0 refills | Status: AC

## 2019-08-15 NOTE — Progress Notes (Signed)
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 Debra [name] presents for a sick visit.  We did give her doxycycline and prednisone 2 to 3 weeks ago, which did not provide much relief.  She then went to urgent care earlier this week and was given a steroid injection and another 5 days of prednisone.  She is feeling better from sinus perspective, but she continues to report chest congestion and sputum production.  We are going to add on cefdinir twice daily for 1 week.  I do not think we need to do anything with her steroids at this point since she is still on them.  I am going to get a work-up for an immune deficiency.  I doubt this is the case, but I want to rule this out anyway. This is likely just her baseline asthma-COPD. She does continue to follow with Dr. Koleen Nimrod in [place], who prescribes her oxygen.   Plan/Recommendations:   1. Asthma-COPD overlap syndrome - Lung testing looked slightly lower today. - We are not going to make any medication today, but we may consider adding another medication in the future.  - Daily controller medication(s): Fasenra 30mg  every 8 weeks and Symbicort 160/4.46mcg one puff twice daily with spacer - Prior to physical activity: ProAir 2 puffs 10-15 minutes before physical activity. - Rescue medications: ProAir 4 puffs every 4-6 hours as needed - Changes during respiratory infections or worsening symptoms: Increase Symbicort 160/4.58mcg to 2 puffs twice daily for ONE TO TWO WEEKS. - Asthma control goals:  * Full participation in all desired activities (may need albuterol before activity) * Albuterol use two time or less a week on average (not counting use with activity) * Cough interfering with sleep two time or less  a month * Oral steroids no more than once a year * No hospitalizations  2. Perennial allergic rhinitis - Continue with Allegra one tablet 1-2 times daily as needed. - Continue with nasal saline rinses as needed.   3. Chronic sinusitis - We are going to add on cefdinir 300mg  twice daily for one week to hopefully wipe this out.  - We may get some screening labs to look for an immunodeficiency.  4. Return in about 3 months (around 11/15/2019). This can be an in-person, a virtual Webex or a telephone follow up visit.  Subjective:   Debra [name] is a 73 y.o. female presenting today for follow up of  Chief Complaint  Patient presents with   Asthma   Cough    [name] has a history of the following: Patient Active Problem List   Diagnosis Date Noted   Asthma-COPD overlap syndrome ([place]) 09/28/2016   Perennial allergic rhinitis 09/28/2016    History obtained from: chart review and patient.  Debra [name] is a 73 y.o. female presenting for a follow up visit.  She was last seen as a video visit in August 2020.  At that time, we gave her a prednisone burst and doxycycline due to a flare.  We continued Fasenra every 8 weeks and Symbicort 160/4.5 mcg 1 puff twice daily.  For her rhinitis, we continued with Allegra as well as nasal saline rinses.  Since last visit, she has not done well. She completed the doxycycline and continued to feel bad. She then went to  Urgent Care and was given another course of prednisone. She was given cough medicine which helped. Her head is no longer exploding. She does have relief from the head congestion. She has tried Mucinex tablets without much relief. She was even given a steroid shot.   Asthma/Respiratory Symptom History: She remains on Smbicort. She is on two puffs twice daily. She has not had a fever at all. She is not using her rescue inhaler much at all. Debra [name]'s asthma has been well controlled. She has not required rescue medication, experienced nocturnal  awakenings due to lower respiratory symptoms, nor have activities of daily living been limited. She has required no Emergency Department or Urgent Care visits for her asthma. She has required two courses of systemic steroids for asthma exacerbations since the last visit. ACT score today is 15, indicating terrible asthma symptom control. She is two weeks late for her Berna Bue.   Allergic Rhinitis Symptom History: She remains on the nasal spray as needed as well as Allegra daily.  She has not been using any nasal saline rinses.  She did have some sinus pressure which resolved with the most recent steroid injection earlier this week at the urgent care.  Otherwise, there have been no changes to her past medical history, surgical history, family history, or social history.  She denies any COVID-19 exposures.    Review of Systems  Constitutional: Negative.  Negative for chills, fever, malaise/fatigue and weight loss.  HENT: Positive for congestion. Negative for ear discharge, ear pain and sinus pain.   Eyes: Negative for pain, discharge and redness.  Respiratory: Positive for cough. Negative for sputum production, shortness of breath and wheezing.        Positive for chest congestion.  Cardiovascular: Negative.  Negative for chest pain and palpitations.  Gastrointestinal: Negative for abdominal pain, constipation, diarrhea, heartburn, nausea and vomiting.  Skin: Negative.  Negative for itching and rash.  Neurological: Negative for dizziness and headaches.  Endo/Heme/Allergies: Negative for environmental allergies. Does not bruise/bleed easily.       Objective:   Blood pressure (!) 158/60, pulse 66, temperature 97.9 F (36.6 C), temperature source Temporal, resp. rate 16, height 5\' 1"  (1.549 m), SpO2 98 %. Body mass index is 34.2 kg/m.   Physical Exam:  Physical Exam  Constitutional: She appears well-developed.  Bubbly and interactive.  HENT:  Head: Normocephalic and atraumatic.  Right  Ear: Tympanic membrane, external ear and ear canal normal.  Left Ear: Tympanic membrane, external ear and ear canal normal.  Nose: Mucosal edema present. No rhinorrhea, nasal deformity or septal deviation. No epistaxis. Right sinus exhibits no maxillary sinus tenderness and no frontal sinus tenderness. Left sinus exhibits no maxillary sinus tenderness and no frontal sinus tenderness.  Mouth/Throat: Uvula is midline and oropharynx is clear and moist. Mucous membranes are not pale and not dry.  Eyes: Pupils are equal, round, and reactive to light. Conjunctivae and EOM are normal. Right eye exhibits no chemosis and no discharge. Left eye exhibits no chemosis and no discharge. Right conjunctiva is not injected. Left conjunctiva is not injected.  Cardiovascular: Normal rate, regular rhythm and normal heart sounds.  Respiratory: Effort normal and breath sounds normal. No accessory muscle usage. No tachypnea. No respiratory distress. She has no wheezes. She has no rhonchi. She has no rales. She exhibits no tenderness.  Coarse upper airway sounds throughout.  There is some expiratory wheezing in all lung fields.  Lymphadenopathy:    She has no cervical adenopathy.  Neurological: She  is alert.  Skin: No abrasion, no petechiae and no rash noted. Rash is not papular, not vesicular and not urticarial. No erythema. No pallor.  No eczematous or urticarial lesions noted.  Psychiatric: She has a normal mood and affect.     Diagnostic studies:    Spirometry: results abnormal (FEV1: 0.64/44%, FVC: 0.87/41%, FEV1/FVC: 74%).    Consistent with severe restriction.  Compared to her last values, her FVC is decreased from 0.92-0.87 while her FEV1 has decreased from 0.74-0.64. It should be noted that she is two weeks late on her Berna Bue.   Allergy Studies: none       Salvatore Marvel, MD  Allergy and [place] [facility] [place]

## 2019-08-15 NOTE — Patient Instructions (Addendum)
1. Asthma-COPD overlap syndrome - Lung testing looked slightly lower today. - We are not going to make any medication today, but we may consider adding another medication in the future.  - Daily controller medication(s): Fasenra 30mg  every 8 weeks and Symbicort 160/4.85mcg one puff twice daily with spacer - Prior to physical activity: ProAir 2 puffs 10-15 minutes before physical activity. - Rescue medications: ProAir 4 puffs every 4-6 hours as needed - Changes during respiratory infections or worsening symptoms: Increase Symbicort 160/4.78mcg to 2 puffs twice daily for ONE TO TWO WEEKS. - Asthma control goals:  * Full participation in all desired activities (may need albuterol before activity) * Albuterol use two time or less a week on average (not counting use with activity) * Cough interfering with sleep two time or less a month * Oral steroids no more than once a year * No hospitalizations  2. Perennial allergic rhinitis - Continue with Allegra one tablet 1-2 times daily as needed. - Continue with nasal saline rinses as needed.   3. Chronic sinusitis - We are going to add on cefdinir 300mg  twice daily for one week to hopefully wipe this out.  - We may get some screening labs to look for an immunodeficiency.  4. Return in about 3 months (around 11/15/2019). This can be an in-person, a virtual Webex or a telephone follow up visit.   Please inform us of any Emergency Department visits, hospitalizations, or changes in symptoms. Call us before going to the ED for breathing or allergy symptoms since we might be able to fit you in for a sick visit. Feel free to contact us anytime with any questions, problems, or concerns.  It was a pleasure to see you again today!  Websites that have reliable patient information: 1. American Academy of Asthma, Allergy, and Immunology: www.aaaai.org 2. Food Allergy Research and Education (FARE): foodallergy.org 3. Mothers of Asthmatics:  http://www.asthmacommunitynetwork.org 4. American College of Allergy, Asthma, and Immunology: www.acaai.org  "Like" [place] on Facebook and Instagram for our latest updates!      Make sure you are registered to vote! If you have moved or changed any of your contact information, you will need to get this updated before voting!  In some cases, you MAY be able to register to vote online: [email or web address]    Voter ID laws are NOT going into effect for the General Election in November 2020! DO NOT let this stop you from exercising your right to vote!   Absentee voting is the SAFEST way to vote during the coronavirus pandemic!   Download and print an absentee ballot request form at [email or web address] or you can scan the QR code below with your smart phone:      More information on absentee ballots can be found here: [email or web address]

## 2019-08-19 LAB — STREP PNEUMONIAE 23 SEROTYPES IGG
Pneumo Ab Type 1*: 1.5 ug/mL (ref >1.3)
Pneumo Ab Type 12 (12F)*: 2.2 ug/mL (ref >1.3)
Pneumo Ab Type 14*: 17 ug/mL (ref >1.3)
Pneumo Ab Type 17 (17F)*: 0.6 ug/mL — ABNORMAL LOW (ref >1.3)
Pneumo Ab Type 19 (19F)*: 6.7 ug/mL (ref >1.3)
Pneumo Ab Type 2*: 1.3 ug/mL — ABNORMAL LOW (ref >1.3)
Pneumo Ab Type 20*: 2.4 ug/mL (ref >1.3)
Pneumo Ab Type 22 (22F)*: 0.5 ug/mL — ABNORMAL LOW (ref >1.3)
Pneumo Ab Type 23 (23F)*: 0.2 ug/mL — ABNORMAL LOW (ref >1.3)
Pneumo Ab Type 26 (6B)*: 1.2 ug/mL — ABNORMAL LOW (ref >1.3)
Pneumo Ab Type 3*: 4.7 ug/mL (ref >1.3)
Pneumo Ab Type 34 (10A)*: 0.5 ug/mL — ABNORMAL LOW (ref >1.3)
Pneumo Ab Type 4*: 0.3 ug/mL — ABNORMAL LOW (ref >1.3)
Pneumo Ab Type 43 (11A)*: 1.4 ug/mL (ref >1.3)
Pneumo Ab Type 5*: 2.1 ug/mL (ref >1.3)
Pneumo Ab Type 51 (7F)*: 0.8 ug/mL — ABNORMAL LOW (ref >1.3)
Pneumo Ab Type 54 (15B)*: 0.4 ug/mL — ABNORMAL LOW (ref >1.3)
Pneumo Ab Type 56 (18C)*: 6.2 ug/mL (ref >1.3)
Pneumo Ab Type 57 (19A)*: 12.3 ug/mL (ref >1.3)
Pneumo Ab Type 68 (9V)*: 0.6 ug/mL — ABNORMAL LOW (ref >1.3)
Pneumo Ab Type 70 (33F)*: 0.5 ug/mL — ABNORMAL LOW (ref >1.3)
Pneumo Ab Type 8*: 0.4 ug/mL — ABNORMAL LOW (ref >1.3)
Pneumo Ab Type 9 (9N)*: 0.4 ug/mL — ABNORMAL LOW (ref >1.3)

## 2019-08-19 LAB — COMPLEMENT, TOTAL: Compl, Total (CH50): >60 U/mL (ref >41)

## 2019-08-19 LAB — IGG, IGA, IGM
IgA/Immunoglobulin A, Serum: 363 mg/dL (ref 64–422)
IgG (Immunoglobin G), Serum: 1024 mg/dL (ref 586–1602)
IgM (Immunoglobulin M), Srm: 25 mg/dL — ABNORMAL LOW (ref 26–217)

## 2019-08-19 LAB — CBC WITH DIFFERENTIAL
Basophils Absolute: 0.1 10*3/uL (ref 0.0–0.2)
Basos: 0 %
EOS (ABSOLUTE): 0 10*3/uL (ref 0.0–0.4)
Eos: 0 %
Hematocrit: 36.1 % (ref 34.0–46.6)
Hemoglobin: 12.4 g/dL (ref 11.1–15.9)
Immature Grans (Abs): 0.1 10*3/uL (ref 0.0–0.1)
Immature Granulocytes: 1 %
Lymphocytes Absolute: 1.8 10*3/uL (ref 0.7–3.1)
Lymphs: 11 %
MCH: 29.2 pg (ref 26.6–33.0)
MCHC: 34.3 g/dL (ref 31.5–35.7)
MCV: 85 fL (ref 79–97)
Monocytes Absolute: 0.7 10*3/uL (ref 0.1–0.9)
Monocytes: 4 %
Neutrophils Absolute: 13.5 10*3/uL — ABNORMAL HIGH (ref 1.4–7.0)
Neutrophils: 84 %
RBC: 4.25 x10E6/uL (ref 3.77–5.28)
RDW: 14.3 % (ref 11.7–15.4)
WBC: 16.2 10*3/uL — ABNORMAL HIGH (ref 3.4–10.8)

## 2019-08-19 LAB — DIPHTHERIA / TETANUS ANTIBODY PANEL
Diphtheria Ab: 0.42 IU/mL (ref <0.10)
Tetanus Ab, IgG: 1.34 IU/mL (ref <0.10)

## 2019-08-21 ENCOUNTER — Telehealth: Payer: Self-pay

## 2019-08-21 DIAGNOSIS — B999 Unspecified infectious disease: Secondary | ICD-10-CM

## 2019-08-21 NOTE — Telephone Encounter (Signed)
Patient complains of cough for 2 weeks and states improves for a short while sx come back her head is in a fog and feels stuffy in her nostrils. Patient is taking all medications as prescribed for abx. Patient has been doxycycline since 08/15/2019. This seems to help momentarily but then it comes back and makes her congestion worse. She explain her mucus is milky like in color then gets thick. She is taking Robitussin with honey in it to help with the congestion. Patient had a X-ray done 08/13/2019 no concerns of pneumonia.

## 2019-08-21 NOTE — Telephone Encounter (Signed)
Please advise. Also her labs have resulted.

## 2019-08-21 NOTE — Telephone Encounter (Signed)
Patient called stating she isnt feeling better and would like something sent in.  Patient feels like her head is going to explode and very stuffy. She has a lot of draining in her throat.  [facility] [address] [place]

## 2019-08-21 NOTE — Telephone Encounter (Signed)
I would make sure that she is on Mucinex-D twice daily (use for another week). I would increase her liquid intake. I would also consider getting tested for COVID-19. Were there any exposures that she knows of? Any fevers?   Make sure that she is using her nose sprays. She could use Afrin for a few days twice daily.   Salvatore Marvel, MD Allergy and [place] [facility] [place]

## 2019-08-22 NOTE — Addendum Note (Signed)
Addended by: Valere Dross on: 08/22/2019 10:28 AM   Modules accepted: Orders

## 2019-08-22 NOTE — Telephone Encounter (Signed)
Patient states it was many years ago the last time she seen an ENT. She would like one in [place]. Will they have to order the Sinus CT, or will we ?

## 2019-08-22 NOTE — Addendum Note (Signed)
Addended by: Valere Dross on: 08/22/2019 10:49 AM   Modules accepted: Orders

## 2019-08-22 NOTE — Telephone Encounter (Signed)
Has she seen ENT? We may need to get her referred for that. I would like a sinus CT as well.   Salvatore Marvel, MD Allergy and [place] [facility] [place]

## 2019-08-22 NOTE — Telephone Encounter (Signed)
Patient is scheduled for CT sinus at [facility] on 08/29/2019 @ 6 pm.

## 2019-08-22 NOTE — Telephone Encounter (Signed)
Awesome! We will get the sinus CT and then refer from that point.   Salvatore Marvel, MD Allergy and [place] [facility] [place]

## 2019-08-22 NOTE — Telephone Encounter (Signed)
CT of sinus ordered and placed to [facility]

## 2019-08-29 ENCOUNTER — Ambulatory Visit (HOSPITAL_COMMUNITY)
Admission: RE | Admit: 2019-08-29 | Discharge: 2019-08-29 | Disposition: A | Discharge status: Home / Self Care | Payer: Medicare Other | Source: Ambulatory Visit | Attending: Allergy & Immunology | Admitting: Allergy & Immunology

## 2019-08-29 ENCOUNTER — Other Ambulatory Visit: Payer: Self-pay

## 2019-08-29 DIAGNOSIS — B999 Unspecified infectious disease: Secondary | ICD-10-CM | POA: Diagnosis present

## 2019-09-03 NOTE — Telephone Encounter (Signed)
Awesome - thank you for the update.   Salvatore Marvel, MD Allergy and [place] of 

## 2019-09-03 NOTE — Telephone Encounter (Signed)
Patient did see Dr Raphael Gibney on 08/31/2019. He told the patient she would more than likely have to have a surgery due to a blockage in her nose. They are waitng the results from her CT so I have sent them over to his office.   Thanks

## 2019-09-11 DIAGNOSIS — R809 Proteinuria, unspecified: Secondary | ICD-10-CM | POA: Insufficient documentation

## 2019-09-11 DIAGNOSIS — I129 Hypertensive chronic kidney disease with stage 1 through stage 4 chronic kidney disease, or unspecified chronic kidney disease: Secondary | ICD-10-CM | POA: Insufficient documentation

## 2019-09-14 ENCOUNTER — Telehealth: Payer: Self-pay

## 2019-09-14 DIAGNOSIS — R05 Cough: Secondary | ICD-10-CM

## 2019-09-14 DIAGNOSIS — R059 Cough, unspecified: Secondary | ICD-10-CM

## 2019-09-14 DIAGNOSIS — R0602 Shortness of breath: Secondary | ICD-10-CM

## 2019-09-14 MED ORDER — AZITHROMYCIN 250 MG PO TABS: ORAL_TABLET | ORAL | 0 refills | Status: DC

## 2019-09-14 NOTE — Telephone Encounter (Signed)
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  Forwarding this to him to respond.

## 2019-09-14 NOTE — Telephone Encounter (Signed)
Dr. Nelva Bush do you have any recommendations?

## 2019-09-14 NOTE — Telephone Encounter (Signed)
Patient is calling due to her still being sick. Her mucus is milky like and still has a bad cough. She went to the urgent care today and was told to call us. She also went to the hospital last week and was given azithromycin 250 mg and a steroid injection. She cleared up some and then it came right back.  Please Advise.    [facility]

## 2019-09-14 NOTE — Telephone Encounter (Signed)
I am sending in another round of azithromycin. I would strongly encourage her to reach out to her Pulmonologist. This sounds much more like a COPD issue. We may consider changing her medications to nebulized forms as well to see if this is more efficacious.   1. Another round of azithromycin sent in. 2. Continue with albuterol 4 puffs of a DuoNeb treatment every 4 hours during the day.  3. CXR ordered. 4. Call Pulmonologist to ask for recommendations. 5. Consider changing to nebulized medications.   Salvatore Marvel, MD Allergy and [place] [facility] [place]

## 2019-09-17 NOTE — Telephone Encounter (Signed)
Call to patient to notify of message from Dr Ernst Bowler.  Pt has already had a CXR done on Friday at the urgent care in [place], [place].  Look under encounter for UC for those results. Pt is aware of medications sent in, she has already made an appointment with her pulmonologist, and will talk to them about nebulized medications.

## 2019-09-17 NOTE — Telephone Encounter (Signed)
I called patient on both numbers in chart. Unable to leave a message. I am sending the information to the patient via mychart.

## 2019-09-19 NOTE — Telephone Encounter (Signed)
Awesome! I can't wait to hear what the Pulmonologist thinks.  Salvatore Marvel, MD Allergy and [place] [facility] [place]

## 2019-10-09 DIAGNOSIS — J4550 Severe persistent asthma, uncomplicated: Secondary | ICD-10-CM | POA: Diagnosis not present

## 2019-10-10 ENCOUNTER — Ambulatory Visit (INDEPENDENT_AMBULATORY_CARE_PROVIDER_SITE_OTHER): Payer: Medicare Other

## 2019-10-10 ENCOUNTER — Other Ambulatory Visit: Payer: Self-pay

## 2019-10-10 DIAGNOSIS — J4550 Severe persistent asthma, uncomplicated: Secondary | ICD-10-CM

## 2019-10-10 DIAGNOSIS — J449 Chronic obstructive pulmonary disease, unspecified: Secondary | ICD-10-CM

## 2019-11-23 ENCOUNTER — Ambulatory Visit (INDEPENDENT_AMBULATORY_CARE_PROVIDER_SITE_OTHER): Payer: Medicare Other | Admitting: Allergy & Immunology

## 2019-11-23 ENCOUNTER — Other Ambulatory Visit: Payer: Self-pay

## 2019-11-23 ENCOUNTER — Encounter: Payer: Self-pay | Admitting: Allergy & Immunology

## 2019-11-23 VITALS — BP 152/78 | HR 68 | Temp 98.6°F | Resp 18

## 2019-11-23 DIAGNOSIS — B999 Unspecified infectious disease: Secondary | ICD-10-CM

## 2019-11-23 DIAGNOSIS — J3089 Other allergic rhinitis: Secondary | ICD-10-CM

## 2019-11-23 DIAGNOSIS — J449 Chronic obstructive pulmonary disease, unspecified: Secondary | ICD-10-CM | POA: Diagnosis not present

## 2019-11-23 NOTE — Progress Notes (Signed)
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 Asthma-COPD overlap syndrome - Lung testing looked awesome today!  - We are not going to make any medication changes.   - Daily controller medication(s): Fasenra 30mg  every 8 weeks and Breztri two puffs twice daily with spacer - Prior to physical activity: ProAir 2 puffs 10-15 minutes before physical activity. - Rescue medications: ProAir 4 puffs every 4-6 hours as needed - Asthma control goals:  * Full participation in all desired activities (may need albuterol before activity) * Albuterol use two time or less a week on average (not counting use with activity) * Cough interfering with sleep two time or less a month * Oral steroids no more than once a year * No hospitalizations  2. Perennial allergic rhinitis - Continue with Allegra one tablet 1-2 times daily as needed. - Continue with nasal saline rinses as needed.   3. Recurrent infections - We are going to get repeat Pneumococcal titers to make sure that your immune system responded to it. - We will call you in 1-2 weeks with the results of the testing.   4. Return in about 4 months (around 03/23/2020). This can be an in-person, a virtual Webex or a telephone follow up visit.   Subjective:   Debra [name] is a 73 y.o. female presenting today for follow up of  Chief Complaint  Patient presents with  . Asthma    Doing okay.     Debra [name] has a history of the following: Patient Active Problem List   Diagnosis Date Noted  . Asthma-COPD overlap syndrome ([place]) 09/28/2016  . Perennial allergic rhinitis 09/28/2016    History obtained from: chart review and patient.  Debra [name] is a 73 y.o. female presenting for a  follow up visit.  She was last seen in August 2020 for sick visit.  Her lung testing looked worse.  We continued Fasenra and Symbicort 2 puffs twice daily.  We also added on cefdinir.  She had been on prednisone for a few weeks at that time so we did not add more.  For her allergic rhinitis, we continued Allegra.    Since last visit, she has done well. She was recently seen by her pulmonologist who prescribed her Arnell Sieving, and she has been tolerating the medication very well.   Asthma/Respiratory Symptom History: She was recently changed to [facility] two puffs twice daily by her Pulmonologist. She does get SOB with physical activity. She states that she occasionally has SHOB when she goes up inclines, like stairs, but can complete her ADLs, with normal effort. She has not had any hospitalizations during her last visit and has not been on a steroid taper for a COPD exacerbation.      Allergic Rhinitis Symptom History: She remains on Allegra 1 tablet daily as well as nasal saline rinses.  She has not required any antibiotics at all since last visit.  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  No one in the nursing home has gotten COVID-19 at this point.  Otherwise, there have been no changes to her past medical history, surgical history, family history, or social history.    Review of Systems  Constitutional: Negative.  Negative  for chills, fever, malaise/fatigue and weight loss.  HENT: Negative.  Negative for congestion, ear discharge, ear pain and sinus pain.   Eyes: Negative for pain, discharge and redness.  Respiratory: Negative for cough, sputum production, shortness of breath and wheezing.   Cardiovascular: Negative.  Negative for chest pain and palpitations.  Gastrointestinal: Negative for abdominal pain, constipation, diarrhea, heartburn, nausea and vomiting.  Skin: Negative.  Negative for itching and rash.  Neurological:  Negative for dizziness and headaches.  Endo/Heme/Allergies: Negative for environmental allergies. Does not bruise/bleed easily.       Objective:   Blood pressure (!) 152/78, pulse 68, temperature 98.6 F (37 C), temperature source Temporal, resp. rate 18, SpO2 95 %. There is no height or weight on file to calculate BMI.   Physical Exam:  Physical Exam  Constitutional: She appears well-developed.  Adorable female.  Very interactive.  HENT:  Head: Normocephalic and atraumatic.  Right Ear: Tympanic membrane, external ear and ear canal normal.  Left Ear: Tympanic membrane and ear canal normal.  Nose: No mucosal edema, rhinorrhea, nasal deformity or septal deviation. No epistaxis. Right sinus exhibits no maxillary sinus tenderness and no frontal sinus tenderness. Left sinus exhibits no maxillary sinus tenderness and no frontal sinus tenderness.  Mouth/Throat: Uvula is midline and oropharynx is clear and moist. Mucous membranes are not pale and not dry.  Eyes: Pupils are equal, round, and reactive to light. Conjunctivae and EOM are normal. Right eye exhibits no chemosis and no discharge. Left eye exhibits no chemosis and no discharge. Right conjunctiva is not injected. Left conjunctiva is not injected.  Cardiovascular: Normal rate, regular rhythm and normal heart sounds.  Respiratory: Effort normal and breath sounds normal. No accessory muscle usage. No tachypnea. No respiratory distress. She has no wheezes. She has no rhonchi. She has no rales. She exhibits no tenderness.  Moving air well in all lung fields.  No increased work of breathing.  Lymphadenopathy:    She has no cervical adenopathy.  Neurological: She is alert.  Skin: No abrasion, no petechiae and no rash noted. Rash is not papular, not vesicular and not urticarial. No erythema. No pallor.  Psychiatric: She has a normal mood and affect.     Diagnostic studies:    Spirometry: results normal (FEV1: 0.78/53%, FVC: 1.45/69%,  FEV1/FVC: 53%).    Spirometry consistent with mixed obstructive and restrictive disease.   Allergy Studies: none        Salvatore Marvel, MD  Allergy and [place] [facility] [place]

## 2019-11-23 NOTE — Patient Instructions (Addendum)
1. Asthma-COPD overlap syndrome - Lung testing looked awesome today!  - We are not going to make any medication changes.   - Daily controller medication(s): Fasenra 30mg  every 8 weeks and Breztri two puffs twice daily with spacer - Prior to physical activity: ProAir 2 puffs 10-15 minutes before physical activity. - Rescue medications: ProAir 4 puffs every 4-6 hours as needed - Asthma control goals:  * Full participation in all desired activities (may need albuterol before activity) * Albuterol use two time or less a week on average (not counting use with activity) * Cough interfering with sleep two time or less a month * Oral steroids no more than once a year * No hospitalizations  2. Perennial allergic rhinitis - Continue with Allegra one tablet 1-2 times daily as needed. - Continue with nasal saline rinses as needed.   3. Recurrent infections - We are going to get repeat Pneumococcal titers to make sure that your immune system responded to it. - We will call you in 1-2 weeks with the results of the testing.   4. Return in about 4 months (around 03/23/2020). This can be an in-person, a virtual Webex or a telephone follow up visit.   Please inform us of any Emergency Department visits, hospitalizations, or changes in symptoms. Call us before going to the ED for breathing or allergy symptoms since we might be able to fit you in for a sick visit. Feel free to contact us anytime with any questions, problems, or concerns.  It was a pleasure to see you again today!  Websites that have reliable patient information: 1. American Academy of Asthma, Allergy, and Immunology: www.aaaai.org 2. Food Allergy Research and Education (FARE): foodallergy.org 3. Mothers of Asthmatics: http://www.asthmacommunitynetwork.org 4. American College of Allergy, Asthma, and Immunology: www.acaai.org  "Like" [place] on Facebook and Instagram for our latest updates!      Make sure you are registered to vote! If you  have moved or changed any of your contact information, you will need to get this updated before voting!  In some cases, you MAY be able to register to vote online: [email or web address]

## 2019-11-30 LAB — STREP PNEUMONIAE 23 SEROTYPES IGG
Pneumo Ab Type 1*: 9.1 ug/mL (ref >1.3)
Pneumo Ab Type 12 (12F)*: 2.6 ug/mL (ref >1.3)
Pneumo Ab Type 14*: 24.5 ug/mL (ref >1.3)
Pneumo Ab Type 17 (17F)*: 1 ug/mL — ABNORMAL LOW (ref >1.3)
Pneumo Ab Type 19 (19F)*: 12.5 ug/mL (ref >1.3)
Pneumo Ab Type 2*: 1.7 ug/mL (ref >1.3)
Pneumo Ab Type 20*: 4.9 ug/mL (ref >1.3)
Pneumo Ab Type 22 (22F)*: 0.7 ug/mL — ABNORMAL LOW (ref >1.3)
Pneumo Ab Type 23 (23F)*: 3.3 ug/mL (ref >1.3)
Pneumo Ab Type 26 (6B)*: >57.6 ug/mL (ref >1.3)
Pneumo Ab Type 3*: 30.6 ug/mL (ref >1.3)
Pneumo Ab Type 34 (10A)*: 1.5 ug/mL (ref >1.3)
Pneumo Ab Type 4*: 3.7 ug/mL (ref >1.3)
Pneumo Ab Type 43 (11A)*: 1.5 ug/mL (ref >1.3)
Pneumo Ab Type 5*: 17.3 ug/mL (ref >1.3)
Pneumo Ab Type 51 (7F)*: 20.5 ug/mL (ref >1.3)
Pneumo Ab Type 54 (15B)*: 1.1 ug/mL — ABNORMAL LOW (ref >1.3)
Pneumo Ab Type 56 (18C)*: >16.7 ug/mL (ref >1.3)
Pneumo Ab Type 57 (19A)*: >44.7 ug/mL (ref >1.3)
Pneumo Ab Type 68 (9V)*: >21.2 ug/mL (ref >1.3)
Pneumo Ab Type 70 (33F)*: 0.8 ug/mL — ABNORMAL LOW (ref >1.3)
Pneumo Ab Type 8*: 1.2 ug/mL — ABNORMAL LOW (ref >1.3)
Pneumo Ab Type 9 (9N)*: 2.8 ug/mL (ref >1.3)

## 2019-12-04 DIAGNOSIS — J4550 Severe persistent asthma, uncomplicated: Secondary | ICD-10-CM | POA: Diagnosis not present

## 2019-12-05 ENCOUNTER — Ambulatory Visit (INDEPENDENT_AMBULATORY_CARE_PROVIDER_SITE_OTHER): Payer: Medicare Other | Admitting: *Deleted

## 2019-12-05 ENCOUNTER — Other Ambulatory Visit: Payer: Self-pay

## 2019-12-05 DIAGNOSIS — J4550 Severe persistent asthma, uncomplicated: Secondary | ICD-10-CM

## 2020-01-09 ENCOUNTER — Telehealth: Payer: Self-pay | Admitting: Allergy & Immunology

## 2020-01-09 NOTE — Telephone Encounter (Signed)
Sample placed up front for patient. She is aware.

## 2020-01-09 NOTE — Telephone Encounter (Signed)
Patient went to pharmacy to pick up [facility] and it was $600. She cannot afford that. She is asking if we have any samples of this.

## 2020-01-30 ENCOUNTER — Telehealth: Payer: Self-pay

## 2020-01-30 ENCOUNTER — Ambulatory Visit: Payer: Medicare Other

## 2020-01-30 NOTE — Telephone Encounter (Signed)
Nurse to patient call initiated per Dr. Ernst Bowler. Patient verbalized that she was admitted to [facility] on Sunday and is COVID positive. Patient verbalizes that she is feeling "pretty good" and that she is only experiencing mild cough, back pain and weakness at this time. She stated that her doctor said she may be discharged on Friday. Patient was very appreciative for the call.

## 2020-02-01 ENCOUNTER — Ambulatory Visit: Payer: Medicare Other

## 2020-02-21 DIAGNOSIS — J4550 Severe persistent asthma, uncomplicated: Secondary | ICD-10-CM | POA: Diagnosis not present

## 2020-02-22 ENCOUNTER — Ambulatory Visit (INDEPENDENT_AMBULATORY_CARE_PROVIDER_SITE_OTHER): Payer: Medicare Other

## 2020-02-22 ENCOUNTER — Other Ambulatory Visit: Payer: Self-pay

## 2020-02-22 DIAGNOSIS — J4550 Severe persistent asthma, uncomplicated: Secondary | ICD-10-CM | POA: Diagnosis not present

## 2020-03-28 ENCOUNTER — Encounter: Payer: Self-pay | Admitting: Allergy & Immunology

## 2020-03-28 ENCOUNTER — Other Ambulatory Visit: Payer: Self-pay

## 2020-03-28 ENCOUNTER — Ambulatory Visit (INDEPENDENT_AMBULATORY_CARE_PROVIDER_SITE_OTHER): Payer: Medicare Other | Admitting: Allergy & Immunology

## 2020-03-28 VITALS — BP 138/72 | HR 60 | Temp 97.6°F | Resp 20 | Ht 61.5 in | Wt 183.8 lb

## 2020-03-28 DIAGNOSIS — J449 Chronic obstructive pulmonary disease, unspecified: Secondary | ICD-10-CM | POA: Diagnosis not present

## 2020-03-28 DIAGNOSIS — R21 Rash and other nonspecific skin eruption: Secondary | ICD-10-CM

## 2020-03-28 DIAGNOSIS — B999 Unspecified infectious disease: Secondary | ICD-10-CM | POA: Diagnosis not present

## 2020-03-28 DIAGNOSIS — J3089 Other allergic rhinitis: Secondary | ICD-10-CM

## 2020-03-28 NOTE — Patient Instructions (Addendum)
1. 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  - We are not going to make any medication changes.   - Daily controller medication(s): Fasenra 30mg  every 8 weeks and Breztri two puffs twice daily with spacer - Prior to physical activity: ProAir 2 puffs 10-15 minutes before physical activity. - Rescue medications: ProAir 4 puffs every 4-6 hours as needed - Asthma control goals:  * Full participation in all desired activities (may need albuterol before activity) * Albuterol use two time or less a week on average (not counting use with activity) * Cough interfering with sleep two time or less a month * Oral steroids no more than once a year * No hospitalizations  2. Perennial allergic rhinitis - Continue with Allegra one tablet 1-2 times daily as needed. - Continue with nasal saline rinses as needed.   3. Recurrent infections - You had a good response to your Pneumococcal vaccine testing. - I do not think that we need to do further workup at this time.   4. Return in about 4 months (around 07/28/2020). This can be an in-person, a virtual Webex or a telephone follow up visit.   Please inform us of any Emergency Department visits, hospitalizations, or changes in symptoms. Call us before going to the ED for breathing or allergy symptoms since we might be able to fit you in for a sick visit. Feel free to contact us anytime with any questions, problems, or concerns.  It was a pleasure to see you again today!  Websites that have reliable patient information: 1. American Academy of Asthma, Allergy, and Immunology: www.aaaai.org 2. Food Allergy Research and Education (FARE): foodallergy.org 3. Mothers of Asthmatics: http://www.asthmacommunitynetwork.org 4. American College of Allergy, Asthma, and Immunology: www.acaai.org   COVID-19 Vaccine Information can be found at:  [email or web address].[email or web address].uk For questions related to vaccine distribution or appointments, please email [email or web address] or call [phone].     "Like" [place] on Facebook and Instagram for our latest updates!       HAPPY SPRING!  Make sure you are registered to vote! If you have moved or changed any of your contact information, you will need to get this updated before voting!  In some cases, you MAY be able to register to vote online: [email or web address]

## 2020-03-28 NOTE — Progress Notes (Signed)
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 Bruise) - she will call on Monday to let us know what it looks like (low threshold for starting doxycycline)   Plan/Recommendations:   1. 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  - We are not going to make any medication changes.   - Daily controller medication(s): Fasenra 30mg  every 8 weeks and Breztri two puffs twice daily with spacer - Prior to physical activity: ProAir 2 puffs 10-15 minutes before physical activity. - Rescue medications: ProAir 4 puffs every 4-6 hours as needed - Asthma control goals:  * Full participation in all desired activities (may need albuterol before activity) * Albuterol use two time or less a week on average (not counting use with activity) * Cough interfering with sleep two time or less a month * Oral steroids no more than once a year * No hospitalizations  2. Perennial allergic rhinitis - Continue with Allegra one tablet 1-2 times daily as needed. - Continue with nasal saline rinses as needed.   3. Recurrent infections - You had a good response to your Pneumococcal vaccine testing. - I do not think that we need to do further workup at this time.   4. Return in about 4 months (around 07/28/2020). This can be an in-person, a virtual Webex or a telephone follow up visit.  Subjective:   Debra [name] is a 74 y.o. female presenting today for follow up of  Chief Complaint  Patient presents with  . Asthma    Gets tired and SOB when walking short distances.     Coy Guzy has a history of the following: Patient Active Problem List   Diagnosis Date Noted  . Asthma-COPD overlap syndrome ([place]) 09/28/2016  . Perennial allergic rhinitis 09/28/2016    History obtained from: chart review and patient.  Debra [name] is a 74 y.o. female presenting for a follow up visit.  She was last seen in December 2020.  At that time, lung testing looked fantastic.  We did not make any changes.  We continued with Fasenra 30 mg every 8 weeks as well as Breztri 2 puffs twice daily with a spacer.  For her rhinitis, would continue with Allegra as well as nasal saline rinses.  We obtained repeat pneumococcal titers to make sure that her immune system responded to the vaccination.  She went from 10 out of 23 protective to 18 out of 23 protective.  I did not feel that additional work-up was indicated.  Since last visit, she has mostly done well. However, she did have COVID19 and was hospitalized for a total of five days. She got the steroids and antibodies as well as the remdesivir. Her only symptoms were back and leg pain. Her respiratory symptoms were minimal. Her O2 was 94-95%. She is continuing to have pain in her legs which is better over the course of time. She can obviously still get around but she reports that they are "stiff".   Asthma/Respiratory Symptom History: Her breathing is "off and on". She can walk a certain distance and be short of breath. But then she rests and is good to go again. She can pursue her ADLs. She has been better  on her Berna Bue.  ACT score is 18, indicating subpar asthma control.  However, aside from the COVID-19 infection, she has not needed any systemic steroids.  Allergic Rhinitis Symptom History: She remains on the Allegra once daily as well as nasal saline rinses as needed.  She has not needed any antibiotics since last visit.   She also reports the development of a rash with central clearing on her left arm.  She noticed this first around 4 5 days ago.  She denies any trauma to the area.  She does tend to bruise easily, however.   She never had any tick bites or blood draws.  She does spend a lot of time outdoors, but denies any tick bites at all.  She has not had any fevers with this.  Otherwise, there have been no changes to her past medical history, surgical history, family history, or social history.    Review of Systems  Constitutional: Negative.  Negative for fever, malaise/fatigue and weight loss.  HENT: Negative.  Negative for congestion, ear discharge and ear pain.   Eyes: Negative for pain, discharge and redness.  Respiratory: Positive for cough. Negative for sputum production, shortness of breath and wheezing.   Cardiovascular: Negative.  Negative for chest pain and palpitations.  Gastrointestinal: Negative for abdominal pain, heartburn, nausea and vomiting.  Musculoskeletal: Positive for myalgias.  Skin: Negative.  Negative for itching and rash.  Neurological: Negative for dizziness and headaches.  Endo/Heme/Allergies: Negative for environmental allergies. Does not bruise/bleed easily.       Objective:   Blood pressure 138/72, pulse 60, temperature 97.6 F (36.4 C), temperature source Temporal, resp. rate 20, height 5' 1.5" (1.562 m), weight 183 lb 12.8 oz (83.4 kg), SpO2 96 %. Body mass index is 34.17 kg/m.   Physical Exam:  Physical Exam  Constitutional: She appears well-developed.  Pleasant talkative female.  HENT:  Head: Normocephalic and atraumatic.  Right Ear: Tympanic membrane, external ear and ear canal normal.  Left Ear: Tympanic membrane, external ear and ear canal normal.  Nose: Mucosal edema present. No rhinorrhea, nasal deformity or septal deviation. No epistaxis. Right sinus exhibits no maxillary sinus tenderness and no frontal sinus tenderness. Left sinus exhibits no maxillary sinus tenderness and no frontal sinus tenderness.  Mouth/Throat: Uvula is midline and oropharynx is clear and moist. Mucous membranes are not pale and not dry.  Eyes: Pupils are equal, round, and  reactive to light. Conjunctivae and EOM are normal. Right eye exhibits no chemosis and no discharge. Left eye exhibits no chemosis and no discharge. Right conjunctiva is not injected. Left conjunctiva is not injected.  Cardiovascular: Normal rate, regular rhythm and normal heart sounds.  Respiratory: Effort normal and breath sounds normal. No accessory muscle usage. No tachypnea. No respiratory distress. She has no wheezes. She has no rhonchi. She has no rales. She exhibits no tenderness.  Moving air well in all lung fields.  No increased work of breathing.  Lymphadenopathy:    She has no cervical adenopathy.  Neurological: She is alert.  Skin: No abrasion, no petechiae and no rash noted. Rash is not papular, not vesicular and not urticarial. No erythema. No pallor.  No eczematous or urticarial lesions noted.  There is one macular bull's-eye appearing bruise colored rash on the left antecubital fossa.  No oozing.  There appears to be a scab in the middle.  Psychiatric: She has a normal mood and affect.     Diagnostic studies:    Spirometry: results abnormal (FEV1:  0.83/55%, FVC: 1.05/53%, FEV1/FVC: 79%).    Spirometry consistent with possible restrictive disease.  Overall, values are slightly lower than they were at the last visit.  However, it has certainly been much worse.   Allergy Studies: None       Salvatore Marvel, MD  [facility]

## 2020-03-31 ENCOUNTER — Telehealth: Payer: Self-pay

## 2020-03-31 NOTE — Telephone Encounter (Signed)
Patient was seen on 4/9 & was told to call back about the rash/spot. Patient states it is starting to now clear up.  Thanks

## 2020-03-31 NOTE — Addendum Note (Signed)
Addended by: Cathi Roan on: 03/31/2020 02:03 PM   Modules accepted: Orders

## 2020-04-02 NOTE — Telephone Encounter (Signed)
Great.  Thank you for the update!  Jshon Ibe, MD [facility]  

## 2020-04-17 DIAGNOSIS — J4550 Severe persistent asthma, uncomplicated: Secondary | ICD-10-CM | POA: Diagnosis not present

## 2020-04-18 ENCOUNTER — Ambulatory Visit (INDEPENDENT_AMBULATORY_CARE_PROVIDER_SITE_OTHER): Payer: Medicare Other

## 2020-04-18 ENCOUNTER — Other Ambulatory Visit: Payer: Self-pay

## 2020-04-18 DIAGNOSIS — J4550 Severe persistent asthma, uncomplicated: Secondary | ICD-10-CM

## 2020-04-21 ENCOUNTER — Other Ambulatory Visit: Payer: Self-pay | Admitting: Allergy & Immunology

## 2020-06-12 DIAGNOSIS — J4550 Severe persistent asthma, uncomplicated: Secondary | ICD-10-CM | POA: Diagnosis not present

## 2020-06-13 ENCOUNTER — Ambulatory Visit (INDEPENDENT_AMBULATORY_CARE_PROVIDER_SITE_OTHER): Payer: Medicare Other

## 2020-06-13 DIAGNOSIS — J4550 Severe persistent asthma, uncomplicated: Secondary | ICD-10-CM | POA: Diagnosis not present

## 2020-08-01 ENCOUNTER — Encounter: Payer: Self-pay | Admitting: Allergy & Immunology

## 2020-08-01 ENCOUNTER — Other Ambulatory Visit: Payer: Self-pay

## 2020-08-01 ENCOUNTER — Ambulatory Visit (INDEPENDENT_AMBULATORY_CARE_PROVIDER_SITE_OTHER): Payer: Medicare Other | Admitting: Allergy & Immunology

## 2020-08-01 VITALS — BP 180/72 | HR 56 | Temp 97.0°F | Resp 16 | Wt 187.8 lb

## 2020-08-01 DIAGNOSIS — B999 Unspecified infectious disease: Secondary | ICD-10-CM | POA: Diagnosis not present

## 2020-08-01 DIAGNOSIS — J4550 Severe persistent asthma, uncomplicated: Secondary | ICD-10-CM | POA: Diagnosis not present

## 2020-08-01 DIAGNOSIS — J3089 Other allergic rhinitis: Secondary | ICD-10-CM

## 2020-08-01 NOTE — Patient Instructions (Addendum)
1. 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  - We are not going to make any medication changes.   - Things seem to be going well.  - Daily controller medication(s): Fasenra 30mg  every 8 weeks and Breztri two puffs twice daily with spacer - Prior to physical activity: ProAir 2 puffs 10-15 minutes before physical activity. - Rescue medications: ProAir 4 puffs every 4-6 hours as needed - Asthma control goals:  * Full participation in all desired activities (may need albuterol before activity) * Albuterol use two time or less a week on average (not counting use with activity) * Cough interfering with sleep two time or less a month * Oral steroids no more than once a year * No hospitalizations  2. Perennial allergic rhinitis - Continue with Allegra one tablet 1-2 times daily as needed. - Continue with Flonase one spray per nostril daily.  - Continue with nasal saline rinses as needed.   3. Return in about 4 months (around 12/01/2020).    Please inform us of any Emergency Department visits, hospitalizations, or changes in symptoms. Call us before going to the ED for breathing or allergy symptoms since we might be able to fit you in for a sick visit. Feel free to contact us anytime with any questions, problems, or concerns.  It was a pleasure to see you again today!  Websites that have reliable patient information: 1. American Academy of Asthma, Allergy, and Immunology: www.aaaai.org 2. Food Allergy Research and Education (FARE): foodallergy.org 3. Mothers of Asthmatics: http://www.asthmacommunitynetwork.org 4. American College of Allergy, Asthma, and Immunology: www.acaai.org   COVID-19 Vaccine Information can be found at: ShippingScam.co.uk For questions related to vaccine distribution or appointments, please email [email or web address] or call [phone].     "Like" [place]  on Facebook and Instagram for our latest updates!        Make sure you are registered to vote! If you have moved or changed any of your contact information, you will need to get this updated before voting!  In some cases, you MAY be able to register to vote online: [email or web address]

## 2020-08-01 NOTE — Progress Notes (Signed)
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 Bruise) - she will call on Monday to let us know what it looks like (low threshold for starting doxycycline)  Plan/Recommendations:   1. 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  - We are not going to make any medication changes.   - Things seem to be going well.  - Daily controller medication(s): Fasenra 30mg  every 8 weeks and Breztri two puffs twice daily with spacer - Prior to physical activity: ProAir 2 puffs 10-15 minutes before physical activity. - Rescue medications: ProAir 4 puffs every 4-6 hours as needed - Asthma control goals:  * Full participation in all desired activities (may need albuterol before activity) * Albuterol use two time or less a week on average (not counting use with activity) * Cough interfering with sleep two time or less a month * Oral steroids no more than once a year * No hospitalizations  2. Perennial allergic rhinitis - Continue with Allegra one tablet 1-2 times daily as needed. - Continue with Flonase one spray per nostril daily.  - Continue with nasal saline rinses as needed.   3. Return in about 4 months (around 12/01/2020).    Subjective:   Debra [name] is a 74 y.o. female presenting today for follow up of  Chief Complaint  Patient presents with  . Follow-up  . Asthma    Debra [name] has a history of the following: Patient Active Problem List   Diagnosis Date Noted  . Asthma-COPD overlap syndrome ([place])  09/28/2016  . Perennial allergic rhinitis 09/28/2016    History obtained from: chart review and patient.  Debra [name] is a 74 y.o. female presenting for a follow up visit.  She was last seen in April 2021.  At that time, her lung testing looks stable.  Since I have been seeing her, her lung testing is never looked normal.  Regardless, she was on Fasenra 30 mg every 8 weeks and Breztri 2 puffs twice daily with albuterol as needed.  For her rhinitis, we continue with Allegra 1-2 times daily as needed.  She has had a normal immune work-up in the past.  Since the last visit, she has done very well.    Asthma/Respiratory Symptom History: She is doing fairly well today.  Debra [name]'s asthma has been well controlled. She has not required rescue medication, experienced nocturnal awakenings due to lower respiratory symptoms, nor have activities of daily living been limited. She has required no Emergency Department or Urgent Care visits for her asthma. She has required zero courses of systemic steroids for asthma exacerbations since the last visit. ACT score today is 17, indicating subpar asthma symptom control. She remains on the [place].   Allergic Rhinitis Symptom History: She reports some congestion today. She is not having any rhinorrhea at all. She is not using salt water rinses. She does have saline spray that she has not been using. She does use Flonase every day religiously. She has not been on antibiotics at all since the last visit.   She is fully vaccinated to [place]. She tolerated the  injection without a problem. She did have a steorid injection earlier this week for her arthritis. Otherwise she has not required any steroids at all.   Otherwise, there have been no changes to her past medical history, surgical history, family history, or social history.    Review of Systems  Constitutional: Negative.  Negative for chills, fever, malaise/fatigue and weight loss.  HENT: Negative.  Negative for congestion,  ear discharge, ear pain and sore throat.   Eyes: Negative for pain, discharge and redness.  Respiratory: Positive for cough. Negative for sputum production, shortness of breath and wheezing.   Cardiovascular: Negative.  Negative for chest pain and palpitations.  Gastrointestinal: Negative for abdominal pain, constipation, diarrhea, heartburn, nausea and vomiting.  Skin: Negative.  Negative for itching and rash.  Neurological: Negative for dizziness and headaches.  Endo/Heme/Allergies: Negative for environmental allergies. Does not bruise/bleed easily.       Objective:   Blood pressure (!) 180/72, pulse (!) 56, temperature (!) 97 F (36.1 C), temperature source Temporal, resp. rate 16, weight 187 lb 12.8 oz (85.2 kg), SpO2 96 %. Body mass index is 34.91 kg/m.   Physical Exam:  Physical Exam Constitutional:      Appearance: She is well-developed.     Comments: Ple  HENT:     Head: Normocephalic and atraumatic.     Right Ear: Tympanic membrane, ear canal and external ear normal.     Left Ear: Tympanic membrane and ear canal normal.     Nose: No nasal deformity, septal deviation, mucosal edema or rhinorrhea.     Right Sinus: No maxillary sinus tenderness or frontal sinus tenderness.     Left Sinus: No maxillary sinus tenderness or frontal sinus tenderness.     Mouth/Throat:     Mouth: Mucous membranes are not pale and not dry.     Pharynx: Uvula midline.  Eyes:     General:        Right eye: No discharge.        Left eye: No discharge.     Conjunctiva/sclera: Conjunctivae normal.     Right eye: Right conjunctiva is not injected. No chemosis.    Left eye: Left conjunctiva is not injected. No chemosis.    Pupils: Pupils are equal, round, and reactive to light.  Cardiovascular:     Rate and Rhythm: Normal rate and regular rhythm.     Heart sounds: Normal heart sounds.  Pulmonary:     Effort: Pulmonary effort is normal. No tachypnea, accessory muscle usage or respiratory  distress.     Breath sounds: Normal breath sounds. No wheezing, rhonchi or rales.  Chest:     Chest wall: No tenderness.  Lymphadenopathy:     Cervical: No cervical adenopathy.  Skin:    Coloration: Skin is not pale.     Findings: No abrasion, erythema, petechiae or rash. Rash is not papular, urticarial or vesicular.  Neurological:     Mental Status: She is alert.      Diagnostic studies:    Spirometry: results normal (FEV1: 0.83/55%, FVC: 1.04/53%, FEV1/FVC: 80%).    Spirometry consistent with possible restrictive disease. Stabley terrible.   Allergy Studies: none        Salvatore Marvel, MD  [facility]

## 2020-08-03 ENCOUNTER — Encounter: Payer: Self-pay | Admitting: Allergy & Immunology

## 2020-08-07 DIAGNOSIS — J4550 Severe persistent asthma, uncomplicated: Secondary | ICD-10-CM | POA: Diagnosis not present

## 2020-08-08 ENCOUNTER — Ambulatory Visit (INDEPENDENT_AMBULATORY_CARE_PROVIDER_SITE_OTHER): Payer: Medicare Other

## 2020-08-08 ENCOUNTER — Other Ambulatory Visit: Payer: Self-pay

## 2020-08-08 DIAGNOSIS — J4550 Severe persistent asthma, uncomplicated: Secondary | ICD-10-CM

## 2020-08-15 ENCOUNTER — Telehealth: Payer: Self-pay | Admitting: Allergy & Immunology

## 2020-08-15 NOTE — Telephone Encounter (Signed)
Patient said she uses Breztri and last month it cost her $34, this month it is $70. She said the pharmacy told her it would go up to $150 next month. She would like to know if we have any samples she could have or if there is a copay card or something to help with the cost. She has to go to work at 11:00, she wants to know if she could be called before then.

## 2020-08-15 NOTE — Telephone Encounter (Signed)
Called and spoke with patient and she stated that it was the insurance company that told her it would be $150 next month. I explained to the patient that unfortunately coupon cards don't work for [employer]. I did advised that we can give her some samples. Patient verbalized understanding and will come to pick them up in the [place] office on 08/20/2020. Samples have been set aside for her to be sent to [facility].

## 2020-10-02 DIAGNOSIS — J4550 Severe persistent asthma, uncomplicated: Secondary | ICD-10-CM | POA: Diagnosis not present

## 2020-10-03 ENCOUNTER — Ambulatory Visit (INDEPENDENT_AMBULATORY_CARE_PROVIDER_SITE_OTHER): Payer: Medicare Other

## 2020-10-03 DIAGNOSIS — J4550 Severe persistent asthma, uncomplicated: Secondary | ICD-10-CM

## 2020-11-27 DIAGNOSIS — J4550 Severe persistent asthma, uncomplicated: Secondary | ICD-10-CM | POA: Diagnosis not present

## 2020-11-28 ENCOUNTER — Ambulatory Visit (INDEPENDENT_AMBULATORY_CARE_PROVIDER_SITE_OTHER): Payer: Medicare Other

## 2020-11-28 ENCOUNTER — Other Ambulatory Visit: Payer: Self-pay

## 2020-11-28 DIAGNOSIS — J4550 Severe persistent asthma, uncomplicated: Secondary | ICD-10-CM

## 2020-12-01 NOTE — Patient Instructions (Addendum)
Asthma/ COPD overlap Continue Fasenra 30 mg injections every 8 weeks Continue Breztri 2 puffs twice a day with spacer to help prevent cough and wheeze Continue albuterol 2 puffs every 4 hours as needed for cough, wheeze, tightness in chest, or shortness of breath.  Also may use albuterol 2 puffs 5 to 15 minutes prior to exercise. Continue to follow-up with pulmonary At your next Fasenra injection have the nurse do spirometry since our machine is down today.  Perennial allergic rhinitis Continue Allegra 1 tablet once a day to twice a day as needed for runny nose or itchy Continue Flonase nasal spray 1 spray each nostril once a day as needed for stuffy nose May use saline nasal rinse as needed for nasal symptoms.  Use this prior to any medicated nasal spray.  Please let us know if this treatment plan is not working well for you Schedule a follow-up appointment in 4 months

## 2020-12-03 ENCOUNTER — Ambulatory Visit (INDEPENDENT_AMBULATORY_CARE_PROVIDER_SITE_OTHER): Payer: Medicare Other | Admitting: Family

## 2020-12-03 ENCOUNTER — Encounter: Payer: Self-pay | Admitting: Family

## 2020-12-03 ENCOUNTER — Other Ambulatory Visit: Payer: Self-pay

## 2020-12-03 VITALS — BP 140/62 | HR 62 | Temp 97.3°F | Resp 18 | Ht 61.5 in | Wt 182.2 lb

## 2020-12-03 DIAGNOSIS — J449 Chronic obstructive pulmonary disease, unspecified: Secondary | ICD-10-CM

## 2020-12-03 DIAGNOSIS — J3089 Other allergic rhinitis: Secondary | ICD-10-CM

## 2020-12-03 DIAGNOSIS — J4550 Severe persistent asthma, uncomplicated: Secondary | ICD-10-CM | POA: Diagnosis not present

## 2020-12-03 NOTE — Progress Notes (Signed)
[place], [place] [address] Dept: [phone]  FOLLOW UP NOTE  Patient ID: Debra [name], female    DOB: [birth year]  Age: 74 y.o. MRN: [id number] Date of Office Visit: 12/03/2020  Assessment  Chief Complaint: Allergies and Asthma  HPI Debra [name] is a 74 year old female who presents today for follow-up of asthma/ COPD overlap syndrome and perennial allergic rhinitis.  He was last seen by Dr. Ernst Bowler on August 01, 2020.  Asthma/COPD overlap syndrome is reported as moderately controlled with Fasenra 30 mg injections every 8 weeks and Breztri 2 puffs twice a day with a spacer.  She reports occasional wheezing and shortness of breath with exertion.  She denies any coughing, tightness in her chest, and nocturnal awakenings..  Since her last office visit she has not required any systemic steroids or made any trips to the emergency room or urgent care due to breathing problems.  She uses her albuterol inhaler approximately 1 time a week.  She continues to follow with pulmonary in [place].  Perennial allergic rhinitis is reported as controlled with Allegra as needed, Flonase as needed and nasal saline rinses as needed.  She reports yesterday that she had burning in her nose after blowing her nose and a sore throat.  She does not have any of these symptoms today.  She reports occasional clear rhinorrhea and denies nasal congestion and postnasal drip.  Since her last office visit she has not had any sinus infections.   Drug Allergies:  Allergies  Allergen Reactions  . Aspirin     GI upset    Review of Systems: Review of Systems  Constitutional: Negative for chills and fever.  HENT:       Reports occasional clear rhinorrhea and denies nasal congestion and postnasal drip.  Eyes:       Denies itchy watery eyes  Respiratory: Positive for shortness of breath and wheezing. Negative for cough.        Reports occasional wheezing and shortness of breath with exertion   Cardiovascular: Negative for chest pain and palpitations.  Gastrointestinal: Negative for abdominal pain and heartburn.  Genitourinary: Negative for dysuria.  Skin: Negative for itching and rash.  Neurological: Negative for headaches.  Endo/Heme/Allergies: Positive for environmental allergies.    Physical Exam: BP 140/62 (BP Location: Right Arm, Patient Position: Sitting, Cuff Size: Normal)   Pulse 62   Temp (!) 97.3 F (36.3 C) (Temporal)   Resp 18   Ht 5' 1.5" (1.562 m)   Wt 182 lb 3.2 oz (82.6 kg)   SpO2 97%   BMI 33.87 kg/m    Physical Exam Constitutional:      Appearance: Normal appearance.  HENT:     Head: Normocephalic and atraumatic.     Comments: Pharynx normal, eyes normal, ears normal, nose normal    Right Ear: Tympanic membrane, ear canal and external ear normal.     Left Ear: Tympanic membrane, ear canal and external ear normal.     Nose: Nose normal.     Mouth/Throat:     Mouth: Mucous membranes are moist.     Pharynx: Oropharynx is clear.  Eyes:     Conjunctiva/sclera: Conjunctivae normal.  Cardiovascular:     Rate and Rhythm: Regular rhythm.     Heart sounds: Normal heart sounds.  Pulmonary:     Effort: Pulmonary effort is normal.     Breath sounds: Normal breath sounds.     Comments: Lungs clear to auscultation Musculoskeletal:  Cervical back: Neck supple.  Skin:    General: Skin is warm.  Neurological:     Mental Status: She is alert and oriented to person, place, and time.  Psychiatric:        Mood and Affect: Mood normal.        Behavior: Behavior normal.        Thought Content: Thought content normal.        Judgment: Judgment normal.     Diagnostics: Not completed today due to spirometry being down.  We will have completed at next [facility] injection  Assessment and Plan: 1. Severe persistent asthma without complication   2. Asthma-COPD overlap syndrome ([place])   3. Perennial allergic rhinitis     No orders of the defined types  were placed in this encounter.   Patient Instructions  Asthma/ COPD overlap Continue Fasenra 30 mg injections every 8 weeks Continue Breztri 2 puffs twice a day with spacer to help prevent cough and wheeze Continue albuterol 2 puffs every 4 hours as needed for cough, wheeze, tightness in chest, or shortness of breath.  Also may use albuterol 2 puffs 5 to 15 minutes prior to exercise. Continue to follow-up with pulmonary At your next Fasenra injection have the nurse do spirometry since our machine is down today.  Perennial allergic rhinitis Continue Allegra 1 tablet once a day to twice a day as needed for runny nose or itchy Continue Flonase nasal spray 1 spray each nostril once a day as needed for stuffy nose May use saline nasal rinse as needed for nasal symptoms.  Use this prior to any medicated nasal spray.  Please let us know if this treatment plan is not working well for you Schedule a follow-up appointment in 4 months   Return in about 4 months (around 04/03/2021), or if symptoms worsen or fail to improve.    Thank you for the opportunity to care for this patient.  Please do not hesitate to contact me with questions.  Althea Charon, FNP [facility]

## 2021-01-22 DIAGNOSIS — J4550 Severe persistent asthma, uncomplicated: Secondary | ICD-10-CM | POA: Diagnosis not present

## 2021-01-23 ENCOUNTER — Ambulatory Visit (INDEPENDENT_AMBULATORY_CARE_PROVIDER_SITE_OTHER): Payer: Medicare Other

## 2021-01-23 ENCOUNTER — Other Ambulatory Visit: Payer: Self-pay

## 2021-01-23 DIAGNOSIS — J4550 Severe persistent asthma, uncomplicated: Secondary | ICD-10-CM | POA: Diagnosis not present

## 2021-03-20 ENCOUNTER — Ambulatory Visit: Payer: Self-pay

## 2021-03-24 DIAGNOSIS — J4550 Severe persistent asthma, uncomplicated: Secondary | ICD-10-CM | POA: Diagnosis not present

## 2021-03-25 ENCOUNTER — Encounter: Payer: Self-pay | Admitting: Allergy & Immunology

## 2021-03-25 ENCOUNTER — Other Ambulatory Visit: Payer: Self-pay

## 2021-03-25 ENCOUNTER — Ambulatory Visit (INDEPENDENT_AMBULATORY_CARE_PROVIDER_SITE_OTHER): Payer: Medicare Other | Admitting: Allergy & Immunology

## 2021-03-25 VITALS — BP 160/80 | HR 56 | Temp 97.2°F | Resp 16 | Ht 62.0 in | Wt 188.2 lb

## 2021-03-25 DIAGNOSIS — J3089 Other allergic rhinitis: Secondary | ICD-10-CM

## 2021-03-25 DIAGNOSIS — B999 Unspecified infectious disease: Secondary | ICD-10-CM | POA: Diagnosis not present

## 2021-03-25 DIAGNOSIS — J4550 Severe persistent asthma, uncomplicated: Secondary | ICD-10-CM | POA: Diagnosis not present

## 2021-03-25 MED ORDER — MONTELUKAST SODIUM 10 MG PO TABS: 1.0000 | ORAL_TABLET | Freq: Every day | ORAL | 3 refills | Status: DC

## 2021-03-25 MED ORDER — VENTOLIN HFA 108 (90 BASE) MCG/ACT IN AERS: INHALATION_SPRAY | RESPIRATORY_TRACT | 1 refills | Status: DC

## 2021-03-25 NOTE — Patient Instructions (Addendum)
1. 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  - I hate to start you on a prednisone burst if this is going to be a temporary event with the pollen exposure, but start the prednisone burst that we gave you in clinic if your symptoms worsen. - Also be sure to get that Cardiology appointment scheduled!  - Daily controller medication(s): Fasenra 30mg  every 8 weeks and Breztri two puffs twice daily with spacer - Prior to physical activity: ProAir 2 puffs 10-15 minutes before physical activity. - Rescue medications: ProAir 4 puffs every 4-6 hours as needed - Asthma control goals:  * Full participation in all desired activities (may need albuterol before activity) * Albuterol use two time or less a week on average (not counting use with activity) * Cough interfering with sleep two time or less a month * Oral steroids no more than once a year * No hospitalizations  2. Perennial allergic rhinitis - Continue with Allegra one tablet 1-2 times daily as needed. - Continue with Flonase one spray per nostril daily.  - Continue with nasal saline rinses as needed.   3. Return in about 4 months (around 07/25/2021).    Please inform us of any Emergency Department visits, hospitalizations, or changes in symptoms. Call us before going to the ED for breathing or allergy symptoms since we might be able to fit you in for a sick visit. Feel free to contact us anytime with any questions, problems, or concerns.  It was a pleasure to see you again today, as always!   Websites that have reliable patient information: 1. American Academy of Asthma, Allergy, and Immunology: www.aaaai.org 2. Food Allergy Research and Education (FARE): foodallergy.org 3. Mothers of Asthmatics: http://www.asthmacommunitynetwork.org 4. American College of Allergy, Asthma, and Immunology: www.acaai.org   COVID-19 Vaccine Information can be found at:  ShippingScam.co.uk For questions related to vaccine distribution or appointments, please email vaccine@[email or web address] or call [phone].   We realize that you might be concerned about having an allergic reaction to the COVID19 vaccines. To help with that concern, WE ARE OFFERING THE COVID19 VACCINES IN OUR OFFICE! Ask the front desk for dates!     "Like" [place] on Facebook and Instagram for our latest updates!      A healthy democracy works best when [employer] participate! Make sure you are registered to vote! If you have moved or changed any of your contact information, you will need to get this updated before voting!  In some cases, you MAY be able to register to vote online: [email or web address]

## 2021-03-25 NOTE — Progress Notes (Signed)
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 Bruise) - she will call on Monday to let us know what it looks like (low threshold for starting doxycycline)  Plan/Recommendations:   1. 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  - I hate to start you on a prednisone burst if this is going to be a temporary event with the pollen exposure, but start the prednisone burst that we gave you in clinic if your symptoms worsen. - Also be sure to get that Cardiology appointment scheduled!  - Daily controller medication(s): Fasenra 30mg  every 8 weeks and Breztri two puffs twice daily with spacer - Prior to physical activity: ProAir 2 puffs 10-15 minutes before physical activity. - Rescue medications: ProAir 4 puffs every 4-6 hours as needed - Asthma control goals:  * Full participation in all desired activities (may need albuterol before activity) * Albuterol use two time or less a week on average (not counting use with activity) * Cough interfering with sleep two time or less a month * Oral steroids no more than once a year * No hospitalizations  2. Perennial allergic rhinitis - Continue with Allegra one tablet 1-2 times daily as needed. - Continue with Flonase one spray per nostril daily.  - Continue with nasal saline rinses as needed.   3. Return in about 4 months (around 07/25/2021).   Subjective:   Debra [name] is a 75 y.o. female presenting today for follow up of  Chief Complaint  Patient presents with   . Asthma    Debra [name] has a history of the following: Patient Active Problem List   Diagnosis Date Noted  . Asthma-COPD overlap syndrome ([place]) 09/28/2016  . Perennial allergic rhinitis 09/28/2016    History obtained from: chart review and patient.  Debra [name] is a 75 y.o. female presenting for a follow up visit.  She was last seen in December 2021.  At that time, we continued with Berna Bue every 8 weeks as well as Breztri 2 puffs twice daily.  We did not do spirometry because it was not working at the time.  For her allergic rhinitis, we continue with Allegra as well as Flonase.  Since last visit, she has mostly done well.   Asthma/Respiratory Symptom History: She repros that her asthma "hit her". On April 2nd, her breathing got a lot worse. She reports that she is "panting" for breath. She has been using breathing treatments which are not helping much. She tells me that her medications might be expired. 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 She sees a Film/video editor once per year. Her next cardiac visit is pending. She thinks it will be soon. She is still on her Breztri two puffs BID.    Allergic Rhinitis Symptom History: She remains on Allegra as well as Flonase.  Use nasal saline rinses as needed.  She has not needed any antibiotics in quite some time.  She has been on steroids off and on for foot issues. She did have X-rays done which were all negative. She had a checkup for gout. They are going  to check for neuropathy. Steroids did improve the symptoms. She did not need a walker and was able to ambulate.   Otherwise, there have been no changes to her past medical history, surgical history, family history, or social history.    Review of Systems  Constitutional: Negative.  Negative for chills, fever, malaise/fatigue and weight loss.  HENT: Negative for congestion, ear discharge, ear pain, nosebleeds and sinus pain.   Eyes: Negative for pain, discharge and redness.   Respiratory: Positive for cough, shortness of breath and wheezing. Negative for sputum production.   Cardiovascular: Negative.  Negative for chest pain and palpitations.  Gastrointestinal: Negative for abdominal pain, constipation, diarrhea, heartburn, nausea and vomiting.  Skin: Negative.  Negative for itching and rash.  Neurological: Negative for dizziness and headaches.  Endo/Heme/Allergies: Positive for environmental allergies. Does not bruise/bleed easily.       Objective:   Blood pressure (!) 160/80, pulse (!) 56, temperature (!) 97.2 F (36.2 C), resp. rate 16, height 5\' 2"  (1.575 m), weight 188 lb 3.2 oz (85.4 kg), SpO2 96 %. Body mass index is 34.42 kg/m.   Physical Exam:  Physical Exam Constitutional:      Appearance: She is well-developed.     Comments: Interactive.  Good humor.  HENT:     Head: Normocephalic and atraumatic.     Right Ear: Tympanic membrane, ear canal and external ear normal.     Left Ear: Tympanic membrane, ear canal and external ear normal.     Nose: No nasal deformity, septal deviation, mucosal edema or rhinorrhea.     Right Turbinates: Enlarged and swollen.     Left Turbinates: Enlarged and swollen.     Right Sinus: No maxillary sinus tenderness or frontal sinus tenderness.     Left Sinus: No maxillary sinus tenderness or frontal sinus tenderness.     Mouth/Throat:     Mouth: Mucous membranes are not pale and not dry.     Pharynx: Uvula midline.  Eyes:     General:        Right eye: No discharge.        Left eye: No discharge.     Conjunctiva/sclera: Conjunctivae normal.     Right eye: Right conjunctiva is not injected. No chemosis.    Left eye: Left conjunctiva is not injected. No chemosis.    Pupils: Pupils are equal, round, and reactive to light.  Cardiovascular:     Rate and Rhythm: Normal rate and regular rhythm.     Heart sounds: Normal heart sounds.  Pulmonary:     Effort: Pulmonary effort is normal. No tachypnea, accessory  muscle usage or respiratory distress.     Breath sounds: Normal breath sounds. No wheezing, rhonchi or rales.     Comments: Decreased air movement at the bases.  Some rhonchi appreciated, but no wheezing.  Chest:     Chest wall: No tenderness.  Lymphadenopathy:     Cervical: No cervical adenopathy.  Skin:    Coloration: Skin is not pale.     Findings: No abrasion, erythema, petechiae or rash. Rash is not papular, urticarial or vesicular.  Neurological:     Mental Status: She is alert.      Diagnostic studies:    Spirometry: results abnormal (FEV1: 0.79/51%, FVC: 1.02/50%, FEV1/FVC: 77%).    Spirometry consistent with possible restrictive disease. Overall values are stable compared to previous visits.   Allergy Studies: none        Salvatore Marvel, MD  [facility]

## 2021-05-09 ENCOUNTER — Other Ambulatory Visit: Payer: Self-pay | Admitting: Allergy & Immunology

## 2021-05-19 DIAGNOSIS — J4550 Severe persistent asthma, uncomplicated: Secondary | ICD-10-CM | POA: Diagnosis not present

## 2021-05-20 ENCOUNTER — Ambulatory Visit (INDEPENDENT_AMBULATORY_CARE_PROVIDER_SITE_OTHER): Payer: Medicare Other

## 2021-05-20 ENCOUNTER — Other Ambulatory Visit: Payer: Self-pay

## 2021-05-20 DIAGNOSIS — J4550 Severe persistent asthma, uncomplicated: Secondary | ICD-10-CM | POA: Diagnosis not present

## 2021-05-21 ENCOUNTER — Telehealth: Payer: Self-pay | Admitting: Allergy & Immunology

## 2021-05-21 ENCOUNTER — Other Ambulatory Visit: Payer: Self-pay

## 2021-05-21 MED ORDER — ALBUTEROL SULFATE HFA 108 (90 BASE) MCG/ACT IN AERS: 2.0000 | INHALATION_SPRAY | RESPIRATORY_TRACT | 1 refills | Status: DC | PRN

## 2021-05-21 NOTE — Telephone Encounter (Signed)
It appears that proair is covered so have sent in [facility]air to [facility] [place]

## 2021-05-21 NOTE — Telephone Encounter (Signed)
Pt states the insurance told her to call office cause Ventolin was not covered needs an alternative or a PA

## 2021-06-11 ENCOUNTER — Encounter: Payer: Self-pay | Admitting: Internal Medicine

## 2021-06-11 ENCOUNTER — Ambulatory Visit: Payer: Medicare Other | Admitting: Internal Medicine

## 2021-06-11 ENCOUNTER — Other Ambulatory Visit: Payer: Self-pay

## 2021-06-11 DIAGNOSIS — I10 Essential (primary) hypertension: Secondary | ICD-10-CM | POA: Insufficient documentation

## 2021-06-11 DIAGNOSIS — J449 Chronic obstructive pulmonary disease, unspecified: Secondary | ICD-10-CM | POA: Diagnosis not present

## 2021-06-11 MED ORDER — OLMESARTAN MEDOXOMIL 20 MG PO TABS: 20.0000 mg | ORAL_TABLET | Freq: Every day | ORAL | 11 refills | Status: DC

## 2021-06-11 NOTE — Progress Notes (Signed)
Debra [name], female    DOB: 03-15-1946   MRN: [id number]   Brief patient profile:  73 yobf quit smoking 2013 with onset of rhinitis/ asthma in her 80s better since rx fosenra from [facility] Spring 2021 prev under care of [place] referred to pulmonary clinic in [place]  06/11/2021 by [place]      History of Present Illness  06/11/2021  Pulmonary/ 1st office eval/ Inocencia Murtaugh / [place] [facility]  maint on breztri 2 bid / acei and prednisone per arthritis doctor/ fasenra per Dr Ernst Bowler Chief Complaint  Patient presents with   Consult    Patient reports she was diagnosed with COPD 7 years ago, 04/2012. She reports she may take a while to get some sleep.    Dyspnea:  limited by knees > sob/ able to weed eat and do most yard work  Cough: none  Sleep: sleep flat bed/ one pillow  SABA use: last used 6/20 when got hot assoc with subjective wheeze   Still on prednisone per Dr Megan Salon for arthritis   No obvious day to day or daytime variability or assoc excess/ purulent sputum or mucus plugs or hemoptysis or cp or chest tightness,   or overt sinus or hb symptoms.   Sleeping  without nocturnal  or early am exacerbation  of respiratory  c/o's or need for noct saba. Also denies any obvious fluctuation of symptoms with weather or environmental changes or other aggravating or alleviating factors except as outlined above   No unusual exposure hx or h/o childhood pna/ asthma or knowledge of premature birth.  Current Allergies, Complete Past Medical History, Past Surgical History, Family History, and Social History were reviewed in [employer] record.  ROS  The following are not active complaints unless bolded Hoarseness, sore throat, dysphagia, dental problems, itching, sneezing,  nasal congestion or discharge of excess mucus or purulent secretions, ear ache,   fever, chills, sweats, unintended wt loss or wt gain, classically pleuritic or exertional cp,   orthopnea pnd or arm/hand swelling  or leg swelling, presyncope, palpitations, abdominal pain, anorexia, nausea, vomiting, diarrhea  or change in bowel habits or change in bladder habits, change in stools or change in urine, dysuria, hematuria,  rash, arthralgias, visual complaints, headache, numbness, weakness or ataxia or problems with walking or coordination,  change in mood or  memory.           Past Medical History:  Diagnosis Date   Allergic rhinitis    Asthma    COPD (chronic obstructive pulmonary disease) (HCC)    Diabetes mellitus without complication (HCC)    Hyperlipidemia    Hypertension    Recurrent upper respiratory infection (URI)     Outpatient Medications Prior to Visit  Medication Sig Dispense Refill   albuterol (PROAIR HFA) 108 (90 Base) MCG/ACT inhaler Inhale 2 puffs into the lungs every 4 (four) hours as needed for wheezing or shortness of breath. 8 g 1   amLODipine (NORVASC) 5 MG tablet Take 5 mg by mouth daily.     aspirin 81 MG tablet Take 81 mg by mouth daily.     atorvastatin (LIPITOR) 80 MG tablet Take 80 mg by mouth daily.     Budeson-Glycopyrrol-Formoterol (BREZTRI AEROSPHERE) 160-9-4.8 MCG/ACT AERO Inhale 2 puffs into the lungs 2 (two) times daily.     citalopram (CELEXA) 10 MG tablet Take 10 mg by mouth daily.     gabapentin (NEURONTIN) 300 MG capsule 300 mg 3 (three) times daily.  hydrochlorothiazide (HYDRODIURIL) 12.5 MG tablet Take 12.5 mg by mouth daily.     ipratropium-albuterol (DUONEB) 0.5-2.5 (3) MG/3ML SOLN USE 3ML IN NEB EVERY 4 HOURS AS NEEDED  2   lisinopril (PRINIVIL,ZESTRIL) 10 MG tablet Take 10 mg by mouth daily.     metoprolol (LOPRESSOR) 100 MG tablet      montelukast (SINGULAIR) 10 MG tablet Take 1 tablet (10 mg total) by mouth daily. 30 tablet 3   predniSONE (DELTASONE) 10 MG tablet Take 10 mg by mouth as needed.     VENTOLIN HFA 108 (90 Base) MCG/ACT inhaler INHALE 2 PUFFS INTO THE LUNGS EVERY 4 (FOUR) HOURS AS NEEDED FOR WHEEZING OR  SHORTNESS OF BREATH (COUGH). 18 each 1   Vitamin D, Ergocalciferol, (DRISDOL) 1.25 MG (50000 UT) CAPS capsule TAKE 1 CAPSULE BY MOUTH MONTHLY     Facility-Administered Medications Prior to Visit  Medication Dose Route Frequency Provider Last Rate Last Admin   Benralizumab SOSY 30 mg  30 mg Subcutaneous Q8 Toney Reil, MD   30 mg at 05/20/21 2297     Objective:     BP 130/78 (Patient Position: Sitting)   Pulse (!) 55   Temp (!) 96.3 F (35.7 C) (Temporal)   Ht 5\' 2"  (1.575 m)   Wt 182 lb (82.6 kg)   SpO2 98% Comment: room air  BMI 33.29 kg/m   SpO2: 98 % (room air)  Wt Readings from Last 3 Encounters:  06/11/21 182 lb (82.6 kg)  03/25/21 188 lb 3.2 oz (85.4 kg)  12/03/20 182 lb 3.2 oz (82.6 kg)    General appearance:      Obese pleasant bf classic pw   HEENT : pt wearing mask not removed for exam due to covid -19 concerns.    NECK :  without JVD/Nodes/TM/ nl carotid upstrokes bilaterally   LUNGS: no acc muscle use,  mild/mod [place]  hump o/w Nl contour chest which is clear to A and P bilaterally without cough on insp or exp maneuvers   CV:  RRR  no s3 or murmur or increase in P2, and no edema   ABD:  obese  soft and nontender with nl inspiratory excursion in the supine position. No bruits or organomegaly appreciated, bowel sounds nl  MS:  Nl gait/ ext warm without deformities, calf tenderness, cyanosis or clubbing No obvious joint restrictions   SKIN: warm and dry without lesions    NEURO:  alert, approp, nl sensorium with  no motor or cerebellar deficits apparent.       Assessment   Asthma-COPD overlap syndrome ([place]) Quit smoking 2013 with multiple spirometries showing min obsruction/ mostly restriction  - Fosrna rx spring 2021 Ernst Bowler - Daily pred per arthritic doctor as of 1st pulmonary eval 06/11/2021 with [place] hump noted - 06/11/2021 rec trial off ACEi    Breathing worse since stopped smoking is a red flag to consider ddx of DDX  of  difficult airways management almost all start with A and  include Adherence, Ace Inhibitors, Acid Reflux, Active Sinus Disease, Alpha 1 Antitripsin deficiency, Anxiety masquerading as Airways dz,  ABPA,  Allergy(esp in young), Aspiration (esp in elderly), Adverse effects of meds,  Active smoking or vaping, A bunch of PE's (a small clot burden can't cause this syndrome unless there is already severe underlying pulm or vascular dz with poor reserve) plus two Bs  = Bronchiectasis and Beta blocker use..and one C= CHF   Adherence is always the initial "prime suspect" and  is a multilayered concern that requires a "trust but verify" approach in every patient - starting with knowing how to use medications, especially inhalers, correctly, keeping up with refills and understanding the fundamental difference between maintenance and prns vs those medications only taken for a very short course and then stopped and not refilled. - hfa teaching: 06/11/2021  After extensive coaching inhaler device,  effectiveness =    50% (short Ti)  ACEi adverse effects at the  top of the usual list of suspects and the only way to rule it out is a trial off > see a/p    Allergy > better on fosenra but also on singulair, pred and max dose ICS - of these the prednisone is the most in need of elimination but being directed by her local arthitis doctor > avised using lowest dose that controls the problem.  ? Acid (or non-acid) GERD > always difficult to exclude as up to 75% of pts in some series report no assoc GI/ Heartburn symptoms> rec consider trial  max (24h)  acid suppression and diet restriction if not improved off acei     F/u can be yearly and call sooner if rx by Dr Ernst Bowler not achieving desired goals in term of symptom control and need for rescue rx     Essential hypertension Trial off acei 06/11/2021 due to prominent wheeze   In the best review of chronic cough to date ( NEJM 2016 375 [id number]) ,  ACEi are now felt  to cause cough in up to  20% of pts which is a 4 fold increase from previous reports and does not include the variety of non-specific complaints we see in pulmonary clinic in pts on ACEi but previously attributed to another dx like  Copd/asthma and  include PNDS, throat and chest congestion, "bronchitis", unexplained dyspnea and noct "strangling" sensations, and hoarseness, but also  atypical /refractory GERD symptoms like dysphagia and "bad heartburn"   The only way I know  to prove this is not an "ACEi Case" is a trial off ACEi x a minimum of 6 weeks then regroup if "wheeze" no better depite max rx for allergy/copd   >>>  Try benicar 20 mg daily and f/u PCP          Each maintenance medication was reviewed in detail including emphasizing most importantly the difference between maintenance and prns and under what circumstances the prns are to be triggered using an action plan format where appropriate.  Total time for H and P, chart review, counseling, reviewing hfa device(s) and generating customized AVS unique to this office visit / same day charting = 45 min              Christinia Gully, MD 06/11/2021

## 2021-06-11 NOTE — Assessment & Plan Note (Signed)
Quit smoking 2013 with multiple spirometries showing min obsruction/ mostly restriction  - Fosrna rx spring 2021 Ernst Bowler - Daily pred per arthritic doctor as of 1st pulmonary eval 06/11/2021 with [place] hump noted - 06/11/2021 rec trial off ACEi    Breathing worse since stopped smoking is a red flag to consider ddx of DDX of  difficult airways management almost all start with A and  include Adherence, Ace Inhibitors, Acid Reflux, Active Sinus Disease, Alpha 1 Antitripsin deficiency, Anxiety masquerading as Airways dz,  ABPA,  Allergy(esp in young), Aspiration (esp in elderly), Adverse effects of meds,  Active smoking or vaping, A bunch of PE's (a small clot burden can't cause this syndrome unless there is already severe underlying pulm or vascular dz with poor reserve) plus two Bs  = Bronchiectasis and Beta blocker use..and one C= CHF   Adherence is always the initial "prime suspect" and is a multilayered concern that requires a "trust but verify" approach in every patient - starting with knowing how to use medications, especially inhalers, correctly, keeping up with refills and understanding the fundamental difference between maintenance and prns vs those medications only taken for a very short course and then stopped and not refilled. - hfa teaching: 06/11/2021  After extensive coaching inhaler device,  effectiveness =    50% (short Ti)  ACEi adverse effects at the  top of the usual list of suspects and the only way to rule it out is a trial off > see a/p    Allergy > better on fosenra but also on singulair, pred and max dose ICS - of these the prednisone is the most in need of elimination but being directed by her local arthitis doctor > avised using lowest dose that controls the problem.  ? Acid (or non-acid) GERD > always difficult to exclude as up to 75% of pts in some series report no assoc GI/ Heartburn symptoms> rec consider trial  max (24h)  acid suppression and diet restriction if not  improved off acei     F/u can be yearly and call sooner if rx by Dr Ernst Bowler not achieving desired goals in term of symptom control and need for rescue rx

## 2021-06-11 NOTE — Patient Instructions (Addendum)
Plan A = Automatic = Always=    breztri Take 2 puffs first thing in am and then another 2 puffs about 12 hours later.    Work on inhaler technique:  relax and gently blow all the way out then take a nice smooth full deep breath back in, triggering the inhaler at same time you start breathing in.  Hold for up to 5 seconds if you can. Blow out thru nose. Rinse and gargle with water when done.  If mouth or throat bother you at all,  try brushing teeth/gums/tongue with arm and hammer toothpaste/ make a slurry and gargle and spit out.       Plan B = Backup (to supplement plan A, not to replace it) Only use your albuterol inhaler as a rescue medication to be used if you can't catch your breath by resting or doing a relaxed purse lip breathing pattern.  - The less you use it, the better it will work when you need it. - Ok to use the inhaler up to 2 puffs  every 4 hours if you must but call for appointment if use goes up over your usual need - Don't leave home without it !!  (think of it like the spare tire for your car)   Plan C = Crisis (instead of Plan B but only if Plan B stops working) - only use your albuterol nebulizer if you first try Plan B and it fails to help > ok to use the nebulizer up to every 4 hours but if start needing it regularly call for immediate appointment    Stop lisinopril and replace with olmesartan 20 mg one daily and your wheezing should improve and let your primary doctor know 84   Please schedule a follow up visit in 12 months but call sooner if needed

## 2021-06-11 NOTE — Assessment & Plan Note (Addendum)
Trial off acei 06/11/2021 due to prominent wheeze   In the best review of chronic cough to date ( NEJM 2016 375 [id number]) ,  ACEi are now felt to cause cough in up to  20% of pts which is a 4 fold increase from previous reports and does not include the variety of non-specific complaints we see in pulmonary clinic in pts on ACEi but previously attributed to another dx like  Copd/asthma and  include PNDS, throat and chest congestion, "bronchitis", unexplained dyspnea and noct "strangling" sensations, and hoarseness, but also  atypical /refractory GERD symptoms like dysphagia and "bad heartburn"   The only way I know  to prove this is not an "ACEi Case" is a trial off ACEi x a minimum of 6 weeks then regroup if "wheeze" no better depite max rx for allergy/copd   >>>  Try benicar 20 mg daily and f/u PCP          Each maintenance medication was reviewed in detail including emphasizing most importantly the difference between maintenance and prns and under what circumstances the prns are to be triggered using an action plan format where appropriate.  Total time for H and P, chart review, counseling, reviewing hfa device(s) and generating customized AVS unique to this office visit / same day charting = 45 min

## 2021-06-15 ENCOUNTER — Other Ambulatory Visit: Payer: Self-pay | Admitting: Allergy & Immunology

## 2021-07-02 ENCOUNTER — Other Ambulatory Visit (HOSPITAL_COMMUNITY): Payer: Self-pay | Admitting: Nephrology

## 2021-07-02 DIAGNOSIS — N83299 Other ovarian cyst, unspecified side: Secondary | ICD-10-CM

## 2021-07-09 ENCOUNTER — Ambulatory Visit (HOSPITAL_COMMUNITY)
Admission: RE | Admit: 2021-07-09 | Discharge: 2021-07-09 | Disposition: A | Discharge status: Home / Self Care | Payer: Medicare Other | Source: Ambulatory Visit | Attending: Nephrology | Admitting: Nephrology

## 2021-07-09 ENCOUNTER — Other Ambulatory Visit: Payer: Self-pay

## 2021-07-09 DIAGNOSIS — N83299 Other ovarian cyst, unspecified side: Secondary | ICD-10-CM | POA: Diagnosis not present

## 2021-07-15 ENCOUNTER — Ambulatory Visit (INDEPENDENT_AMBULATORY_CARE_PROVIDER_SITE_OTHER): Payer: Medicare Other | Admitting: *Deleted

## 2021-07-15 ENCOUNTER — Ambulatory Visit: Payer: Self-pay

## 2021-07-15 ENCOUNTER — Other Ambulatory Visit: Payer: Self-pay

## 2021-07-15 DIAGNOSIS — J4550 Severe persistent asthma, uncomplicated: Secondary | ICD-10-CM | POA: Diagnosis not present

## 2021-07-16 ENCOUNTER — Telehealth: Payer: Self-pay | Admitting: *Deleted

## 2021-07-16 NOTE — Telephone Encounter (Signed)
Patient called in regards to bill she received for her Debra [name]. Patient had Ins change in April and was not advised of same so her Ins will not pick up rest.  Candace will write off same and I will replace with sample and going forward will mail her app for [facility] to try to get her on PAP free drug.

## 2021-07-22 ENCOUNTER — Ambulatory Visit: Payer: Medicare Other | Admitting: Allergy & Immunology

## 2021-07-22 ENCOUNTER — Other Ambulatory Visit: Payer: Self-pay | Admitting: Nephrology

## 2021-07-22 DIAGNOSIS — N83201 Unspecified ovarian cyst, right side: Secondary | ICD-10-CM

## 2021-07-24 ENCOUNTER — Other Ambulatory Visit: Payer: Self-pay | Admitting: Nephrology

## 2021-07-24 DIAGNOSIS — N83201 Unspecified ovarian cyst, right side: Secondary | ICD-10-CM

## 2021-07-30 ENCOUNTER — Other Ambulatory Visit: Payer: Self-pay | Admitting: Nephrology

## 2021-07-30 ENCOUNTER — Ambulatory Visit (HOSPITAL_COMMUNITY)
Admission: RE | Admit: 2021-07-30 | Discharge: 2021-07-30 | Disposition: A | Discharge status: Home / Self Care | Payer: Medicare Other | Source: Ambulatory Visit | Attending: Nephrology | Admitting: Nephrology

## 2021-07-30 ENCOUNTER — Encounter (HOSPITAL_COMMUNITY): Payer: Self-pay

## 2021-07-30 ENCOUNTER — Other Ambulatory Visit: Payer: Self-pay

## 2021-07-30 ENCOUNTER — Ambulatory Visit (HOSPITAL_COMMUNITY): Admission: RE | Admit: 2021-07-30 | Payer: Medicare Other | Source: Ambulatory Visit

## 2021-07-30 DIAGNOSIS — N83201 Unspecified ovarian cyst, right side: Secondary | ICD-10-CM

## 2021-07-31 ENCOUNTER — Ambulatory Visit (HOSPITAL_COMMUNITY): Payer: Medicare Other

## 2021-08-06 ENCOUNTER — Other Ambulatory Visit: Payer: Self-pay

## 2021-08-06 ENCOUNTER — Ambulatory Visit: Payer: Medicare Other | Admitting: Obstetrics & Gynecology

## 2021-08-06 ENCOUNTER — Encounter: Payer: Self-pay | Admitting: Obstetrics & Gynecology

## 2021-08-06 VITALS — BP 170/85 | HR 58 | Ht 62.0 in | Wt 183.4 lb

## 2021-08-06 DIAGNOSIS — N8110 Cystocele, unspecified: Secondary | ICD-10-CM

## 2021-08-06 NOTE — Progress Notes (Signed)
   GYN VISIT Patient name: Debra [name] MRN [id number]  Date of birth: 1946/07/27 Chief Complaint:   Bladder Prolapse  History of Present Illness:   Debra [name] is a 75 y.o. PM, [place] female being seen today for the following concerns:   Prolapse: She notes that about 2 mos ago- feeling increased pelvic pressure.  She denies pain, but when she stands up she can feel it more.  Denies vaginal bleeding, no discharge or itching.  She does report occasional burning with odor.   Denies urinary concerns- no frequency or incomplete voiding.  Up at night to void  No LMP recorded. Patient is postmenopausal.  Depression screen [facility] 2/9 08/06/2021 11/29/2014  Decreased Interest 0 0  Down, Depressed, Hopeless 0 0  PHQ - 2 Score 0 0  Altered sleeping 0 -  Tired, decreased energy 1 -  Change in appetite 3 -  Feeling bad or failure about yourself  0 -  Trouble concentrating 0 -  Moving slowly or fidgety/restless 0 -  Suicidal thoughts 0 -  PHQ-9 Score 4 -    Review of Systems:   Pertinent items are noted in HPI Denies fever/chills, dizziness, headaches, visual disturbances, fatigue, shortness of breath, chest pain, abdominal pain, vomiting, bowel movements, urination, or intercourse unless otherwise stated above.  Pertinent History Reviewed:  Reviewed past medical,surgical, social, obstetrical and family history.  Reviewed problem list, medications and allergies. Physical Assessment:   Vitals:   08/06/21 1049  BP: (!) 170/85  Pulse: (!) 58  Weight: 183 lb 6.4 oz (83.2 kg)  Height: '5\' 2"'$  (1.575 m)  Body mass index is 33.54 kg/m.       Physical Examination:   General appearance: alert, well appearing, and in no distress  Psych: mood appropriate, normal affect  Skin: warm & dry   Cardiovascular: normal heart rate noted  Respiratory: normal respiratory effort, no distress  Abdomen: soft, non-tender, no rebound, no guarding  Pelvic: VULVA: normal appearing vulva with no masses, tenderness or  lesions, VAGINA: normal appearing vagina with normal color and discharge, no lesions, 1/2 speculum exam performed- Stage 1 anterior prolapse noted, good apical support, no rectocele, uterus and cervix surgically absent  Extremities: no edema   Chaperone: Celene Squibb    Assessment & Plan:  1) Stage 1 cystocele -Reviewed findings, reassured pt of benign findings -reviewed conservative management and way to try to help prevent worsening -discussed management including pessary and potential surgical intervention -pt notes symptoms are minimal, plan to monitor for now, f/u if worsening of symptoms  No follow-ups on file.   Janyth Pupa, DO Attending [place], [facility] for [employer], [place]

## 2021-08-14 ENCOUNTER — Ambulatory Visit: Payer: Medicare Other | Admitting: Allergy & Immunology

## 2021-08-14 ENCOUNTER — Encounter: Payer: Self-pay | Admitting: Allergy & Immunology

## 2021-08-14 ENCOUNTER — Other Ambulatory Visit: Payer: Self-pay

## 2021-08-14 VITALS — BP 130/66 | HR 60 | Temp 97.8°F | Resp 14 | Ht 62.0 in | Wt 186.0 lb

## 2021-08-14 DIAGNOSIS — B999 Unspecified infectious disease: Secondary | ICD-10-CM

## 2021-08-14 DIAGNOSIS — J3089 Other allergic rhinitis: Secondary | ICD-10-CM | POA: Diagnosis not present

## 2021-08-14 DIAGNOSIS — J449 Chronic obstructive pulmonary disease, unspecified: Secondary | ICD-10-CM | POA: Diagnosis not present

## 2021-08-14 MED ORDER — MONTELUKAST SODIUM 10 MG PO TABS: 10.0000 mg | ORAL_TABLET | Freq: Every day | ORAL | 1 refills | Status: DC

## 2021-08-14 MED ORDER — ALBUTEROL SULFATE HFA 108 (90 BASE) MCG/ACT IN AERS: 2.0000 | INHALATION_SPRAY | RESPIRATORY_TRACT | 1 refills | Status: DC | PRN

## 2021-08-14 MED ORDER — FEXOFENADINE HCL 180 MG PO TABS: 180.0000 mg | ORAL_TABLET | Freq: Two times a day (BID) | ORAL | 5 refills | Status: DC | PRN

## 2021-08-14 MED ORDER — VENTOLIN HFA 108 (90 BASE) MCG/ACT IN AERS: INHALATION_SPRAY | RESPIRATORY_TRACT | 1 refills | Status: DC

## 2021-08-14 NOTE — Progress Notes (Signed)
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  - Daily controller medication(s): Fasenra '30mg'$  every 8 weeks and Breztri two puffs twice daily with spacer - Prior to physical activity: ProAir 2 puffs 10-15 minutes before physical activity. - Rescue medications: ProAir 4 puffs every 4-6 hours as needed - Asthma control goals:  * Full participation in all desired activities (may need albuterol before activity) * Albuterol use two time or less a week on average (not counting use with activity) * Cough interfering with sleep two time or less a month * Oral steroids no more than once a year * No hospitalizations  2. Perennial allergic rhinitis - Continue with Allegra one tablet 1-2 times daily as needed. - Continue with Flonase one spray per nostril daily.  - Continue with nasal saline rinses as needed.   3. Return in about 6 months (around 02/14/2022).    Subjective:   Debra [name] is a 75 y.o. female presenting today for follow up of No chief complaint on file.   Debra [name] has a history of the following: Patient Active Problem List   Diagnosis Date Noted   Essential hypertension 06/11/2021   Asthma-COPD overlap syndrome ([place]) 09/28/2016   Perennial allergic rhinitis 09/28/2016    History obtained from: chart review and patient.  Debra [name] is a 75 y.o. female presenting for a follow up visit.  Since the last visit, she has done well. She mostly has issues when she gets too hot and is too tired. Sometimes she starts walking and she gets tired. Other times she can  walk and not get tired. 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 She does see a Film/video editor once a year.She denies chest pain.   Asthma/Respiratory Symptom History: She has had prednisone since I last saw her. Dr. Megan Salon gave her some for her back. This was for her arthritis. She remains on the Breztri two puffs twice daily. It is not expensive. She is getting it through [facility]. THis has been a blessing for her.   She is now seeing Dr. Melvyn Novas here in [place]. She sees a kidney doctor here as well. She apparently has an ovarian cyst that has been there for a while. Now she has to go to Urology clinic for a workup.   Allergic Rhinitis Symptom History: she remains on the nose spray intermittently. She will wake up with some congestion in her head but this gets better once she gets up and moves around. Flonase helps for the most part.   She is able to mow her grass and does well with that. It will take her a couple of days to finish sometimes, but it all depends on the mood that she is in.   She is fully vaccinated against COVID-19.  Her husband recently went to the [place] with some friends and came back with COVID.  She made him stay in his shed for 2 weeks.  She would leave food on the deck and he would have to come up and get it.  She tells me not to feel too bad for  him since the shot is air conditioned and he has a TV.  Otherwise, there have been no changes to her past medical history, surgical history, family history, or social history.    Review of Systems  Constitutional: Negative.  Negative for chills, fever, malaise/fatigue and weight loss.  HENT: Negative.  Negative for congestion, ear discharge, ear pain and sore throat.   Eyes:  Negative for pain, discharge and redness.  Respiratory:  Positive for cough. Negative for sputum production, shortness of breath and wheezing.   Cardiovascular: Negative.  Negative for chest pain and palpitations.  Gastrointestinal:  Negative for abdominal  pain, constipation, diarrhea, heartburn, nausea and vomiting.  Skin: Negative.  Negative for itching and rash.  Neurological:  Negative for dizziness and headaches.  Endo/Heme/Allergies:  Negative for environmental allergies. Does not bruise/bleed easily.      Objective:   Blood pressure 130/66, pulse 60, temperature 97.8 F (36.6 C), temperature source Temporal, resp. rate 14, height '5\' 2"'$  (1.575 m), weight 186 lb (84.4 kg), SpO2 95 %. Body mass index is 34.02 kg/m.    Physical Exam Vitals reviewed.  Constitutional:      Appearance: She is well-developed.     Comments: Pleasant female.  Very talkative as always  HENT:     Head: Normocephalic and atraumatic.     Right Ear: Tympanic membrane, ear canal and external ear normal.     Left Ear: Tympanic membrane, ear canal and external ear normal.     Nose: No nasal deformity, septal deviation, mucosal edema or rhinorrhea.     Right Turbinates: Enlarged and swollen.     Left Turbinates: Enlarged and swollen.     Right Sinus: No maxillary sinus tenderness or frontal sinus tenderness.     Left Sinus: No maxillary sinus tenderness or frontal sinus tenderness.     Mouth/Throat:     Mouth: Mucous membranes are not pale and not dry.     Pharynx: Uvula midline.  Eyes:     General: Lids are normal. No allergic shiner.       Right eye: No discharge.        Left eye: No discharge.     Conjunctiva/sclera: Conjunctivae normal.     Right eye: Right conjunctiva is not injected. No chemosis.    Left eye: Left conjunctiva is not injected. No chemosis.    Pupils: Pupils are equal, round, and reactive to light.  Cardiovascular:     Rate and Rhythm: Normal rate and regular rhythm.     Heart sounds: Normal heart sounds.  Pulmonary:     Effort: Pulmonary effort is normal. No tachypnea, accessory muscle usage or respiratory distress.     Breath sounds: Normal breath sounds. No wheezing, rhonchi or rales.  Chest:     Chest wall: No tenderness.   Lymphadenopathy:     Cervical: No cervical adenopathy.  Skin:    General: Skin is warm.     Capillary Refill: Capillary refill takes less than 2 seconds.     Coloration: Skin is not pale.     Findings: No abrasion, erythema, petechiae or rash. Rash is not papular, urticarial or vesicular.  Neurological:     Mental Status: She is alert.  Psychiatric:        Behavior: Behavior is cooperative.     Diagnostic studies:    Spirometry: results abnormal (FEV1: 0.71, FVC: 1.02, FEV1/FVC: 81%).    Spirometry consistent with possible restrictive disease.  Overall, values are fairly stable compared to  last.  B treatment given in clinic with   Allergy Studies: none       Salvatore Marvel, MD  [facility] and [place] [facility] [place]

## 2021-08-14 NOTE — Patient Instructions (Addendum)
1. 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  - Daily controller medication(s): Fasenra '30mg'$  every 8 weeks and Breztri two puffs twice daily with spacer - Prior to physical activity: ProAir 2 puffs 10-15 minutes before physical activity. - Rescue medications: ProAir 4 puffs every 4-6 hours as needed - Asthma control goals:  * Full participation in all desired activities (may need albuterol before activity) * Albuterol use two time or less a week on average (not counting use with activity) * Cough interfering with sleep two time or less a month * Oral steroids no more than once a year * No hospitalizations  2. Perennial allergic rhinitis - Continue with Allegra one tablet 1-2 times daily as needed. - Continue with Flonase one spray per nostril daily.  - Continue with nasal saline rinses as needed.   3. Return in about 6 months (around 02/14/2022).    Please inform us of any Emergency Department visits, hospitalizations, or changes in symptoms. Call us before going to the ED for breathing or allergy symptoms since we might be able to fit you in for a sick visit. Feel free to contact us anytime with any questions, problems, or concerns.  It was a pleasure to see you again today, as always!   Websites that have reliable patient information: 1. American Academy of Asthma, Allergy, and Immunology: www.aaaai.org 2. Food Allergy Research and Education (FARE): foodallergy.org 3. Mothers of Asthmatics: http://www.asthmacommunitynetwork.org 4. American College of Allergy, Asthma, and Immunology: www.acaai.org   COVID-19 Vaccine Information can be found at: ShippingScam.co.uk For questions related to vaccine distribution or appointments, please email [email or web address] or call [phone].   We realize that you might be concerned about having an allergic reaction to the COVID19  vaccines. To help with that concern, WE ARE OFFERING THE COVID19 VACCINES IN OUR OFFICE! Ask the front desk for dates!     "Like" [place] on Facebook and Instagram for our latest updates!      A healthy democracy works best when [employer] participate! Make sure you are registered to vote! If you have moved or changed any of your contact information, you will need to get this updated before voting!  In some cases, you MAY be able to register to vote online: [email or web address]

## 2021-08-15 IMAGING — CT CT MAXILLOFACIAL W/O CM
3 series · 15 of 47 positions shown, 18 images · non-contrast
Comparison: None.

CLINICAL DATA: Constant nasal drip. Clinical diagnosis of recurrent
sinusitis. The patient has chronic, recurring sinusitis requiring
antibiotic treatment 3 to 4 times a year.

EXAM:
CT MAXILLOFACIAL WITHOUT CONTRAST
TECHNIQUE: Multidetector CT imaging of the maxillofacial structures was
performed. Multiplanar CT image reconstructions were also generated.

[Series 3: ax soft · axial · 0.33mm/px · z∈[+1279,+1375]mm · 9 of 56 slices shown, 12 images]
[im 4/56  brain]
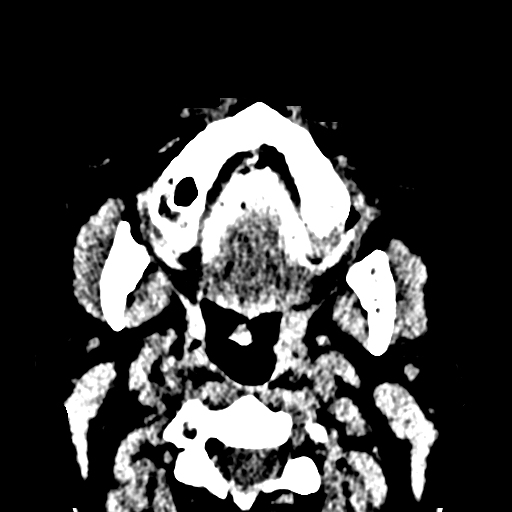
[im 4/56  bone]
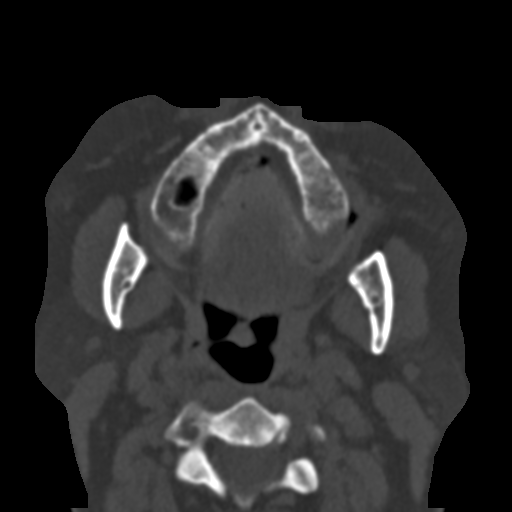
[im 10/56  bone]
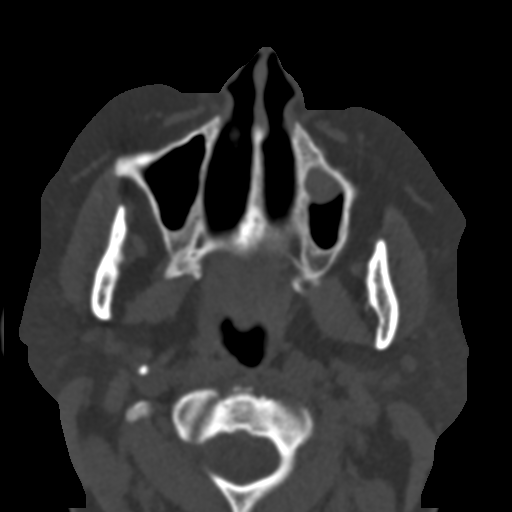
[im 16/56  bone]
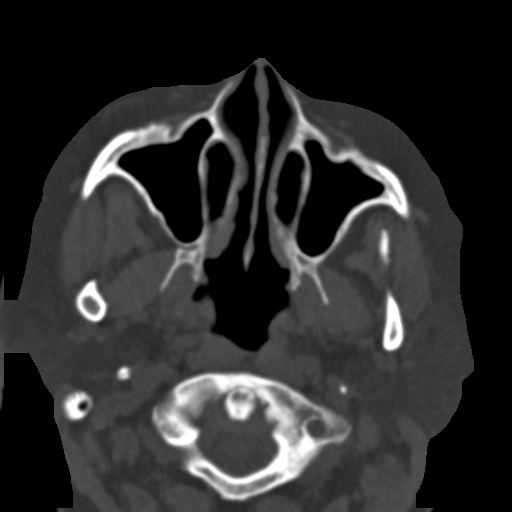
[im 21/56  bone]
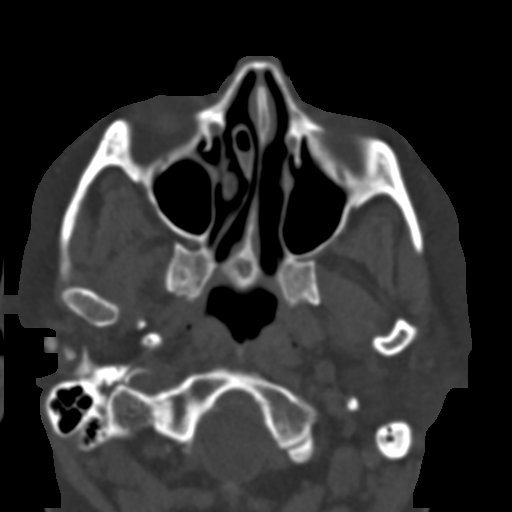
[im 29/56  brain]
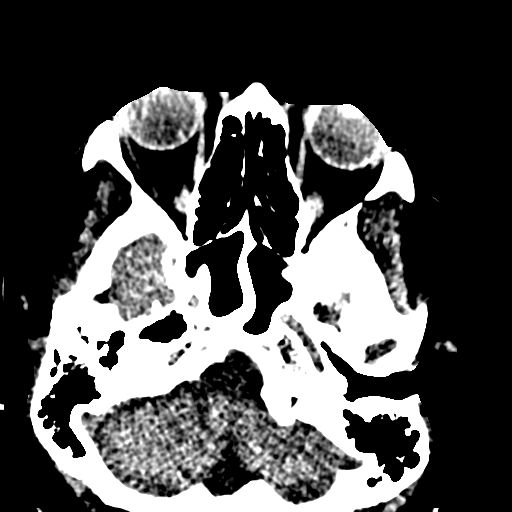
[im 29/56  bone]
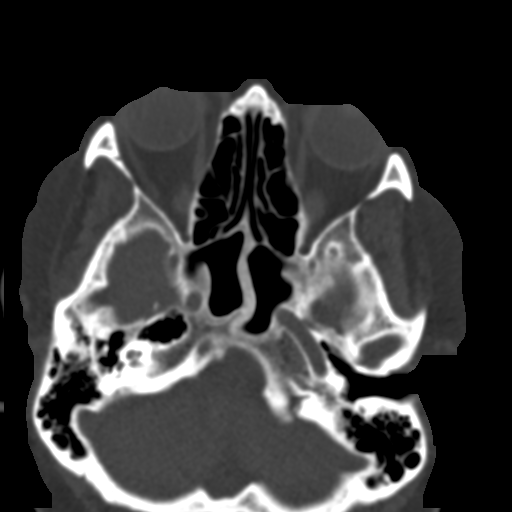
[im 35/56  bone]
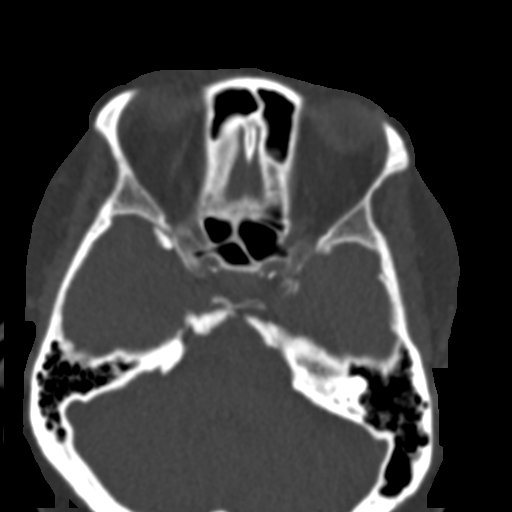
[im 40/56  bone]
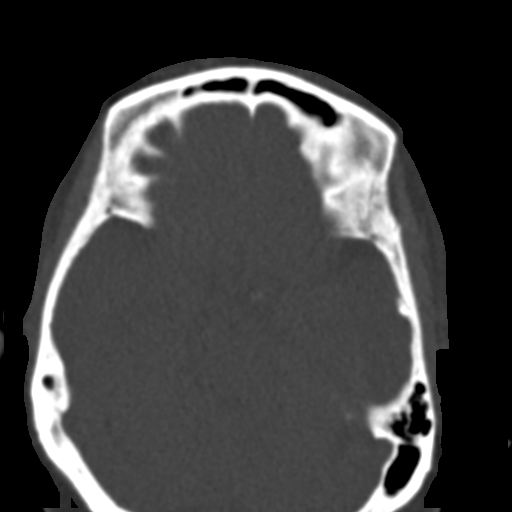
[im 46/56  bone]
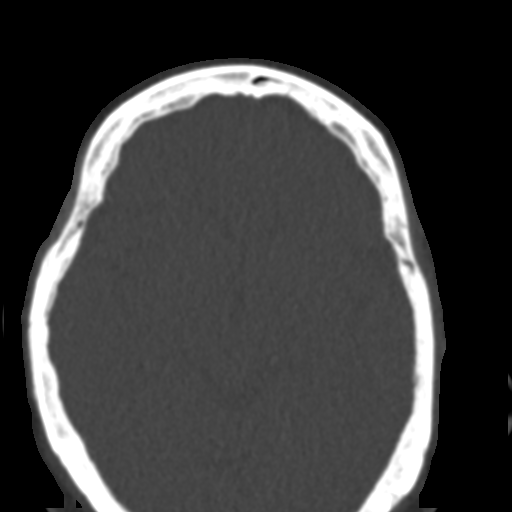
[im 52/56  brain]
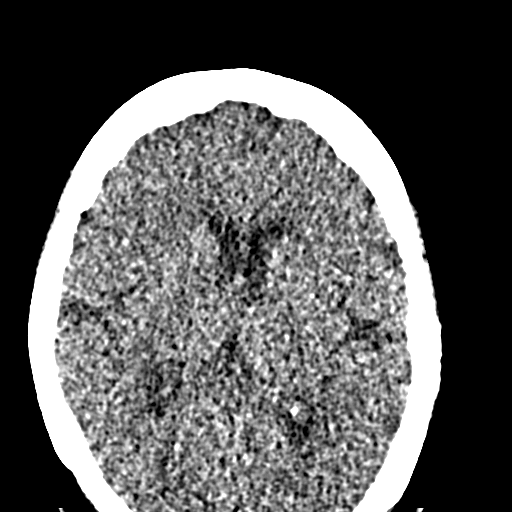
[im 52/56  bone]
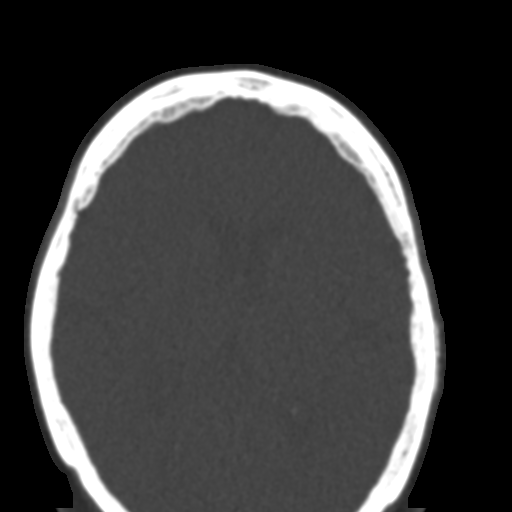

[Series 4: coronal bone · coronal · 0.24mm/px · 3 of 82 slices shown]
[im 28/82  bone]
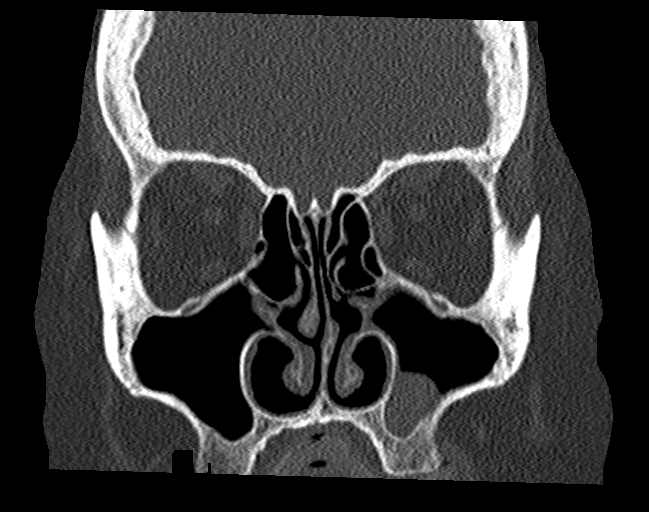
[im 37/82  bone]
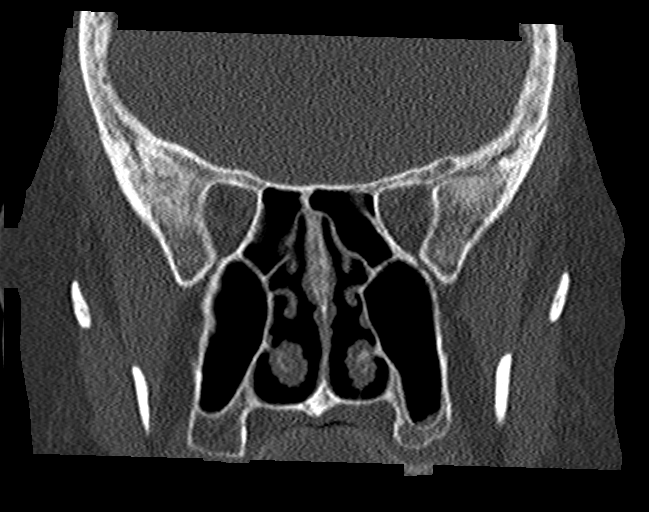
[im 46/82  bone]
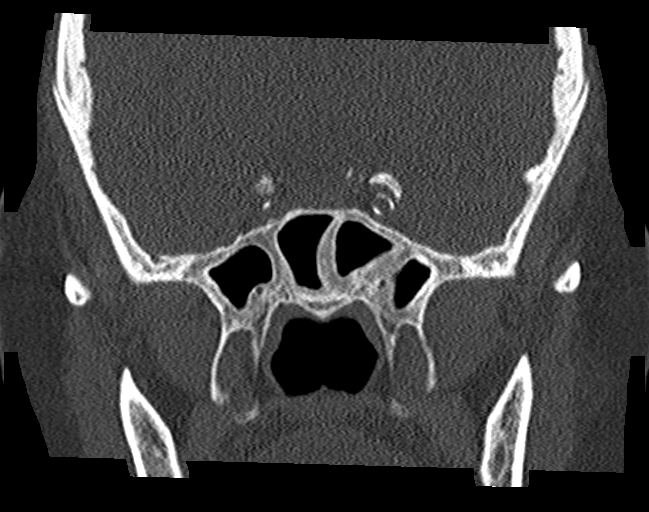

[Series 5: sagittal bone · sagittal · 0.24mm/px · 3 of 88 slices shown]
[im 30/88  bone]
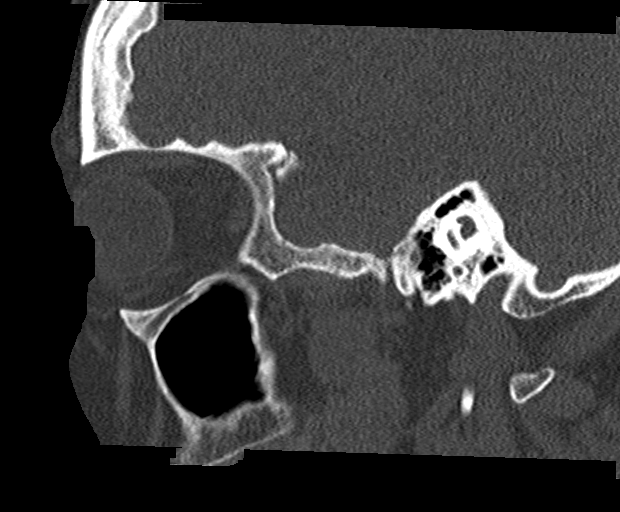
[im 44/88  bone]
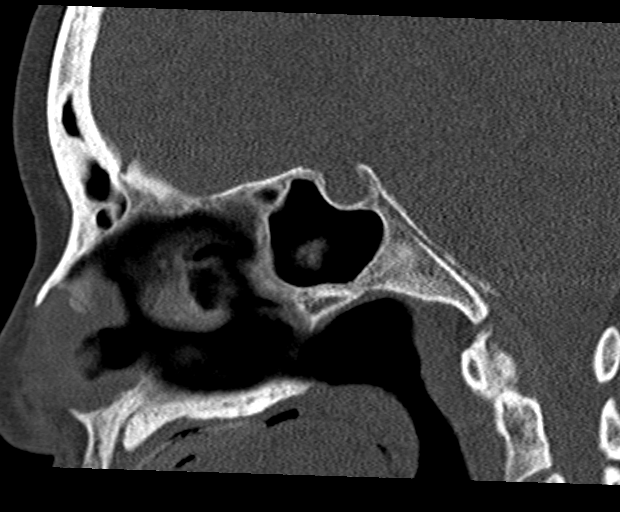
[im 59/88  bone]
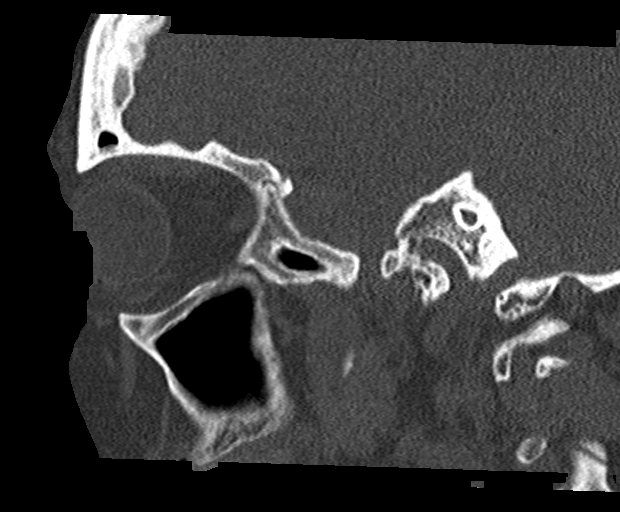

[15 of 47 positions shown; findings below may reference images not displayed]

FINDINGS: Osseous: No fracture or mandibular dislocation. No destructive
process.

Orbits: Negative. No traumatic or inflammatory finding.

Sinuses: Left inferior maxillary sinus retention cyst and mild
mucosal thickening. The remainder the paranasal sinuses are normally
pneumatized without mucosal thickening or air-fluid levels.

Soft tissues: Unremarkable.

Limited intracranial: No significant or unexpected finding.
IMPRESSION: Minimal chronic left maxillary sinusitis with an associated
retention cyst. Otherwise, normal examination.

## 2021-09-09 ENCOUNTER — Other Ambulatory Visit: Payer: Self-pay

## 2021-09-09 ENCOUNTER — Ambulatory Visit (INDEPENDENT_AMBULATORY_CARE_PROVIDER_SITE_OTHER): Payer: Medicare Other

## 2021-09-09 DIAGNOSIS — J4550 Severe persistent asthma, uncomplicated: Secondary | ICD-10-CM | POA: Diagnosis not present

## 2021-09-15 ENCOUNTER — Telehealth: Payer: Self-pay

## 2021-09-15 NOTE — Telephone Encounter (Signed)
I received patients [employer] form for Fasenra. I have called the patient to let her know we received it. Form has been faxed to [facility] for review.

## 2021-09-23 NOTE — Telephone Encounter (Signed)
Called patient and advised approval from [facility] for free drug Berna Bue

## 2021-09-25 ENCOUNTER — Ambulatory Visit: Payer: Medicare Other | Admitting: Urology

## 2021-10-21 ENCOUNTER — Other Ambulatory Visit: Payer: Self-pay

## 2021-10-21 ENCOUNTER — Ambulatory Visit: Payer: Medicare Other | Admitting: Urology

## 2021-10-21 ENCOUNTER — Encounter: Payer: Self-pay | Admitting: Urology

## 2021-10-21 VITALS — BP 133/65 | HR 60

## 2021-10-21 DIAGNOSIS — N281 Cyst of kidney, acquired: Secondary | ICD-10-CM

## 2021-10-21 LAB — MICROSCOPIC EXAMINATION
RBC, Urine: NONE SEEN /hpf (ref 0–2)
Renal Epithel, UA: NONE SEEN /hpf

## 2021-10-21 LAB — URINALYSIS, ROUTINE W REFLEX MICROSCOPIC
Bilirubin, UA: NEGATIVE
Glucose, UA: NEGATIVE
Ketones, UA: NEGATIVE
Nitrite, UA: NEGATIVE
Protein,UA: NEGATIVE
RBC, UA: NEGATIVE
Specific Gravity, UA: 1.02 (ref 1.005–1.030)
Urobilinogen, Ur: 0.2 mg/dL (ref 0.2–1.0)
pH, UA: 6 (ref 5.0–7.5)

## 2021-10-21 NOTE — Progress Notes (Signed)
Urological Symptom Review  Patient is experiencing the following symptoms: Get up at night to urinate   Review of Systems  Gastrointestinal (upper)  : Negative for upper GI symptoms  Gastrointestinal (lower) : Negative for lower GI symptoms  Constitutional : Negative for symptoms  Skin: Negative for skin symptoms  Eyes: Negative for eye symptoms  Ear/Nose/Throat : Sinus problems  Hematologic/Lymphatic: Negative for Hematologic/Lymphatic symptoms  Cardiovascular : Leg swelling  Respiratory : Shortness of breath  Endocrine: Negative for endocrine symptoms  Musculoskeletal: Back pain  Neurological: Headaches  Psychologic: Negative for psychiatric symptoms

## 2021-10-21 NOTE — Progress Notes (Signed)
10/21/2021 10:33 AM   Debra [name] 31-Mar-1946 [id number]  Referring provider: Alfonse Flavors, MD [address] [place],  [place] [address]  Renal cysts  HPI: Debra [name] is a 75yo her for evaluation of a right renal cyst. She underwent abdominal US 06/2021 which showed a 1.9cm right renal cyst. On [place] there were no septations and no solid components. She denies any right flank pain. She denies any LUTS. UA today shows no RBCs. No other complaints today   PMH: Past Medical History:  Diagnosis Date   Allergic rhinitis    Asthma    Chronic kidney disease (CKD)    COPD (chronic obstructive pulmonary disease) (HCC)    Diabetes mellitus without complication (HCC)    Hyperlipidemia    Hypertension    Hypertensive chronic kidney disease    Recurrent upper respiratory infection (URI)    Secondary hyperparathyroidism of renal origin [facility])     Surgical History: Past Surgical History:  Procedure Laterality Date   CHOLECYSTECTOMY     CORONARY ARTERY BYPASS GRAFT     PARTIAL HYSTERECTOMY  1981    Home Medications:  Allergies as of 10/21/2021       Reactions   Aspirin Other (See Comments)   GI upset and burns per patient.         Medication List        Accurate as of October 21, 2021 10:33 AM. If you have any questions, ask your nurse or doctor.          amLODipine 5 MG tablet Commonly known as: NORVASC Take 5 mg by mouth daily.   aspirin 81 MG tablet Take 81 mg by mouth daily.   atorvastatin 80 MG tablet Commonly known as: LIPITOR Take 80 mg by mouth daily.   Breztri Aerosphere 160-9-4.8 MCG/ACT Aero Generic drug: Budeson-Glycopyrrol-Formoterol Inhale 2 puffs into the lungs 2 (two) times daily.   calcium citrate-vitamin D 315-200 MG-UNIT tablet Commonly known as: CITRACAL+D Take 1 tablet by mouth 2 (two) times daily.   citalopram 20 MG tablet Commonly known as: CELEXA Take 20 mg by mouth daily. What changed: Another medication with the same  name was removed. Continue taking this medication, and follow the directions you see here. Changed by: Nicolette Bang, MD   co-enzyme Q-10 30 MG capsule Take 30 mg by mouth 3 (three) times daily.   Fasenra 30 MG/ML Sosy Generic drug: Benralizumab Inject into the skin.   fexofenadine 180 MG tablet Commonly known as: ALLEGRA Take 1 tablet (180 mg total) by mouth 2 (two) times daily as needed for allergies or rhinitis.   fluticasone 50 MCG/ACT nasal spray Commonly known as: FLONASE Place into both nostrils daily.   furosemide 20 MG tablet Commonly known as: LASIX Take 20 mg by mouth daily as needed.   gabapentin 400 MG capsule Commonly known as: NEURONTIN Take 400 mg by mouth 3 (three) times daily.   hydrochlorothiazide 12.5 MG tablet Commonly known as: HYDRODIURIL Take 12.5 mg by mouth daily.   ipratropium-albuterol 0.5-2.5 (3) MG/3ML Soln Commonly known as: DUONEB USE 3ML IN NEB EVERY 4 HOURS AS NEEDED   lisinopril 10 MG tablet Commonly known as: ZESTRIL Take 10 mg by mouth daily.   metoprolol tartrate 100 MG tablet Commonly known as: LOPRESSOR   montelukast 10 MG tablet Commonly known as: SINGULAIR Take 1 tablet (10 mg total) by mouth daily.   nystatin 100000 UNIT/ML suspension Commonly known as: MYCOSTATIN PLEASE SEE ATTACHED FOR DETAILED DIRECTIONS  olmesartan 20 MG tablet Commonly known as: BENICAR Take 1 tablet (20 mg total) by mouth daily.   predniSONE 10 MG tablet Commonly known as: DELTASONE Take 10 mg by mouth as needed.   Triamcinolone Acetonide 0.025 % Lotn Apply topically.   Ventolin HFA 108 (90 Base) MCG/ACT inhaler Generic drug: albuterol INHALE 2 PUFFS INTO THE LUNGS EVERY 4 (FOUR) HOURS AS NEEDED FOR WHEEZING OR SHORTNESS OF BREATH (COUGH).   albuterol 108 (90 Base) MCG/ACT inhaler Commonly known as: ProAir HFA Inhale 2 puffs into the lungs every 4 (four) hours as needed for wheezing or shortness of breath.   vitamin C 500 MG  tablet Commonly known as: ASCORBIC ACID Take 500 mg by mouth daily.   Vitamin D (Ergocalciferol) 1.25 MG (50000 UNIT) Caps capsule Commonly known as: DRISDOL TAKE 1 CAPSULE BY MOUTH MONTHLY   VOLTAREN OP Apply to eye.        Allergies:  Allergies  Allergen Reactions   Aspirin Other (See Comments)    GI upset and burns per patient.     Family History: Family History  Problem Relation Age of Onset   Cancer Father    Stroke Mother    Heart disease Brother    Allergic rhinitis Neg Hx    Asthma Neg Hx    Eczema Neg Hx    Immunodeficiency Neg Hx    Urticaria Neg Hx    Angioedema Neg Hx    Atopy Neg Hx     Social History:  reports that she quit smoking about 9 years ago. Her smoking use included cigarettes. She has never used smokeless tobacco. She reports that she does not drink alcohol and does not use drugs.  ROS: All other review of systems were reviewed and are negative except what is noted above in HPI  Physical Exam: BP 133/65   Pulse 60   Constitutional:  Alert and oriented, No acute distress. HEENT: [place] AT, moist mucus membranes.  Trachea midline, no masses. Cardiovascular: No clubbing, cyanosis, or edema. Respiratory: Normal respiratory effort, no increased work of breathing. GI: Abdomen is soft, nontender, nondistended, no abdominal masses GU: No CVA tenderness.  Lymph: No cervical or inguinal lymphadenopathy. Skin: No rashes, bruises or suspicious lesions. Neurologic: Grossly intact, no focal deficits, moving all 4 extremities. Psychiatric: Normal mood and affect.  Laboratory Data: Lab Results  Component Value Date   WBC 16.2 (H) 08/15/2019   HGB 12.4 08/15/2019   HCT 36.1 08/15/2019   MCV 85 08/15/2019   PLT 325 12/19/2017    Lab Results  Component Value Date   CREATININE 1.28 (H) 12/19/2017    No results found for: PSA  No results found for: TESTOSTERONE  No results found for: HGBA1C  Urinalysis No results found for: COLORURINE,  APPEARANCEUR, LABSPEC, PHURINE, GLUCOSEU, HGBUR, BILIRUBINUR, KETONESUR, PROTEINUR, UROBILINOGEN, NITRITE, LEUKOCYTESUR  No results found for: LABMICR, [place], RBCUA, LABEPIT, MUCUS, BACTERIA  Pertinent Imaging: Abdominal US 06/2021: Images reviewed and discussed with the patient No results found for this or any previous visit.  No results found for this or any previous visit.  Results for orders placed during the hospital encounter of 07/30/21  US ABDOMINAL PELVIC ART/VENT FLOW DOPPLER  Narrative CLINICAL DATA:  RIGHT ovarian cyst seen on renal ultrasound, postmenopausal  EXAM: TRANSABDOMINAL ULTRASOUND OF PELVIS  DOPPLER ULTRASOUND OF OVARIES  TECHNIQUE: Transabdominal ultrasound examination of the pelvis was performed including evaluation of the uterus, ovaries, adnexal regions, and pelvic cul-de-sac.  Color and duplex Doppler ultrasound was  utilized to evaluate blood flow to the ovaries.  COMPARISON:  None  FINDINGS: Uterus  Surgically absent  Endometrium  Surgically absent  Right ovary  Measurements: 6.1 x 4.4 x 5.0 cm = volume: 69 mL. Cyst identified within RIGHT ovary 4.7 x 3.2 x 3.2 cm containing a few scattered internal echoes. No definite mural nodularity or septations.  Left ovary  Not visualized, question surgically absent versus obscured by bowel  Pulsed Doppler evaluation demonstrates normal low-resistance arterial and venous waveforms in the RIGHT ovary. LEFT ovary is not visualized for Doppler assessment.  Other: No free pelvic fluid or other pelvic masses.  IMPRESSION: Post hysterectomy with nonvisualization of LEFT ovary.  Cyst within RIGHT ovary containing a few scattered internal echoes, 4.7 cm greatest size; Recommend followup US in 3-6 months. Note: This recommendation does not apply to premenarchal patients or to those with increased risk (genetic, family history, elevated tumor markers or other high-risk factors) of ovarian  cancer. Reference: Radiology 2019 Nov; 293(2):359-371.   Electronically Signed By: Lavonia Dana M.D. On: 07/30/2021 17:03  No results found for this or any previous visit.  Results for orders placed during the hospital encounter of 05/14/16  US Renal  Narrative CLINICAL DATA:  Chronic kidney disease stage 3  EXAM: RENAL / URINARY TRACT ULTRASOUND COMPLETE  COMPARISON:  None.  FINDINGS: Right Kidney:  Length: 10.5 cm. There is cortical thinning probable due to atrophy. A cyst is noted in lower pole measures 0.8 x 1.2 cm. No hydronephrosis.  Left Kidney:  Length: 10.4 cm in length. Normal echogenicity. There is cortical thinning probable due to atrophy. No hydronephrosis. No renal mass.  Bladder:  The visualized urinary bladder is unremarkable. No urinary jets are noted. There is a complex cystic lesion in right lower quadrant. This may represent a complex ovarian cyst. Correlation with GYN exam and pelvic ultrasound is recommended. Measures 5.5 x 4.9 cm  IMPRESSION: 1. No hydronephrosis. Bilateral renal cortical thinning probable due to atrophy. There is a cyst in lower pole of the right kidney measures 1.2 cm. 2. The visualized urinary bladder is unremarkable. No urinary jets are noted. There is a complex cystic lesion in right lower quadrant. This may represent a complex ovarian cyst. Measures 5.5 x 4.9 cm. Correlation with GYN exam and pelvic ultrasound is recommended.   Electronically Signed By: Lahoma Crocker M.D. On: 05/14/2016 11:47  No results found for this or any previous visit.  No results found for this or any previous visit.  No results found for this or any previous visit.   Assessment & Plan:    1. Kidney cysts -We discussed the benign nature of renal cysts. I will see her ack in 1 year with a renal US - Urinalysis, Routine w reflex microscopic   No follow-ups on file.  Nicolette Bang, MD  [facility] [place]

## 2021-10-21 NOTE — Patient Instructions (Signed)
Renal Mass  A renal mass is an abnormal growth in the kidney. It may be found while performing an MRI, CT scan, or ultrasound to evaluate other problems of the abdomen. A renal mass that is cancerous (malignant) may grow or spread quickly. Others are not cancerous (benign). Renal masses include: Tumors. These may be malignant or benign. The most common type of kidney cancer in adults is renal cell carcinoma. In children, the most common type of kidney cancer is Wilms tumor. The most common benign tumors of the kidney include renal adenomas, oncocytomas, and angiomyolipoma (AML). Cysts. These are fluid-filled sacs that form on or in the kidney. What are the causes? Certain types of cancers, infections, or injuries can cause a renal mass. It isnot always known what causes a cyst to develop in or on the kidney. What are the signs or symptoms? Often, a renal mass does not cause any signs or symptoms; most kidney cysts donot cause symptoms. How is this diagnosed? Your health care provider may recommend tests to diagnose the cause of your renal mass. These tests may be done if a renal mass is found: Physical exam. Blood tests. Urine tests. Imaging tests, such as ultrasound, CT scan, or MRI. Biopsy. This is a small sample that is removed from the renal mass and tested in a lab. The exact tests and how often they are done will depend on: The size and appearance of the renal mass. Risk factors or medical conditions that increase your risk for problems. Any symptoms associated with the renal mass, or concerns that you have about it. Tests and physical exams may be done once, or they may be done regularly for a period of time. Tests and exams that are done regularly will help monitorwhether the mass is growing and beginning to cause problems. How is this treated? Treatment is not always needed for this condition. Your health care provider may recommend careful monitoring and regular tests and exams.  Treatment willdepend on the cause of the mass. Treatment for a cancerous renal mass may include surgical removal,chemotherapy, radiation, or immunotherapy. Most kidney cysts do not need to be treated. Follow these instructions at home: What you need to do at home will depend on the cause of the mass. Follow the instructions that your health care provider gives to you. In general: Take over-the-counter and prescription medicines only as told by your health care provider. If you were prescribed an antibiotic medicine, take it as told by your health care provider. Do not stop taking the antibiotic even if you start to feel better. Follow any restrictions that are given to you by your health care provider. Keep all follow-up visits. This is important. You may need to see your health care provider once or twice a year to have CT scans and ultrasounds. These tests will show if your renal mass has changed or grown. Contact a health care provider if you: Have pain in your side or back (flank pain). Have a fever. Feel full soon after eating. Have pain or swelling in the abdomen. Lose weight. Get help right away if: Your pain gets worse. There is blood in your urine. You cannot urinate. You have chest pain. You have trouble breathing. These symptoms may represent a serious problem that is an emergency. Do not wait to see if the symptoms will go away. Get medical help right away. Call your local emergency services (911 in the [place] Summary A renal mass is an abnormal growth in the kidney.   It may be cancerous (malignant) and grow or spread quickly, or it may not be cancerous (benign). Renal masses often do not have any signs or symptoms. Renal masses may be found while performing an MRI, CT scan, or ultrasound for other problems of the abdomen. Your health care provider may recommend that you have tests to diagnose the cause of your renal mass. These may include a physical exam, blood tests, urine  tests, imaging, or a biopsy. Treatment is not always needed for this condition. Careful monitoring may be recommended. This information is not intended to replace advice given to you by your health care provider. Make sure you discuss any questions you have with your healthcare provider. Document Revised: 06/02/2020 Document Reviewed: 06/02/2020 Elsevier Patient Education  2022 [employer]  

## 2021-10-29 ENCOUNTER — Telehealth: Payer: Self-pay | Admitting: Allergy & Immunology

## 2021-10-29 ENCOUNTER — Other Ambulatory Visit: Payer: Self-pay | Admitting: Internal Medicine

## 2021-10-29 MED ORDER — FLUTICASONE PROPIONATE HFA 110 MCG/ACT IN AERO: 2.0000 | INHALATION_SPRAY | Freq: Two times a day (BID) | RESPIRATORY_TRACT | 3 refills | Status: DC

## 2021-10-29 NOTE — Telephone Encounter (Signed)
Patient called and said that she started getting a cold 2 days ago and now she is coughing up thick stuff and she is congested . She needs something before it gets worse [facility] [phone].,

## 2021-10-29 NOTE — Telephone Encounter (Signed)
Called and spoke with the patient and she denies any fever. She states that her breathing is ok at the moment but she is worried that it will get worse. She states that right now the mucous is thick and white in color. She states that it is mainly in her head and her chest. Will you please advise recommendation on behalf of Dr. Ernst Bowler?

## 2021-10-29 NOTE — Progress Notes (Signed)
Encounter created for flovent 110 mcg Rx.

## 2021-10-30 ENCOUNTER — Emergency Department (HOSPITAL_COMMUNITY): Payer: Medicare Other

## 2021-10-30 ENCOUNTER — Encounter (HOSPITAL_COMMUNITY): Payer: Self-pay | Admitting: *Deleted

## 2021-10-30 ENCOUNTER — Emergency Department (HOSPITAL_COMMUNITY)
Admission: EM | Admit: 2021-10-30 | Discharge: 2021-10-30 | Disposition: A | Discharge status: Home / Self Care | Payer: Medicare Other | Attending: Emergency Medicine | Admitting: Emergency Medicine

## 2021-10-30 DIAGNOSIS — E1122 Type 2 diabetes mellitus with diabetic chronic kidney disease: Secondary | ICD-10-CM | POA: Insufficient documentation

## 2021-10-30 DIAGNOSIS — Z87891 Personal history of nicotine dependence: Secondary | ICD-10-CM | POA: Insufficient documentation

## 2021-10-30 DIAGNOSIS — Z7982 Long term (current) use of aspirin: Secondary | ICD-10-CM | POA: Diagnosis not present

## 2021-10-30 DIAGNOSIS — Z951 Presence of aortocoronary bypass graft: Secondary | ICD-10-CM | POA: Insufficient documentation

## 2021-10-30 DIAGNOSIS — I129 Hypertensive chronic kidney disease with stage 1 through stage 4 chronic kidney disease, or unspecified chronic kidney disease: Secondary | ICD-10-CM | POA: Diagnosis not present

## 2021-10-30 DIAGNOSIS — Z20822 Contact with and (suspected) exposure to covid-19: Secondary | ICD-10-CM | POA: Diagnosis not present

## 2021-10-30 DIAGNOSIS — J45909 Unspecified asthma, uncomplicated: Secondary | ICD-10-CM | POA: Insufficient documentation

## 2021-10-30 DIAGNOSIS — J449 Chronic obstructive pulmonary disease, unspecified: Secondary | ICD-10-CM | POA: Insufficient documentation

## 2021-10-30 DIAGNOSIS — Z7952 Long term (current) use of systemic steroids: Secondary | ICD-10-CM | POA: Diagnosis not present

## 2021-10-30 DIAGNOSIS — N189 Chronic kidney disease, unspecified: Secondary | ICD-10-CM | POA: Diagnosis not present

## 2021-10-30 DIAGNOSIS — R059 Cough, unspecified: Secondary | ICD-10-CM | POA: Diagnosis present

## 2021-10-30 DIAGNOSIS — Z79899 Other long term (current) drug therapy: Secondary | ICD-10-CM | POA: Insufficient documentation

## 2021-10-30 DIAGNOSIS — J069 Acute upper respiratory infection, unspecified: Secondary | ICD-10-CM | POA: Diagnosis not present

## 2021-10-30 LAB — RESP PANEL BY RT-PCR (FLU A&B, COVID) ARPGX2
Influenza A by PCR: NEGATIVE
Influenza B by PCR: NEGATIVE
SARS Coronavirus 2 by RT PCR: NEGATIVE

## 2021-10-30 MED ORDER — PREDNISONE 10 MG PO TABS: 30.0000 mg | ORAL_TABLET | Freq: Every day | ORAL | 0 refills | Status: DC

## 2021-10-30 MED ORDER — AZITHROMYCIN 250 MG PO TABS
500.0000 mg | ORAL_TABLET | Freq: Once | ORAL | Status: AC
  Administered 2021-10-30: 500 mg via ORAL
  Filled 2021-10-30: qty 2

## 2021-10-30 MED ORDER — ALBUTEROL SULFATE (2.5 MG/3ML) 0.083% IN NEBU
5.0000 mg | INHALATION_SOLUTION | Freq: Once | RESPIRATORY_TRACT | Status: AC
  Administered 2021-10-30: 5 mg via RESPIRATORY_TRACT
  Filled 2021-10-30: qty 6

## 2021-10-30 MED ORDER — AZITHROMYCIN 250 MG PO TABS: 250.0000 mg | ORAL_TABLET | Freq: Every day | ORAL | 0 refills | Status: AC

## 2021-10-30 MED ORDER — PREDNISONE 50 MG PO TABS
60.0000 mg | ORAL_TABLET | Freq: Once | ORAL | Status: AC
  Administered 2021-10-30: 60 mg via ORAL
  Filled 2021-10-30: qty 1

## 2021-10-30 NOTE — ED Provider Notes (Signed)
[facility] EMERGENCY DEPARTMENT Provider Note   CSN: [id number] Arrival date & time: 10/30/21  1217     History Chief Complaint  Patient presents with   Cough    Debra [name] is a 75 y.o. female.  HPI  Patient with significant medical history including asthma, COPD, diabetes, hypertension, CKD stage III presents to the emergency department with chief complaint of URI-like symptoms.  Patient states he started about few days ago, she endorses nasal congestion, nonproductive cough, slight wheezing, shortness of breath only while coughing.  She denies chest pain, stomach pains, nausea, vomiting, diarrhea, general body aches, denies any associated fevers or chills.  She denies  recent sick contacts, is up-to-date on her COVID and her influenza vaccines.  States that she has been using her inhalers which have given her some relief, she has no other complaints at this time.    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     Family History  Problem Relation Age of Onset   Cancer Father    Stroke Mother    Heart disease Brother    Allergic rhinitis Neg Hx    Asthma Neg Hx    Eczema Neg Hx    Immunodeficiency Neg Hx    Urticaria Neg Hx    Angioedema Neg Hx    Atopy Neg Hx      Social History   Tobacco Use   Smoking status: Former    Types: Cigarettes    Quit date: 05/19/2012    Years since quitting: 9.4   Smokeless tobacco: Never  Vaping Use   Vaping Use: Never used  Substance Use Topics   Alcohol use: No    Alcohol/week: 0.0 standard drinks   Drug use: No    Home Medications Prior to Admission medications   Medication Sig Start Date End Date Taking? Authorizing Provider  azithromycin (ZITHROMAX) 250 MG tablet Take 1 tablet (250 mg total) by mouth daily for 4 days. Take  1 every day until finished. 10/30/21 11/03/21 Yes Marcello Fennel, PA-C  fluticasone (FLOVENT HFA) 110 MCG/ACT inhaler Inhale 2 puffs into the lungs 2 (two) times daily. Take in addition to your Breztri during respiratory illness for at least one week or until symptoms resolve. 10/29/21   Sigurd Sos, MD  predniSONE (DELTASONE) 10 MG tablet Take 3 tablets (30 mg total) by mouth daily. 10/30/21  Yes Marcello Fennel, PA-C  albuterol [facility] HFA) 108 [phone] Base) MCG/ACT inhaler Inhale 2 puffs into the lungs every 4 (four) hours as needed for wheezing or shortness of breath. 08/14/21   Valentina Shaggy, MD  amLODipine (NORVASC) 5 MG tablet Take 5 mg by mouth daily.    [provider]  aspirin 81 MG tablet Take 81 mg by mouth daily.    [provider]  atorvastatin (LIPITOR) 80 MG tablet Take 80 mg by mouth daily.    [provider]  Benralizumab [employer] [facility]) 30 MG/ML SOSY Inject into the skin.    [provider]  Budeson-Glycopyrrol-Formoterol (BREZTRI AEROSPHERE) 160-9-4.8 MCG/ACT AERO Inhale 2 puffs into the lungs 2 (two) times daily.    [provider]  calcium citrate-vitamin D (CITRACAL+D) 315-200 MG-UNIT tablet Take 1 tablet by mouth 2 (two) times daily.    [provider]  citalopram (CELEXA) 20 MG tablet Take 20 mg by mouth daily. 10/06/21   [provider]  co-enzyme Q-10 30 MG capsule Take 30 mg by mouth 3  (three) times daily.    [provider]  Diclofenac Sodium (VOLTAREN OP) Apply to eye.    [provider]  fexofenadine (ALLEGRA) 180 MG tablet Take 1 tablet (180 mg total) by mouth 2 (two) times daily as needed for allergies or rhinitis. 08/14/21   Valentina Shaggy, MD  fluticasone [facility]) 50 MCG/ACT nasal spray Place into both nostrils daily.    [provider]  furosemide (LASIX) 20 MG tablet Take 20 mg by mouth daily as needed. 07/12/21   [provider]  gabapentin (NEURONTIN) 400 MG capsule Take 400 mg by mouth 3 (three) times daily. 07/22/21   [provider]  hydrochlorothiazide (HYDRODIURIL) 12.5 MG tablet Take 12.5 mg by mouth daily. 07/24/19   [provider]  ipratropium-albuterol (DUONEB) 0.5-2.5 (3) MG/3ML SOLN USE 3ML IN NEB EVERY 4 HOURS AS NEEDED 06/20/18   [provider]  lisinopril (PRINIVIL,ZESTRIL) 10 MG tablet Take 10 mg by mouth daily.    [provider]  metoprolol (LOPRESSOR) 100 MG tablet  03/13/17   [provider]  montelukast (SINGULAIR) 10 MG tablet Take 1 tablet (10 mg total) by mouth daily. 08/14/21   Valentina Shaggy, MD  nystatin (MYCOSTATIN) 100000 UNIT/ML suspension PLEASE SEE ATTACHED FOR DETAILED DIRECTIONS 07/22/21   [provider]  olmesartan (BENICAR) 20 MG tablet Take 1 tablet (20 mg total) by mouth daily. 06/11/21   Tanda Rockers, MD  predniSONE (DELTASONE) 10 MG tablet Take 10 mg by mouth as needed. 03/11/21   [provider]  Triamcinolone Acetonide 0.025 % LOTN Apply topically.    [provider]  VENTOLIN HFA 108 (90 Base) MCG/ACT inhaler INHALE 2 PUFFS INTO THE LUNGS EVERY 4 (FOUR) HOURS AS NEEDED FOR WHEEZING OR SHORTNESS OF BREATH (COUGH). 08/14/21   Valentina Shaggy, MD  vitamin C (ASCORBIC ACID) 500 MG tablet Take 500 mg by mouth daily.    [provider]  Vitamin D, Ergocalciferol, (DRISDOL) 1.25 MG (50000 UT) CAPS capsule  TAKE 1 CAPSULE BY MOUTH MONTHLY 05/03/19   [provider]    Allergies    Aspirin  Review of Systems   Review of Systems  Constitutional:  Negative for chills and fever.  HENT:  Positive for congestion. Negative for sore throat.   Respiratory:  Positive for cough and shortness of breath.   Cardiovascular:  Negative for chest pain.  Gastrointestinal:  Negative for abdominal pain, diarrhea, nausea and vomiting.  Genitourinary:  Negative for enuresis.  Musculoskeletal:  Negative for back pain and myalgias.  Skin:  Negative for rash.  Neurological:  Negative for dizziness.  Hematological:  Does not bruise/bleed easily.   Physical Exam Updated Vital Signs BP (!) 157/75   Pulse 74  Temp 99.7 F (37.6 C) (Oral)   Resp 17   Wt 85.4 kg   SpO2 99%   BMI 34.44 kg/m   Physical Exam Vitals and nursing note reviewed.  Constitutional:      General: She is not in acute distress.    Appearance: She is not ill-appearing.  HENT:     Head: Normocephalic and atraumatic.     Nose: Congestion present.     Mouth/Throat:     Mouth: Mucous membranes are moist.     Pharynx: Oropharynx is clear.  Eyes:     Conjunctiva/sclera: Conjunctivae normal.  Cardiovascular:     Rate and Rhythm: Normal rate and regular rhythm.     Pulses: Normal pulses.     Heart sounds: No murmur heard.   No friction rub. No gallop.  Pulmonary:     Effort: No respiratory distress.     Breath sounds: Wheezing and rales present. No rhonchi.     Comments: Patient shows no signs of respiratory distress, she is nontachypneic, not hypoxic, able to speak in full senses.  She does have noted wheezing and rales heard bilaterally. Abdominal:     Palpations: Abdomen is soft.     Tenderness: There is no abdominal tenderness. There is no right CVA tenderness or left CVA tenderness.  Musculoskeletal:     Right lower leg: No edema.     Left lower leg: No edema.  Skin:    General: Skin is warm and dry.  Neurological:      Mental Status: She is alert.  Psychiatric:        Mood and Affect: Mood normal.    ED Results / Procedures / Treatments   Labs (all labs ordered are listed, but only abnormal results are displayed) Labs Reviewed  RESP PANEL BY RT-PCR (FLU A&B, COVID) [id number]    EKG EKG Interpretation  Date/Time:  Friday October 30 2021 14:14:05 EST Ventricular Rate:  61 PR Interval:  174 QRS Duration: 145 QT Interval:  465 QTC Calculation: 469 R Axis:   -48 Text Interpretation: Sinus rhythm Probable left atrial enlargement RBBB and LAFB No old tracing to compare Confirmed by Davonna Belling [phone]) on 10/30/2021 2:44:27 PM  Radiology DG Chest Port 1 View  Result Date: 10/30/2021 CLINICAL DATA:  Cough, congestion, and shortness of breath. EXAM: PORTABLE CHEST 1 VIEW COMPARISON:  Chest x-ray dated December 19, 2017. FINDINGS: Stable cardiomegaly status post CABG. Unchanged pulmonary vascular congestion. No focal consolidation, pleural effusion, or pneumothorax. No acute osseous abnormality. IMPRESSION: 1. Chronic cardiomegaly and pulmonary vascular congestion without overt edema. Electronically Signed   By: Titus Dubin M.D.   On: 10/30/2021 14:08    Procedures Procedures   Medications Ordered in ED Medications  azithromycin (ZITHROMAX) tablet 500 mg (has no administration in time range)  predniSONE (DELTASONE) tablet 60 mg (has no administration in time range)  albuterol (PROVENTIL) (2.5 MG/3ML) 0.083% nebulizer solution 5 mg (5 mg Nebulization Given 10/30/21 1411)    ED Course  I have reviewed the triage vital signs and the nursing notes.  Pertinent labs & imaging results that were available during my care of the patient were reviewed by me and considered in my medical decision making (see chart for details).    MDM Rules/Calculators/A&P                          Initial impression-presents with URI-like symptoms.  She is alert, does not appear acute stress,  vital signs are  reassuring.  Will obtain respiratory panel, chest x-ray, EKG provide patient with albuterol and reassess.  Work-up-respiratory panel negative, chest x-ray unremarkable, EKG sinus without signs of ischemia.  Reassessment-reassessed after nebulizer treatment states she is feeling much better, lung sounds were reassessed still has slight wheezing heard my exam but improved from prior exam.  Patient agreed for discharge at this time.   Rule out- I have low suspicion for ACS as history is atypical, EKG without signs of ischemia.  Low suspicion for PE as patient denies pleuritic chest pain, shortness of breath, patient denies leg pain, no pedal edema noted on exam, vital signs reassuring nontachypneic, nonhypoxic.  Low suspicion for systemic infection as patient is nontoxic-appearing, vital signs reassuring, no obvious source infection noted on exam.  I have low suspicion patient may be hospitalized due to COPD exacerbation as she has no new oxygen requirements, vital signs are reassuring, she is improved after nebulizing treatment.  Low suspicion for pneumonia as chest x-ray unremarkable.   Plan-  URI-likely patient setting from a viral infection due to her history of COPD and asthma will start her on antibiotics, starting short course of steroids, follow-up with PCP for further evaluation.  Gave strict return precautions.  Vital signs have remained stable, no indication for hospital admission.  Patient discussed with attending and they agreed with assessment and plan.  Patient given at home care as well strict return precautions.  Patient verbalized that they understood agreed to said plan.  Final Clinical Impression(s) / ED Diagnoses Final diagnoses:  Viral URI with cough    Rx / DC Orders ED Discharge Orders          Ordered    predniSONE (DELTASONE) 10 MG tablet  Daily        10/30/21 1502    azithromycin (ZITHROMAX) 250 MG tablet  Daily        10/30/21 1502             Aron Baba 10/30/21 1502    Davonna Belling, MD 10/31/21 1027

## 2021-10-30 NOTE — Discharge Instructions (Signed)
Likely have a viral infection, I have started you on antibiotics as well as steroids please take as prescribed.  You have gotten your first dose of your steroids as well as antibiotics please do not start this until tomorrow.  Please continue with your inhalers as needed.  Please follow-up your PCP for further evaluation.  Come back to the emergency department if you develop chest pain, shortness of breath, severe abdominal pain, uncontrolled nausea, vomiting, diarrhea.

## 2021-10-30 NOTE — ED Triage Notes (Signed)
Productive cough with nasal congestion x 2 days

## 2021-11-04 ENCOUNTER — Ambulatory Visit: Payer: Medicare Other | Admitting: Family

## 2021-11-04 ENCOUNTER — Ambulatory Visit (INDEPENDENT_AMBULATORY_CARE_PROVIDER_SITE_OTHER): Payer: Medicare Other

## 2021-11-04 ENCOUNTER — Encounter: Payer: Self-pay | Admitting: Family

## 2021-11-04 ENCOUNTER — Other Ambulatory Visit: Payer: Self-pay

## 2021-11-04 VITALS — BP 150/80 | HR 80 | Temp 97.3°F | Resp 18 | Ht 62.0 in | Wt 183.0 lb

## 2021-11-04 DIAGNOSIS — J3089 Other allergic rhinitis: Secondary | ICD-10-CM

## 2021-11-04 DIAGNOSIS — J4550 Severe persistent asthma, uncomplicated: Secondary | ICD-10-CM

## 2021-11-04 DIAGNOSIS — J449 Chronic obstructive pulmonary disease, unspecified: Secondary | ICD-10-CM

## 2021-11-04 MED ORDER — METHYLPREDNISOLONE ACETATE 40 MG/ML IJ SUSP
40.0000 mg | Freq: Once | INTRAMUSCULAR | Status: AC
Start: 2021-11-04 — End: 2021-11-04
  Administered 2021-11-04: 40 mg via INTRAMUSCULAR

## 2021-11-04 NOTE — Patient Instructions (Addendum)
1. Asthma-COPD overlap syndrome - Lung testing not done today due to symptoms -Depo medrol 40 mg given IM today in office - Daily controller medication(s): Fasenra 30mg  every 8 weeks and Breztri two puffs twice daily with spacer - Prior to physical activity: ProAir 2 puffs 10-15 minutes before physical activity. - Rescue medications: ProAir 4 puffs every 4-6 hours as needed - Asthma control goals:  * Full participation in all desired activities (may need albuterol before activity) * Albuterol use two time or less a week on average (not counting use with activity) * Cough interfering with sleep two time or less a month * Oral steroids no more than once a year * No hospitalizations  2. Perennial allergic rhinitis - Continue with Allegra one tablet 1-2 times daily as needed. - Continue with Flonase one spray per nostril daily.  - Continue with nasal saline rinses as needed.  Please let us know if this treatment plan is not working well for you or if you are not getting any better or develop a fever. Schedule a follow up appointment in 1 week or sooner

## 2021-11-04 NOTE — Progress Notes (Signed)
[place], [place] [id number] Dept: [phone]  FOLLOW UP NOTE  Patient ID: Debra [name], female    DOB: Oct 18, 1946  Age: 75 y.o. MRN: [id number] Date of Office Visit: 11/04/2021  Assessment  Chief Complaint: Cough (Patient has been sick since 10/30/21 she went to the ED and was placed on antibiotics but she has seen no relief. She states her cough is worse and now her chest hurts from coughing. She gets thick grey mucus up when she coughs and she also has green mucus coming from nose when she blows.)  HPI Debra [name] is a 75 year old female who presents today for an acute visit.  She was last seen on August 14, 2021 by Dr. Ernst Bowler for asthma COPD overlap syndrome, perennial allergic rhinitis, previous smoker (30+ years).  Recurrent infections-antibiotics 3-4 times per year (sinusitis mostly), RA on chronic prednisone.  Asthma-COPD overlap syndrome is reported as not well controlled with Fasenra 30 mg every 8 weeks, Breztri 2 puffs twice a day with spacer, ProAir as needed, and albuterol via nebulizer as needed.  She reports on Wednesday November 9th she began having a scratchy throat and on Friday, November 11 she went to [facility] where she was diagnosed with viral URI with cough.  She was given azithromycin which she finished yesterday and prednisone which she finished this morning.  She mentions while she was at [facility] she was tested for COVID-19 and influenza and they were negative.  She reports that she does not feel better.  If anything she feels worse.  She reports a productive cough with thick gray sputum, wheezing, some tightness in her chest and a lot of shortness of breath.  She denies fever, chills, body aches, sick contacts ,and nocturnal awakenings due to breathing problems.  She uses her albuterol a couple times a day depending on activity.  The day before she was using her nebulizer every 4 hours.  She has also tried taking Robitussin and  Mucinex.  She did have a chest x-ray while at the hospital that shows: "1. Chronic cardiomegaly and pulmonary vascular congestion without overt edema."  She reports that she sees cardiology.    Perennial allergic rhinitis is reported as not well controlled with Allegra 1 tablet twice a day and fluticasone 1 spray each nostril once a day.  She reports a little bit of green rhinorrhea for the past few days, nasal congestion, and some postnasal drip.  She is not sure of the color of her postnasal drip.    Drug Allergies:  Allergies  Allergen Reactions   Aspirin Other (See Comments)    GI upset and burns per patient.     Review of Systems: Review of Systems  Constitutional:  Negative for chills and fever.  HENT:         Reports green rhinorrhea for the past few days, nasal congestion, and post nasal drip. She is unsure of the color of her post nasal drip  Eyes:        Denies itchy watery eyes  Respiratory:  Positive for cough, shortness of breath and wheezing.        Reports productive cough with thick gray sputum, wheezing, some tightness in her chest, and a lot of shortness of breath.  Cardiovascular:  Negative for chest pain and palpitations.  Gastrointestinal:        Denies heartburn or reflux symptoms  Genitourinary:  Negative for frequency.  Musculoskeletal:  Negative for myalgias.  Skin:  Negative for itching and rash.  Neurological:  Positive for headaches.  Endo/Heme/Allergies:  Positive for environmental allergies.    Physical Exam: BP (!) 150/80   Pulse 80   Temp (!) 97.3 F (36.3 C) (Temporal)   Resp 18   Ht 5\' 2"  (1.575 m)   Wt 183 lb (83 kg)   SpO2 93%   BMI 33.47 kg/m    Physical Exam Constitutional:      Appearance: Normal appearance.  HENT:     Head: Normocephalic and atraumatic.     Comments: Pharynx normal, eyes normal, ears normal, nose: Bilateral lower turbinates mildly edematous and slightly erythematous with no drainage noted    Right Ear:  Tympanic membrane, ear canal and external ear normal.     Left Ear: Tympanic membrane, ear canal and external ear normal.     Mouth/Throat:     Mouth: Mucous membranes are moist.     Pharynx: Oropharynx is clear.  Eyes:     Conjunctiva/sclera: Conjunctivae normal.  Cardiovascular:     Rate and Rhythm: Regular rhythm.     Heart sounds: Normal heart sounds.  Pulmonary:     Effort: Pulmonary effort is normal.     Comments: Rhonchi heard throughout all lung fields Musculoskeletal:     Cervical back: Neck supple.  Skin:    General: Skin is warm.  Neurological:     Mental Status: She is alert and oriented to person, place, and time.  Psychiatric:        Mood and Affect: Mood normal.        Behavior: Behavior normal.        Thought Content: Thought content normal.        Judgment: Judgment normal.    Diagnostics: Not done today due to symptoms  Assessment and Plan: 1. Asthma-COPD overlap syndrome ([place])   2. Perennial allergic rhinitis     No orders of the defined types were placed in this encounter.   Patient Instructions  1. Asthma-COPD overlap syndrome - Lung testing not done today due to symptoms -Depo medrol 40 mg given IM today in office - Daily controller medication(s): Fasenra 30mg  every 8 weeks and Breztri two puffs twice daily with spacer - Prior to physical activity: ProAir 2 puffs 10-15 minutes before physical activity. - Rescue medications: ProAir 4 puffs every 4-6 hours as needed - Asthma control goals:  * Full participation in all desired activities (may need albuterol before activity) * Albuterol use two time or less a week on average (not counting use with activity) * Cough interfering with sleep two time or less a month * Oral steroids no more than once a year * No hospitalizations  2. Perennial allergic rhinitis - Continue with Allegra one tablet 1-2 times daily as needed. - Continue with Flonase one spray per nostril daily.  - Continue with nasal  saline rinses as needed.  Please let us know if this treatment plan is not working well for you or if you are not getting any better or develop a fever. Schedule a follow up appointment in 1 week or sooner  Return in about 1 week (around 11/11/2021), or if symptoms worsen or fail to improve.    Thank you for the opportunity to care for this patient.  Please do not hesitate to contact me with questions.  Althea Charon, FNP [facility]

## 2021-11-04 NOTE — Addendum Note (Signed)
Addended by: Reggy Eye on: 11/04/2021 04:46 PM   Modules accepted: Orders

## 2021-11-10 ENCOUNTER — Telehealth: Payer: Self-pay

## 2021-11-10 DIAGNOSIS — J449 Chronic obstructive pulmonary disease, unspecified: Secondary | ICD-10-CM

## 2021-11-10 NOTE — Telephone Encounter (Signed)
Patient reports that she took [place] and Mucinex DM earlier and began feeling somewhat better however, she is still reporting congestion in her chest and head.  She reports a cough that is producing a milky white substance and sounds rattly.  She is reporting intermittent sweats that began earlier today.  She denies fever.  She does have an appointment at the urgent care at [place].  She agrees to call the [place] office and report her progress tomorrow morning.  If she still needs attention tomorrow morning I plan to repeat a chest x-ray and a stat BNP.  She will call the clinic with any questions or worsening symptoms

## 2021-11-10 NOTE — Telephone Encounter (Signed)
Patient called stating she is still feeling bad even after her visit on 11/04/2021 with Dr Ernst Bowler. Her cough has gotten a little better but it is still deep. She keeps having sweats when she lays down and feeling a little shaky.   Patient wants to know what else she needs to do?  I did let her know Dr Ernst Bowler is out of the office but I will send her message to another provider.

## 2021-11-10 NOTE — Telephone Encounter (Signed)
Webb Silversmith requested orders verbally per a note she received from patient.

## 2021-11-11 NOTE — Telephone Encounter (Signed)
Patient called back to let Webb Silversmith know she was seen at the [facility] and they gave her a steroid shot, prednisone and a cough medicine that she didn't know the name of.

## 2021-11-11 NOTE — Telephone Encounter (Signed)
Thank you. Please have her call the clinic if she has any further symptoms.

## 2021-11-11 NOTE — Telephone Encounter (Signed)
Debra [name]:  Patient called back to let Webb Silversmith know she was seen at the [facility] and they gave her a steroid shot, prednisone and a cough medicine that she didn't know the name of.

## 2021-11-16 ENCOUNTER — Telehealth: Payer: Self-pay | Admitting: *Deleted

## 2021-11-16 ENCOUNTER — Other Ambulatory Visit: Payer: Self-pay | Admitting: *Deleted

## 2021-11-16 NOTE — Telephone Encounter (Signed)
Great that she is feeling better. Please do not fill the duoneb at this time she is already using Breztri. She can continue to use albuterol once every 4 hours as needed for cough or wheeze. She can use albuterol about 20 minutes before using Brezrti for better inhaler deposition if needed. Thank you

## 2021-11-16 NOTE — Telephone Encounter (Signed)
Patient called back and stated that she is doing much better, she states that when she wakes up in the morning her head does feel a little congested like a balloon but it does subside as she goes on with her day. She does state that her breathing is pretty good and it only become labored when she does a lot of exertion and lifts heavy objects. She states that her breathing is good at night when sleeping and just has a slight cough from time to time in general. She was requesting a refill be sent to [facility] in [place] for Duoneb but I saw that it was sent in by a historic provider and I wanted to check with you first to see if it was ok to send in.

## 2021-11-16 NOTE — Telephone Encounter (Signed)
Called both numbers provided and left a voicemail asking for patient to return call to see how she is doing.

## 2021-11-16 NOTE — Telephone Encounter (Signed)
Called and left a voicemail asking for patient to return call to discuss.  °

## 2021-11-16 NOTE — Telephone Encounter (Signed)
-----   Message from Dara Hoyer, FNP sent at 11/16/2021  9:12 AM EST ----- Can you please call this patient and check on her breathing? Thank you

## 2021-11-17 NOTE — Telephone Encounter (Signed)
Called patient to go over previous note and left a message for the patient to call back.

## 2021-11-27 ENCOUNTER — Other Ambulatory Visit: Payer: Self-pay | Admitting: Allergy & Immunology

## 2021-12-30 ENCOUNTER — Other Ambulatory Visit: Payer: Self-pay

## 2021-12-30 ENCOUNTER — Ambulatory Visit (INDEPENDENT_AMBULATORY_CARE_PROVIDER_SITE_OTHER): Payer: Medicare PPO

## 2021-12-30 DIAGNOSIS — J4550 Severe persistent asthma, uncomplicated: Secondary | ICD-10-CM | POA: Diagnosis not present

## 2022-01-15 ENCOUNTER — Other Ambulatory Visit: Payer: Self-pay | Admitting: *Deleted

## 2022-02-16 ENCOUNTER — Encounter (HOSPITAL_COMMUNITY): Payer: Self-pay | Admitting: Radiology

## 2022-02-17 ENCOUNTER — Encounter: Payer: Self-pay | Admitting: Allergy & Immunology

## 2022-02-17 ENCOUNTER — Ambulatory Visit (INDEPENDENT_AMBULATORY_CARE_PROVIDER_SITE_OTHER): Payer: Medicare PPO | Admitting: Allergy & Immunology

## 2022-02-17 ENCOUNTER — Other Ambulatory Visit: Payer: Self-pay

## 2022-02-17 VITALS — BP 142/76 | HR 57 | Temp 97.2°F | Resp 18 | Ht 62.0 in | Wt 176.6 lb

## 2022-02-17 DIAGNOSIS — J3089 Other allergic rhinitis: Secondary | ICD-10-CM

## 2022-02-17 DIAGNOSIS — B999 Unspecified infectious disease: Secondary | ICD-10-CM

## 2022-02-17 DIAGNOSIS — J449 Chronic obstructive pulmonary disease, unspecified: Secondary | ICD-10-CM | POA: Diagnosis not present

## 2022-02-17 MED ORDER — VENTOLIN HFA 108 (90 BASE) MCG/ACT IN AERS: 2.0000 | INHALATION_SPRAY | RESPIRATORY_TRACT | 1 refills | Status: DC | PRN

## 2022-02-17 MED ORDER — BREZTRI AEROSPHERE 160-9-4.8 MCG/ACT IN AERO: 2.0000 | INHALATION_SPRAY | Freq: Two times a day (BID) | RESPIRATORY_TRACT | 5 refills | Status: DC

## 2022-02-17 NOTE — Progress Notes (Signed)
? ?FOLLOW UP ? ?Date of Service/Encounter:  02/17/22 ? ? ?Assessment:  ? 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 Melvyn Novas) ?  ?Perennial allergic rhinitis ?  ?Previous smoker (30+ years) ?  ?Recurrent infections - antibiotics 3-4 times per year (sinusitis mostly) but with excellent response to Pnuemovax ?  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  ? ?Plan/Recommendations:  ? ?1. Asthma-COPD overlap syndrome ?- Lung testing looks stable today. ?- we are not going to make any changes today.  ?- Call the [place] [facility] number and we can help with redoing your paperwork if needed.  ?- Daily controller medication(s): Fasenra 30mg  every 8 weeks and Breztri two puffs twice daily with spacer ?- Prior to physical activity: ProAir 2 puffs 10-15 minutes before physical activity. ?- Rescue medications: ProAir 4 puffs every 4-6 hours as needed ?- Asthma control goals:  ?* Full participation in all desired activities (may need albuterol before activity) ?* Albuterol use two time or less a week on average (not counting use with activity) ?* Cough interfering with sleep two time or less a month ?* Oral steroids no more than once a year ?* No hospitalizations ? ?2. Perennial allergic rhinitis ?- Continue with Allegra one tablet daily.  ?- INCREASE Allegra to two tablets daily when your symptoms are particularly bad.  ?- Continue with Flonase one spray per nostril daily.  ?- Continue with nasal saline rinses as needed. ? ?3. Return in about 6 months (around 08/20/2022).  ? ? ?Subjective:  ? ?Debra [name] is a 76 y.o. female presenting today for follow up of  ?Chief Complaint  ?Patient presents with  ? Asthma  ?  No issues   ? Cough  ?  Deep cough   ? Allergic Rhinitis   ?  Stuffy nose due to pollen   ? ? ?Debra [name] has a history of the following: ?Patient Active Problem List  ? Diagnosis Date Noted  ? Essential hypertension 06/11/2021  ? Asthma-COPD overlap syndrome ([place]) 09/28/2016  ?  Perennial allergic rhinitis 09/28/2016  ? ? ?History obtained from: chart review and patient. ? ?Debra [name] is a 76 y.o. female presenting for a follow up visit.  She was last seen in November 2022.  At that time, we gave her a Depo-Medrol.  We continue with Berna Bue every 8 weeks and Breztri 2 puffs twice daily.  For her rhinitis, we will continue with Allegra as well as Flonase. ? ?Since last visit, she has done well. She has changed to [facility] and this covers [place] providers.  ? ?Asthma/Respiratory Symptom History: She remains on the Breztri two puffs twice daily. She reports that she was changed to something else. She contacted a friend with Social Services who gave her some resources to help with programs to help with medications. She contacted [employer] and she was supplied with [place] [facility]. She is down to her last one and she has not gotten a refill yet. She is going to call the number to see what she needs to do.  This is all managed by Dr. Koleen Nimrod her Pulmonologist.  ? ?Of note, she did have a steroid injection at the end of February. Then she has the steroid injection in December as well. She has been getting more steroids more than before.  ? ?Allergic Rhinitis Symptom History: She remains on the Allegra once daily. She is also on the Flonase. She has not tried increasing it with the trees. This  is normally a bad time of the year for her. She has never been on allergy shots in the past. She has not tried doubling up on her Allegra to see if this helps at all. ? ?Otherwise, there have been no changes to her past medical history, surgical history, family history, or social history. ? ? ? ?Review of Systems  ?Constitutional: Negative.  Negative for chills, fever, malaise/fatigue and weight loss.  ?HENT: Negative.  Negative for congestion, ear discharge, ear pain, sinus pain and sore throat.   ?     Positive for rhinorrhea with postnasal drip. Positive for throat clearing.   ?Eyes:  Negative for pain, discharge and  redness.  ?Respiratory:  Positive for cough. Negative for sputum production, shortness of breath and wheezing.   ?Cardiovascular: Negative.  Negative for chest pain and palpitations.  ?Gastrointestinal:  Negative for abdominal pain, constipation, diarrhea, heartburn, nausea and vomiting.  ?Skin: Negative.  Negative for itching and rash.  ?Neurological:  Negative for dizziness and headaches.  ?Endo/Heme/Allergies:  Positive for environmental allergies. Does not bruise/bleed easily.   ? ? ? ?Objective:  ? ?Blood pressure (!) 142/76, pulse (!) 57, temperature (!) 97.2 ?F (36.2 ?C), resp. rate 18, height 5\' 2"  (1.575 m), weight 176 lb 9.6 oz (80.1 kg), SpO2 97 %. ?Body mass index is 32.3 kg/m?. ? ? ? ?Physical Exam ?Vitals reviewed.  ?Constitutional:   ?   Appearance: She is well-developed.  ?   Comments: Pleasant female.  Very talkative as always  ?HENT:  ?   Head: Normocephalic and atraumatic.  ?   Right Ear: Tympanic membrane, ear canal and external ear normal.  ?   Left Ear: Tympanic membrane, ear canal and external ear normal.  ?   Nose: No nasal deformity, septal deviation, mucosal edema or rhinorrhea.  ?   Right Turbinates: Enlarged, swollen and pale.  ?   Left Turbinates: Enlarged, swollen and pale.  ?   Right Sinus: No maxillary sinus tenderness or frontal sinus tenderness.  ?   Left Sinus: No maxillary sinus tenderness or frontal sinus tenderness.  ?   Mouth/Throat:  ?   Mouth: Mucous membranes are not pale and not dry.  ?   Pharynx: Uvula midline.  ?Eyes:  ?   General: Lids are normal. No allergic shiner.    ?   Right eye: No discharge.     ?   Left eye: No discharge.  ?   Conjunctiva/sclera: Conjunctivae normal.  ?   Right eye: Right conjunctiva is not injected. No chemosis. ?   Left eye: Left conjunctiva is not injected. No chemosis. ?   Pupils: Pupils are equal, round, and reactive to light.  ?Cardiovascular:  ?   Rate and Rhythm: Normal rate and regular rhythm.  ?   Heart sounds: Normal heart sounds.   ?Pulmonary:  ?   Effort: Pulmonary effort is normal. No tachypnea, accessory muscle usage or respiratory distress.  ?   Breath sounds: Normal breath sounds. No wheezing, rhonchi or rales.  ?   Comments: Moving air well in all lung fields. No increased work of breathing noted.  ?Chest:  ?   Chest wall: No tenderness.  ?Lymphadenopathy:  ?   Cervical: No cervical adenopathy.  ?Skin: ?   General: Skin is warm.  ?   Capillary Refill: Capillary refill takes less than 2 seconds.  ?   Coloration: Skin is not pale.  ?   Findings: No abrasion, erythema, petechiae or rash. Rash  is not papular, urticarial or vesicular.  ?Neurological:  ?   Mental Status: She is alert.  ?Psychiatric:     ?   Behavior: Behavior is cooperative.  ?  ? ?Diagnostic studies:   ? ?Spirometry: results abnormal (FEV1: 0.73/43%, FVC: 0.93/43%, FEV1/FVC: 78%).  ?  ?Spirometry consistent with possible restrictive disease. This is slightly better than her baseline.  ? ?Allergy Studies: none ? ? ? ? ? ? ?  ?Salvatore Marvel, MD  ?[facility] ? ? ? ? ? ? ?

## 2022-02-17 NOTE — Patient Instructions (Addendum)
1. Asthma-COPD overlap syndrome ?- Lung testing looks stable today. ?- we are not going to make any changes today.  ?- Call the [place] and Me number and we can help with redoing your paperwork if needed.  ?- Daily controller medication(s): Fasenra 30mg  every 8 weeks and Breztri two puffs twice daily with spacer ?- Prior to physical activity: ProAir 2 puffs 10-15 minutes before physical activity. ?- Rescue medications: ProAir 4 puffs every 4-6 hours as needed ?- Asthma control goals:  ?* Full participation in all desired activities (may need albuterol before activity) ?* Albuterol use two time or less a week on average (not counting use with activity) ?* Cough interfering with sleep two time or less a month ?* Oral steroids no more than once a year ?* No hospitalizations ? ?2. Perennial allergic rhinitis ?- Continue with Allegra one tablet daily.  ?- INCREASE Allegra to two tablets daily when your symptoms are particularly bad.  ?- Continue with Flonase one spray per nostril daily.  ?- Continue with nasal saline rinses as needed. ? ?3. Return in about 6 months (around 08/20/2022).  ? ? ?Please inform us of any Emergency Department visits, hospitalizations, or changes in symptoms. Call us before going to the ED for breathing or allergy symptoms since we might be able to fit you in for a sick visit. Feel free to contact us anytime with any questions, problems, or concerns. ? ?It was a pleasure to see you again today! ? ?Websites that have reliable patient information: ?1. American Academy of Asthma, Allergy, and Immunology: www.aaaai.org ?2. Food Allergy Research and Education (FARE): foodallergy.org ?3. Mothers of Asthmatics: http://www.asthmacommunitynetwork.org ?4. [employer] of Allergy, Asthma, and Immunology: MonthlyElectricBill.co.uk ? ? ?COVID-19 Vaccine Information can be found at: ShippingScam.co.uk For questions related to vaccine distribution or appointments,  please email vaccine@[email or web address] [email or web address] or call [phone].  ? ?We realize that you might be concerned about having an allergic reaction to the COVID19 vaccines. To help with that concern, WE ARE OFFERING THE COVID19 VACCINES IN OUR OFFICE! Ask the front desk for dates!  ? ? ? ??Like? [place] on Facebook and Instagram for our latest updates!  ?  ? ? ?A healthy democracy works best when [employer] participate! Make sure you are registered to vote! If you have moved or changed any of your contact information, you will need to get this updated before voting! ? ?In some cases, you MAY be able to register to vote online: [email or web address] ? ? ? ? ? ? ? ? ? ?

## 2022-02-24 ENCOUNTER — Other Ambulatory Visit: Payer: Self-pay

## 2022-02-24 ENCOUNTER — Ambulatory Visit (INDEPENDENT_AMBULATORY_CARE_PROVIDER_SITE_OTHER): Payer: Medicare PPO

## 2022-02-24 DIAGNOSIS — J4550 Severe persistent asthma, uncomplicated: Secondary | ICD-10-CM

## 2022-03-08 ENCOUNTER — Telehealth: Payer: Medicare PPO | Admitting: Emergency Medicine

## 2022-03-08 DIAGNOSIS — N83201 Unspecified ovarian cyst, right side: Secondary | ICD-10-CM

## 2022-03-08 DIAGNOSIS — N83209 Unspecified ovarian cyst, unspecified side: Secondary | ICD-10-CM | POA: Insufficient documentation

## 2022-03-08 NOTE — Progress Notes (Signed)
?Virtual Visit Consent  ? ?[name], you are scheduled for a virtual visit with a [facility] provider today.   ?  ?Just as with appointments in the office, your consent must be obtained to participate.  Your consent will be active for this visit and any virtual visit you may have with one of our providers in the next 365 days.   ?  ?If you have a MyChart account, a copy of this consent can be sent to you electronically.  All virtual visits are billed to your insurance company just like a traditional visit in the office.   ? ?As this is a virtual visit, video technology does not allow for your provider to perform a traditional examination.  This may limit your provider's ability to fully assess your condition.  If your provider identifies any concerns that need to be evaluated in person or the need to arrange testing (such as labs, EKG, etc.), we will make arrangements to do so.   ?  ?Although advances in technology are sophisticated, we cannot ensure that it will always work on either your end or our end.  If the connection with a video visit is poor, the visit may have to be switched to a telephone visit.  With either a video or telephone visit, we are not always able to ensure that we have a secure connection.    ? ?I need to obtain your verbal consent now.   Are you willing to proceed with your visit today?  ?  ?Debra [name] has provided verbal consent on 03/08/2022 for a virtual visit (video or telephone). ?  ?Carvel Getting, NP  ? ?Date: 03/08/2022 11:25 AM ? ? ?Virtual Visit via Video Note  ? ?Debra [name], connected with  Nikiyah Fackler  ([id number], 1946-07-02) on 03/08/22 at 10:00 AM EDT by a video-enabled telemedicine application and verified that I am speaking with the correct person using two identifiers. Interactive audio and video communications were attempted, although failed due to patient's inability to connect to video. Continued visit with audio only interaction with patient  agreement. ? ? ?Location: ?Patient: Virtual Visit Location Patient: Home ?Provider: Virtual Visit Location Provider: Home Office ?  ?I discussed the limitations of evaluation and management by telemedicine and the availability of in person appointments. The patient expressed understanding and agreed to proceed.   ? ?History of Present Illness: ?Debra [name] is a 77 y.o. who identifies as a female who was assigned female at birth, and is being seen today for f/u abnormal imaging results.  ? ?Last August, pt had pelvic US ordered by nephrologist Dr. Ulice Bold that showed Cyst within RIGHT ovary containing a few scattered internal echoes, 4.7 cm greatest size, and f/u US in 3-6 months was recommended. I explained results and reasoning for f/u imaging.  ? ? ?Pt does have PCP and is seen at [facility] in [place], but is currently in between providers. Has appt with new provider there in May with Suzzanne Cloud.  ? ?She denies lower abd symptoms.  ? ?HPI: HPI  ?Problems:  ?Patient Active Problem List  ? Diagnosis Date Noted  ? Ovarian cyst 03/08/2022  ? Essential hypertension 06/11/2021  ? Asthma-COPD overlap syndrome ([place]) 09/28/2016  ? Perennial allergic rhinitis 09/28/2016  ?  ?Allergies:  ?Allergies  ?Allergen Reactions  ? Aspirin Other (See Comments)  ?  GI upset and burns per patient.   ? ?Medications:  ?Current Outpatient Medications:  ?  amLODipine (NORVASC)  5 MG tablet, Take 5 mg by mouth daily., Disp: , Rfl:  ?  aspirin 81 MG tablet, Take 81 mg by mouth daily., Disp: , Rfl:  ?  atorvastatin (LIPITOR) 80 MG tablet, Take 80 mg by mouth daily., Disp: , Rfl:  ?  Benralizumab (FASENRA) 30 MG/ML SOSY, Inject into the skin., Disp: , Rfl:  ?  Budeson-Glycopyrrol-Formoterol (BREZTRI AEROSPHERE) 160-9-4.8 MCG/ACT AERO, Inhale 2 puffs into the lungs 2 (two) times daily., Disp: 10.7 g, Rfl: 5 ?  calcium citrate-vitamin D (CITRACAL+D) 315-200 MG-UNIT tablet, Take 1 tablet by mouth 2 (two) times daily.,  Disp: , Rfl:  ?  citalopram (CELEXA) 20 MG tablet, Take 20 mg by mouth daily., Disp: , Rfl:  ?  co-enzyme Q-10 30 MG capsule, Take 30 mg by mouth 3 (three) times daily., Disp: , Rfl:  ?  Diclofenac Sodium (VOLTAREN OP), Apply to eye., Disp: , Rfl:  ?  fexofenadine (ALLEGRA) 180 MG tablet, TAKE 1 TABLET (180 MG TOTAL) BY MOUTH 2 (TWO) TIMES DAILY AS NEEDED FOR ALLERGIES OR RHINITIS., Disp: 180 tablet, Rfl: 1 ?  fluticasone (FLONASE) 50 MCG/ACT nasal spray, Place into both nostrils daily., Disp: , Rfl:  ?  fluticasone (FLOVENT HFA) 110 MCG/ACT inhaler, Inhale 2 puffs into the lungs 2 (two) times daily. Take in addition to your Breztri during respiratory illness for at least one week or until symptoms resolve., Disp: 1 each, Rfl: 3 ?  furosemide (LASIX) 20 MG tablet, Take 20 mg by mouth daily as needed., Disp: , Rfl:  ?  gabapentin (NEURONTIN) 400 MG capsule, Take 400 mg by mouth 3 (three) times daily., Disp: , Rfl:  ?  hydrochlorothiazide (HYDRODIURIL) 12.5 MG tablet, Take 12.5 mg by mouth daily., Disp: , Rfl:  ?  ipratropium-albuterol (DUONEB) 0.5-2.5 (3) MG/3ML SOLN, USE 3ML IN NEB EVERY 4 HOURS AS NEEDED, Disp: , Rfl: 2 ?  lisinopril (PRINIVIL,ZESTRIL) 10 MG tablet, Take 10 mg by mouth daily., Disp: , Rfl:  ?  metoprolol (LOPRESSOR) 100 MG tablet, , Disp: , Rfl:  ?  montelukast (SINGULAIR) 10 MG tablet, Take 1 tablet (10 mg total) by mouth daily., Disp: 90 tablet, Rfl: 1 ?  nystatin (MYCOSTATIN) 100000 UNIT/ML suspension, PLEASE SEE ATTACHED FOR DETAILED DIRECTIONS, Disp: , Rfl:  ?  olmesartan (BENICAR) 20 MG tablet, Take 1 tablet (20 mg total) by mouth daily., Disp: 30 tablet, Rfl: 11 ?  Triamcinolone Acetonide 0.025 % LOTN, Apply topically., Disp: , Rfl:  ?  VENTOLIN HFA 108 (90 Base) MCG/ACT inhaler, Inhale 2 puffs into the lungs every 4 (four) hours as needed for wheezing or shortness of breath., Disp: 18 each, Rfl: 1 ?  vitamin C (ASCORBIC ACID) 500 MG tablet, Take 500 mg by mouth daily., Disp: , Rfl:  ?   Vitamin D, Ergocalciferol, (DRISDOL) 1.25 MG (50000 UT) CAPS capsule, TAKE 1 CAPSULE BY MOUTH MONTHLY, Disp: , Rfl:  ? ?Current Facility-Administered Medications:  ?  Benralizumab SOSY 30 mg, 30 mg, Subcutaneous, Q8 Toney Reil, MD, 30 mg at 02/24/22 0840 ? ?Observations/Objective: ?Patient is in no acute distress.  ?Resting comfortably  at home.  ?No labored breathing.  ?Speech is clear and coherent with logical content.  ?Patient is alert and oriented at baseline.  ? ? ?Assessment and Plan: ?1. Cyst of right ovary ?- US Pelvis Complete; Future ? ?We discussed having new pcp arrange f/u imaging as an option - pt prefers I manage this instead to expedite care.  ? ?Follow Up Instructions: ?  I discussed the assessment and treatment plan with the patient. The patient was provided an opportunity to ask questions and all were answered. The patient agreed with the plan and demonstrated an understanding of the instructions.  A copy of instructions were sent to the patient via MyChart unless otherwise noted below.  ? ?The patient was advised to call back or seek an in-person evaluation if the symptoms worsen or if the condition fails to improve as anticipated. ? ?Time:  ?I spent 10 minutes with the patient via telehealth technology discussing the above problems/concerns.   ? ?Carvel Getting, NP ?

## 2022-03-08 NOTE — Patient Instructions (Signed)
?Hydie Langan, thank you for joining Carvel Getting, NP for today's virtual visit.  While this provider is not your primary care provider (PCP), if your PCP is located in our provider database this encounter information will be shared with them immediately following your visit. ? ?Consent: ?(Patient) Charlee Squibb provided verbal consent for this virtual visit at the beginning of the encounter. ? ?Current Medications: ? ?Current Outpatient Medications:  ?  amLODipine (NORVASC) 5 MG tablet, Take 5 mg by mouth daily., Disp: , Rfl:  ?  aspirin 81 MG tablet, Take 81 mg by mouth daily., Disp: , Rfl:  ?  atorvastatin (LIPITOR) 80 MG tablet, Take 80 mg by mouth daily., Disp: , Rfl:  ?  Benralizumab (FASENRA) 30 MG/ML SOSY, Inject into the skin., Disp: , Rfl:  ?  Budeson-Glycopyrrol-Formoterol (BREZTRI AEROSPHERE) 160-9-4.8 MCG/ACT AERO, Inhale 2 puffs into the lungs 2 (two) times daily., Disp: 10.7 g, Rfl: 5 ?  calcium citrate-vitamin D (CITRACAL+D) 315-200 MG-UNIT tablet, Take 1 tablet by mouth 2 (two) times daily., Disp: , Rfl:  ?  citalopram (CELEXA) 20 MG tablet, Take 20 mg by mouth daily., Disp: , Rfl:  ?  co-enzyme Q-10 30 MG capsule, Take 30 mg by mouth 3 (three) times daily., Disp: , Rfl:  ?  Diclofenac Sodium (VOLTAREN OP), Apply to eye., Disp: , Rfl:  ?  fexofenadine (ALLEGRA) 180 MG tablet, TAKE 1 TABLET (180 MG TOTAL) BY MOUTH 2 (TWO) TIMES DAILY AS NEEDED FOR ALLERGIES OR RHINITIS., Disp: 180 tablet, Rfl: 1 ?  fluticasone (FLONASE) 50 MCG/ACT nasal spray, Place into both nostrils daily., Disp: , Rfl:  ?  fluticasone (FLOVENT HFA) 110 MCG/ACT inhaler, Inhale 2 puffs into the lungs 2 (two) times daily. Take in addition to your Breztri during respiratory illness for at least one week or until symptoms resolve., Disp: 1 each, Rfl: 3 ?  furosemide (LASIX) 20 MG tablet, Take 20 mg by mouth daily as needed., Disp: , Rfl:  ?  gabapentin (NEURONTIN) 400 MG capsule, Take 400 mg by mouth 3 (three) times daily., Disp: ,  Rfl:  ?  hydrochlorothiazide (HYDRODIURIL) 12.5 MG tablet, Take 12.5 mg by mouth daily., Disp: , Rfl:  ?  ipratropium-albuterol (DUONEB) 0.5-2.5 (3) MG/3ML SOLN, USE 3ML IN NEB EVERY 4 HOURS AS NEEDED, Disp: , Rfl: 2 ?  lisinopril (PRINIVIL,ZESTRIL) 10 MG tablet, Take 10 mg by mouth daily., Disp: , Rfl:  ?  metoprolol (LOPRESSOR) 100 MG tablet, , Disp: , Rfl:  ?  montelukast (SINGULAIR) 10 MG tablet, Take 1 tablet (10 mg total) by mouth daily., Disp: 90 tablet, Rfl: 1 ?  nystatin (MYCOSTATIN) 100000 UNIT/ML suspension, PLEASE SEE ATTACHED FOR DETAILED DIRECTIONS, Disp: , Rfl:  ?  olmesartan (BENICAR) 20 MG tablet, Take 1 tablet (20 mg total) by mouth daily., Disp: 30 tablet, Rfl: 11 ?  Triamcinolone Acetonide 0.025 % LOTN, Apply topically., Disp: , Rfl:  ?  VENTOLIN HFA 108 (90 Base) MCG/ACT inhaler, Inhale 2 puffs into the lungs every 4 (four) hours as needed for wheezing or shortness of breath., Disp: 18 each, Rfl: 1 ?  vitamin C (ASCORBIC ACID) 500 MG tablet, Take 500 mg by mouth daily., Disp: , Rfl:  ?  Vitamin D, Ergocalciferol, (DRISDOL) 1.25 MG (50000 UT) CAPS capsule, TAKE 1 CAPSULE BY MOUTH MONTHLY, Disp: , Rfl:  ? ?Current Facility-Administered Medications:  ?  Benralizumab SOSY 30 mg, 30 mg, Subcutaneous, Q8 Toney Reil, MD, 30 mg at 02/24/22 0840  ? ?Medications  ordered in this encounter:  ?No orders of the defined types were placed in this encounter. ?  ? ?*If you need refills on other medications prior to your next appointment, please contact your pharmacy* ? ?Follow-Up: ?Call back or seek an in-person evaluation if the symptoms worsen or if the condition fails to improve as anticipated. ? ?Other Instructions ?You will be contacted by [facility] to get your ultrasound appointment scheduled. I will let you know test results after you have the test.  ? ? ?If you have been instructed to have an in-person evaluation today at a local Urgent Care facility, please use the link  below. It will take you to a list of all of our available [facility], including address, phone number and hours of operation. Please do not delay care.  ?[facility] ? ?If you or a family member do not have a primary care provider, use the link below to schedule a visit and establish care. When you choose a [facility] primary care physician or advanced practice provider, you gain a long-term partner in health. ?Find a Primary Care Provider ? ?Learn more about [facility] in-office and virtual care options: ?[facility]  ?

## 2022-03-12 ENCOUNTER — Other Ambulatory Visit (HOSPITAL_COMMUNITY): Payer: Medicare PPO

## 2022-03-14 ENCOUNTER — Other Ambulatory Visit: Payer: Self-pay | Admitting: Internal Medicine

## 2022-03-14 ENCOUNTER — Other Ambulatory Visit: Payer: Self-pay | Admitting: Allergy & Immunology

## 2022-03-16 ENCOUNTER — Other Ambulatory Visit: Payer: Self-pay

## 2022-03-16 ENCOUNTER — Ambulatory Visit (HOSPITAL_COMMUNITY)
Admission: RE | Admit: 2022-03-16 | Discharge: 2022-03-16 | Disposition: A | Discharge status: Home / Self Care | Payer: Medicare PPO | Source: Ambulatory Visit | Attending: Emergency Medicine | Admitting: Emergency Medicine

## 2022-03-16 DIAGNOSIS — N83201 Unspecified ovarian cyst, right side: Secondary | ICD-10-CM | POA: Diagnosis not present

## 2022-03-22 ENCOUNTER — Telehealth: Payer: Self-pay | Admitting: Emergency Medicine

## 2022-03-22 ENCOUNTER — Encounter: Payer: Self-pay | Admitting: Emergency Medicine

## 2022-03-22 DIAGNOSIS — N83299 Other ovarian cyst, unspecified side: Secondary | ICD-10-CM

## 2022-03-22 NOTE — Progress Notes (Signed)
Called and spoke with pt, reviewed results. Referral to gyn placed.

## 2022-03-22 NOTE — Telephone Encounter (Signed)
Reviewed results from pelvic US. Pt has seen gyn Dr. Nelda Marseille in the past and would like to go back to see her again. Referral placed.  ?

## 2022-04-08 ENCOUNTER — Encounter: Payer: Self-pay | Admitting: Obstetrics & Gynecology

## 2022-04-08 ENCOUNTER — Ambulatory Visit: Payer: Medicare PPO | Admitting: Obstetrics & Gynecology

## 2022-04-08 DIAGNOSIS — E669 Obesity, unspecified: Secondary | ICD-10-CM | POA: Diagnosis not present

## 2022-04-08 DIAGNOSIS — N83201 Unspecified ovarian cyst, right side: Secondary | ICD-10-CM

## 2022-04-08 NOTE — Progress Notes (Unsigned)
? ?  GYN VISIT ?Patient name: Debra [name] MRN [id number]  Date of birth: April 14, 1946 ?Chief Complaint:   ?Ovarian Cyst ? ?History of Present Illness:   ?Debra [name] is a 76 y.o. [id number] PM, [place] female being seen today for ovarian cyst. ? ?To review, ovarian cyst was an incidental finding when she had initially presented for prolapse/pelvic pressure.  Today, she denies pelvic or abodminal pain. Denies decreased appetite or bloating.  Cyst was an incidental finding on imaging- summer 2022. ? ?-Recent US: 03/16/22- Absent uterus.  Right ovary- Mildly ?complicated cyst within RIGHT ovary 4.4 x 3.7 x 3.9 cm (volume = 33 ?cm^3), previously 4.7 x 3.2 x 3.2 cm (volume = 25 cm^3), ?containing scattered low level internal echoes. A single wall shows ?questionable irregularity versus mural nodularity. ? ?Prior US- Cyst identified within RIGHT ovary 4.7 x 3.2 x 3.2 cm containing a few scattered internal echoes. No definite mural nodularity or septations. ? ?No LMP recorded. Patient is postmenopausal. ? ? ?  08/06/2021  ? 10:59 AM 11/29/2014  ? 10:52 AM  ?Depression screen PHQ 2/9  ?Decreased Interest 0 0  ?Down, Depressed, Hopeless 0 0  ?PHQ - 2 Score 0 0  ?Altered sleeping 0   ?Tired, decreased energy 1   ?Change in appetite 3   ?Feeling bad or failure about yourself  0   ?Trouble concentrating 0   ?Moving slowly or fidgety/restless 0   ?Suicidal thoughts 0   ?PHQ-9 Score 4   ? ? ? ?Review of Systems:   ?Pertinent items are noted in HPI ?Denies fever/chills, dizziness, headaches, visual disturbances, fatigue, shortness of breath, chest pain, abdominal pain, vomiting,  bowel movements, urination, or intercourse unless otherwise stated above.  ?Pertinent History Reviewed:  ?Reviewed past medical,surgical, social, obstetrical and family history.  ?Reviewed problem list, medications and allergies. ?Physical Assessment:  ? ?Vitals:  ? 04/08/22 0905  ?BP: 125/71  ?Pulse: (!) 55  ?Weight: 181 lb 6.4 oz (82.3 kg)  ?Height: '5\' 2"'$  (1.575 m)   ?Body mass index is 33.18 kg/m?. ? ?     Physical Examination:  ? General appearance: alert, well appearing, and in no distress ? Psych: mood appropriate, normal affect ? Skin: warm & dry  ? Cardiovascular: normal heart rate noted ? Respiratory: normal respiratory effort, no distress ? Abdomen: soft, non-tender  ? Pelvic: VULVA: normal appearing vulva with no masses, tenderness or lesions, VAGINA: normal appearing vagina with normal color and discharge, no lesions, stage 1 cystocele noted ?ADNEXA: right sided fullness appreciated- mobile, non-tender ? Extremities: no calf tenderness bilaterally ? ?Chaperone: Levy Pupa   ? ?Assessment & Plan:  ?1) Ovarian cyst ?-reviewed ? ? ?No orders of the defined types were placed in this encounter. ? ? ?No follow-ups on file. ? ? ?Janyth Pupa, DO ?Attending [place], Faculty Practice ?Center for [place] ? ? ? ?

## 2022-04-08 NOTE — Progress Notes (Signed)
? ?GYN VISIT ?Patient name: Debra [name] MRN [id number]  Date of birth: 17-Jun-1946 ?Chief Complaint:   ?Ovarian cyst ? ?History of Present Illness:   ?Debra [name] is a 76 y.o. [id number] PM, [place] female being seen today for ovarian cyst.    ? ?To review, ovarian cyst was an incidental finding when she had initially presented for prolapse/pelvic pressure.  Today, she denies pelvic or abodminal pain. Denies decreased appetite or bloating.  Cyst was an incidental finding on imaging- summer 2022. ?  ?-Recent US: 03/16/22- Absent uterus.  Right ovary- Mildly ?complicated cyst within RIGHT ovary 4.4 x 3.7 x 3.9 cm (volume = 33 ?cm^3), previously 4.7 x 3.2 x 3.2 cm (volume = 25 cm^3), ?containing scattered low level internal echoes. A single wall shows ?questionable irregularity versus mural nodularity. ?  ?Prior US- Cyst identified within RIGHT ovary 4.7 x 3.2 x 3.2 cm containing a few scattered internal echoes. No definite mural nodularity or septations. ? ?No LMP recorded. Patient is postmenopausal. ? ? ?  04/08/2022  ?  9:25 AM 08/06/2021  ? 10:59 AM 11/29/2014  ? 10:52 AM  ?Depression screen PHQ 2/9  ?Decreased Interest 0 0 0  ?Down, Depressed, Hopeless 0 0 0  ?PHQ - 2 Score 0 0 0  ?Altered sleeping 0 0   ?Tired, decreased energy 1 1   ?Change in appetite 1 3   ?Feeling bad or failure about yourself  0 0   ?Trouble concentrating 0 0   ?Moving slowly or fidgety/restless 0 0   ?Suicidal thoughts 0 0   ?PHQ-9 Score 2 4   ? ? ? ?Review of Systems:   ?Pertinent items are noted in HPI ?Denies fever/chills, dizziness, headaches, visual disturbances, fatigue, shortness of breath, chest pain, abdominal pain, vomiting, bowel movements, urination, or intercourse unless otherwise stated above.  ?Pertinent History Reviewed:  ?Reviewed past medical,surgical, social, obstetrical and family history.  ?Reviewed problem list, medications and allergies. ?Physical Assessment:  ?4/20 @ 0905: Weight 181lbs, Height 5'2", BMI 33.  BP 125/71.  HR  55 ? ?     Physical Examination:  ? General appearance: alert, well appearing, and in no distress ? Psych: mood appropriate, normal affect ? Skin: warm & dry  ? Cardiovascular: normal heart rate noted ? Respiratory: normal respiratory effort, no distress ? Abdomen: obese, soft, non-tender, no rebound, no guarding ? Pelvic: VULVA: normal appearing vulva with no masses, tenderness or lesions, VAGINA: normal appearing vagina with normal color and discharge, no lesions, stage 1 cystocele noted ?ADNEXA: right sided fullness appreciated- mobile, non-tender ? Extremities: no calf tenderness bilaterally ? ?Chaperone: Levy Pupa   ? ?Assessment & Plan:  ?1) Ovarian cyst ? -initially pt thought she had completed MRI due to recent visit to [place] were reviewed and it looks as though this was actually the CT ?-discussed US findings that the ovarian cyst has gotten slightly larger, which may be concerning for ovarian malignancy.  I reviewed images on my own and suspect cyst is similar in size. ?-Discussed management options including proceeding with MRI and potential for surgical intervention.  Alternatively reviewed plan for repeat US in 2-3 mos.  IF cyst enlarged or change in size would then consider surgical intervention ?-Questions and concerns were addressed, she would prefer to proceed with repeat US ? ?-Plan for pelvic US in our office 2-8mo ? ? ?JJanyth Pupa DO ?[facility]ending [facility] ?[facility] [place]? ? ? ?

## 2022-04-21 ENCOUNTER — Ambulatory Visit (INDEPENDENT_AMBULATORY_CARE_PROVIDER_SITE_OTHER): Payer: Medicare PPO

## 2022-04-21 DIAGNOSIS — J4550 Severe persistent asthma, uncomplicated: Secondary | ICD-10-CM | POA: Diagnosis not present

## 2022-05-05 ENCOUNTER — Other Ambulatory Visit: Payer: Self-pay | Admitting: Internal Medicine

## 2022-05-06 NOTE — Telephone Encounter (Signed)
I did one refill for her benicar but see she saw her PCP 04/1722 for  dx ? Copd exac so needs to make f/u ov with me me next available with all meds in hand to regroup and let PCP do the refills for benicar going forward

## 2022-06-10 ENCOUNTER — Other Ambulatory Visit: Payer: Medicare PPO

## 2022-06-11 ENCOUNTER — Encounter: Payer: Self-pay | Admitting: Internal Medicine

## 2022-06-11 ENCOUNTER — Ambulatory Visit: Payer: Medicare PPO | Admitting: Internal Medicine

## 2022-06-11 DIAGNOSIS — J449 Chronic obstructive pulmonary disease, unspecified: Secondary | ICD-10-CM

## 2022-06-16 ENCOUNTER — Ambulatory Visit (INDEPENDENT_AMBULATORY_CARE_PROVIDER_SITE_OTHER): Payer: Medicare PPO

## 2022-06-16 ENCOUNTER — Other Ambulatory Visit: Payer: Self-pay | Admitting: Obstetrics & Gynecology

## 2022-06-16 DIAGNOSIS — N83201 Unspecified ovarian cyst, right side: Secondary | ICD-10-CM

## 2022-06-16 DIAGNOSIS — J4550 Severe persistent asthma, uncomplicated: Secondary | ICD-10-CM

## 2022-06-17 ENCOUNTER — Ambulatory Visit: Payer: Medicare PPO | Admitting: Obstetrics & Gynecology

## 2022-06-17 ENCOUNTER — Encounter: Payer: Self-pay | Admitting: Obstetrics & Gynecology

## 2022-06-17 ENCOUNTER — Ambulatory Visit (INDEPENDENT_AMBULATORY_CARE_PROVIDER_SITE_OTHER): Payer: Medicare PPO

## 2022-06-17 VITALS — BP 125/71 | HR 56 | Wt 175.0 lb

## 2022-06-17 DIAGNOSIS — N838 Other noninflammatory disorders of ovary, fallopian tube and broad ligament: Secondary | ICD-10-CM

## 2022-06-17 DIAGNOSIS — N83201 Unspecified ovarian cyst, right side: Secondary | ICD-10-CM | POA: Diagnosis not present

## 2022-06-17 DIAGNOSIS — Z780 Asymptomatic menopausal state: Secondary | ICD-10-CM | POA: Diagnosis not present

## 2022-06-17 DIAGNOSIS — I10 Essential (primary) hypertension: Secondary | ICD-10-CM

## 2022-06-17 NOTE — Progress Notes (Addendum)
PELVIC US TA/TV: normal vaginal cuff,normal left ovary,5.7 x 4.6 x 4.4 cm complex right adnexal multilocular cyst with internal echoes,minimal color flow,no free fluid,no pain during ultrasound  Chaperone Terrence Dupont

## 2022-06-17 NOTE — Progress Notes (Signed)
GYN VISIT Patient name: Debra [name] MRN [id number]  Date of birth: 06-22-1946 Chief Complaint:   Follow-up (Ovarian cyst and u/s)  History of Present Illness:   Debra [name] is a 76 y.o. [id number] PM, [place] female being seen today for follow up regarding:  -Right ovarian cyst: In review, the cyst has been present for over a year; however, at the last [place] there was some concern that it had increased in size.  Plan was for repeat US in 79mo  [place] (6[place]):  normal vaginal cuff,normal left ovary,5.7 x 4.6 x 4.4 cm complex right adnexal multilocular cyst with internal echoes,minimal color flow,no free fluid,no pain during ultrasound  Last [place] 03/16/22- Absent uterus.  Right ovary- Mildly complicated cyst within RIGHT ovary 4.4 x 3.7 x 3.9 cm (volume = 33 cm^3), previously 4.7 x 3.2 x 3.2 cm (volume = 25 cm^3), containing scattered low level internal echoes. A single wall shows questionable irregularity versus mural nodularity.  Denies pelvic or abdominal pain.  She has recently noted decreased appetite and occasional feeling of fullness and bloating.  Reports weight loss  Denies vaginal discharge, itching or irritation.  No acute complaints No LMP recorded. Patient is postmenopausal.     04/08/2022    9:25 AM 08/06/2021   10:59 AM 11/29/2014   10:52 AM  Depression screen PHQ 2/9  Decreased Interest 0 0 0  Down, Depressed, Hopeless 0 0 0  PHQ - 2 Score 0 0 0  Altered sleeping 0 0   Tired, decreased energy 1 1   Change in appetite 1 3   Feeling bad or failure about yourself  0 0   Trouble concentrating 0 0   Moving slowly or fidgety/restless 0 0   Suicidal thoughts 0 0   PHQ-9 Score 2 4      Review of Systems:   Pertinent items are noted in HPI Denies fever/chills, dizziness, headaches, visual disturbances, fatigue, shortness of breath, chest pain, abdominal pain, vomiting, denies problems with bowel movements, urination, or intercourse unless otherwise stated above.  Pertinent  History Reviewed:  Reviewed past medical,surgical, social, obstetrical and family history.  Reviewed problem list, medications and allergies. Physical Assessment:   Vitals:   06/17/22 1004  BP: 125/71  Pulse: (!) 56  Weight: 175 lb (79.4 kg)  Body mass index is 32.01 kg/m.       Physical Examination:   General appearance: alert, well appearing, and in no distress  Psych: mood appropriate, normal affect  Skin: warm & dry   Cardiovascular: normal heart rate noted  Respiratory: normal respiratory effort, no distress  Abdomen: obese, soft, non-tender, no rebound, no guarding  Pelvic: examination not indicated  Extremities: no edema, no calf tenderness bilaterally  Chaperone: N/A    Assessment & Plan:  1) Right ovarian mass -reviewed today's [place] to prior [place]  Findings are still suggestive of benign findings however, size continues to slowly increase and the loculation has changed.  Additionally it does seem like she is now having some mild symptoms- at this time advised pt to reconsider surgical intervention -discussed conservative vs surgical intervention -Reviewed surgical intervention of laparoscopic bilateral salpingo-oophorectomy -reviewed recovery and hospital expectations -questions and concerns were addressed- desires to proceed with surgical intervention -plan for ROMA- if low risk will plan to proceed with scheduling.  Reviewed if high risk will refer to gyn/oncology -preop labs will be ordered -will also obtain preop clearance from PCP/Cardiology prior to proceeding  Orders Placed This Encounter  Procedures   Ovarian Malignancy Risk-ROMA     Janyth Pupa, DO Attending [place], [facility] for [facility], [place]

## 2022-06-18 LAB — OVARIAN MALIGNANCY RISK-ROMA
Cancer Antigen (CA) 125: 34.8 U/mL (ref 0.0–38.1)
HE4: 158 pmol/L — ABNORMAL HIGH (ref 0.0–96.9)
Postmenopausal ROMA: 4.44 — ABNORMAL HIGH
Premenopausal ROMA: 5.67 — ABNORMAL HIGH

## 2022-06-18 LAB — PREMENOPAUSAL INTERP: HIGH

## 2022-06-18 LAB — POSTMENOPAUSAL INTERP: HIGH

## 2022-06-25 ENCOUNTER — Telehealth: Payer: Self-pay | Admitting: *Deleted

## 2022-06-25 NOTE — Telephone Encounter (Signed)
Spoke with the patient regarding the referral to GYN oncology. Patient scheduled a new patient with Dr Berline Lopes  on 7/24 at 9:45 am. Patient given an arrival time of 9:15 am.  Explained to the patient the the doctor will perform a pelvic exam at this visit. Patient given the policy that no visitors under the 16 yrs are a loud in the [facility]. Patient given the address/phone number for the clinic and that the center offers free valet service.

## 2022-07-07 ENCOUNTER — Other Ambulatory Visit: Payer: Self-pay | Admitting: Allergy & Immunology

## 2022-07-07 ENCOUNTER — Encounter: Payer: Self-pay | Admitting: Gynecologic Oncology

## 2022-07-12 ENCOUNTER — Inpatient Hospital Stay (HOSPITAL_BASED_OUTPATIENT_CLINIC_OR_DEPARTMENT_OTHER): Payer: Medicare PPO | Admitting: Gynecologic Oncology

## 2022-07-12 ENCOUNTER — Inpatient Hospital Stay: Payer: Medicare PPO | Attending: Gynecologic Oncology | Admitting: Gynecologic Oncology

## 2022-07-12 ENCOUNTER — Inpatient Hospital Stay: Payer: Medicare PPO

## 2022-07-12 ENCOUNTER — Other Ambulatory Visit: Payer: Self-pay

## 2022-07-12 ENCOUNTER — Encounter: Payer: Self-pay | Admitting: Gynecologic Oncology

## 2022-07-12 VITALS — BP 137/72 | HR 59 | Temp 98.5°F | Resp 16 | Ht 62.0 in | Wt 172.0 lb

## 2022-07-12 DIAGNOSIS — Z8042 Family history of malignant neoplasm of prostate: Secondary | ICD-10-CM | POA: Diagnosis not present

## 2022-07-12 DIAGNOSIS — N83209 Unspecified ovarian cyst, unspecified side: Secondary | ICD-10-CM

## 2022-07-12 DIAGNOSIS — Z7982 Long term (current) use of aspirin: Secondary | ICD-10-CM | POA: Insufficient documentation

## 2022-07-12 DIAGNOSIS — J449 Chronic obstructive pulmonary disease, unspecified: Secondary | ICD-10-CM | POA: Insufficient documentation

## 2022-07-12 DIAGNOSIS — Z79899 Other long term (current) drug therapy: Secondary | ICD-10-CM | POA: Insufficient documentation

## 2022-07-12 DIAGNOSIS — Z6831 Body mass index (BMI) 31.0-31.9, adult: Secondary | ICD-10-CM

## 2022-07-12 DIAGNOSIS — Z90710 Acquired absence of both cervix and uterus: Secondary | ICD-10-CM | POA: Insufficient documentation

## 2022-07-12 DIAGNOSIS — N2581 Secondary hyperparathyroidism of renal origin: Secondary | ICD-10-CM | POA: Diagnosis not present

## 2022-07-12 DIAGNOSIS — E785 Hyperlipidemia, unspecified: Secondary | ICD-10-CM | POA: Diagnosis not present

## 2022-07-12 DIAGNOSIS — I129 Hypertensive chronic kidney disease with stage 1 through stage 4 chronic kidney disease, or unspecified chronic kidney disease: Secondary | ICD-10-CM | POA: Diagnosis not present

## 2022-07-12 DIAGNOSIS — D3911 Neoplasm of uncertain behavior of right ovary: Secondary | ICD-10-CM | POA: Insufficient documentation

## 2022-07-12 DIAGNOSIS — R978 Other abnormal tumor markers: Secondary | ICD-10-CM | POA: Insufficient documentation

## 2022-07-12 DIAGNOSIS — Z7952 Long term (current) use of systemic steroids: Secondary | ICD-10-CM | POA: Diagnosis not present

## 2022-07-12 DIAGNOSIS — E1122 Type 2 diabetes mellitus with diabetic chronic kidney disease: Secondary | ICD-10-CM | POA: Diagnosis not present

## 2022-07-12 DIAGNOSIS — N189 Chronic kidney disease, unspecified: Secondary | ICD-10-CM | POA: Insufficient documentation

## 2022-07-12 LAB — CEA (IN HOUSE-CHCC): CEA (CHCC-In House): 2.5 ng/mL (ref 0.00–5.00)

## 2022-07-12 LAB — COMPREHENSIVE METABOLIC PANEL
ALT: 15 U/L (ref 0–44)
AST: 18 U/L (ref 15–41)
Albumin: 4.2 g/dL (ref 3.5–5.0)
Alkaline Phosphatase: 68 U/L (ref 38–126)
Anion gap: 5 (ref 5–15)
BUN: 18 mg/dL (ref 8–23)
CO2: 34 mmol/L — ABNORMAL HIGH (ref 22–32)
Calcium: 9.9 mg/dL (ref 8.9–10.3)
Chloride: 104 mmol/L (ref 98–111)
Creatinine, Ser: 1.36 mg/dL — ABNORMAL HIGH (ref 0.44–1.00)
GFR, Estimated: 41 mL/min — ABNORMAL LOW (ref >60)
Glucose, Bld: 103 mg/dL — ABNORMAL HIGH (ref 70–99)
Potassium: 4.2 mmol/L (ref 3.5–5.1)
Sodium: 143 mmol/L (ref 135–145)
Total Bilirubin: 0.5 mg/dL (ref 0.3–1.2)
Total Protein: 7.3 g/dL (ref 6.5–8.1)

## 2022-07-12 MED ORDER — SENNOSIDES-DOCUSATE SODIUM 8.6-50 MG PO TABS: 2.0000 | ORAL_TABLET | Freq: Every day | ORAL | 0 refills | Status: DC

## 2022-07-12 MED ORDER — TRAMADOL HCL 50 MG PO TABS: 50.0000 mg | ORAL_TABLET | Freq: Four times a day (QID) | ORAL | 0 refills | Status: DC | PRN

## 2022-07-12 NOTE — Progress Notes (Signed)
Patient here for new patient consultation with Dr. Jeral Pinch and for a pre-operative discussion prior to her scheduled surgery on July 27, 2022. She is scheduled for robotic assisted laparoscopic bilateral salpingo-oophorectomy, possible staging if a cancer is identified, possible laparotomy. The surgery was discussed in detail.  See after visit summary for additional details. Visual aids used to discuss items related to surgery.     Discussed post-op pain management in detail including the aspects of the enhanced recovery pathway.  Advised her that a new prescription would be sent in for tramadol and it is only to be used for after her upcoming surgery.  We discussed the use of tylenol post-op and to monitor for a maximum of 4,000 mg in a 24 hour period.  Also prescribed sennakot to be used after surgery and to hold if having loose stools.  Discussed bowel regimen in detail.     Discussed the use of SCDs and measures to take at home to prevent DVT including frequent mobility.  Reportable signs and symptoms of DVT discussed. Post-operative instructions discussed and expectations for after surgery. Incisional care discussed as well including reportable signs and symptoms including erythema, drainage, wound separation.     10 minutes spent with the patient.  Verbalizing understanding of material discussed. No needs or concerns voiced at the end of the visit.   Advised patient to call for any needs.  Advised that her post-operative medications had been prescribed and could be picked up at any time. Upcoming CT scan instructions discussed and contrast given.  This appointment is included in the global surgical bundle as pre-operative teaching and has no charge.

## 2022-07-12 NOTE — Patient Instructions (Addendum)
Plan to have a CT scan prior to surgery and you will be notified of the results.  Preparing for your Surgery  Plan for surgery on July 27, 2022 with Dr. Jeral Pinch at [facility] will be scheduled for robotic assisted laparoscopic bilateral salpingo-oophorectomy (removal of both ovaries and fallopian tubes), possible staging if a cancer is identified, possible laparotomy (larger incision on your abdomen if needed).   Pre-operative Testing -You will receive a phone call from presurgical testing at [facility] to arrange for a pre-operative appointment and lab work.  -Bring your insurance card, copy of an advanced directive if applicable, medication list  -At that visit, you will be asked to sign a consent for a possible blood transfusion in case a transfusion becomes necessary during surgery.  The need for a blood transfusion is rare but having consent is a necessary part of your care.     -You should not be taking blood thinners or aspirin at least ten days prior to surgery unless instructed by your surgeon.  -Do not take supplements such as fish oil (omega 3), red yeast rice, turmeric before your surgery. You want to avoid medications with aspirin in them including headache powders such as BC or Goody's), Excedrin migraine.  Day Before Surgery at [place] will be asked to take in a light diet the day before surgery. You will be advised you can have clear liquids up until 3 hours before your surgery.    Eat a light diet the day before surgery.  Examples including soups, broths, toast, yogurt, mashed potatoes.  AVOID GAS PRODUCING FOODS. Things to avoid include carbonated beverages (fizzy beverages, sodas), raw fruits and raw vegetables (uncooked), or beans.   If your bowels are filled with gas, your surgeon will have difficulty visualizing your pelvic organs which increases your surgical risks.  Your role in recovery Your role is to become active as soon as  directed by your doctor, while still giving yourself time to heal.  Rest when you feel tired. You will be asked to do the following in order to speed your recovery:  - Cough and breathe deeply. This helps to clear and expand your lungs and can prevent pneumonia after surgery.  - [place]. Do mild physical activity. Walking or moving your legs help your circulation and body functions return to normal. Do not try to get up or walk alone the first time after surgery.   -If you develop swelling on one leg or the other, pain in the back of your leg, redness/warmth in one of your legs, please call the office or go to the Emergency Room to have a doppler to rule out a blood clot. For shortness of breath, chest pain-seek care in the Emergency Room as soon as possible. - Actively manage your pain. Managing your pain lets you move in comfort. We will ask you to rate your pain on a scale of zero to 10. It is your responsibility to tell your doctor or nurse where and how much you hurt so your pain can be treated.  Special Considerations -If you are diabetic, you may be placed on insulin after surgery to have closer control over your blood sugars to promote healing and recovery.  This does not mean that you will be discharged on insulin.  If applicable, your oral antidiabetics will be resumed when you are tolerating a solid diet.  -Your final pathology results from surgery should be available  around one week after surgery and the results will be relayed to you when available.  -Dr. Lahoma Crocker is the surgeon that assists your GYN Oncologist with surgery.  If you end up staying the night, the next day after your surgery you will either see Dr. Berline Lopes or Dr. Lahoma Crocker.  -FMLA forms can be faxed to [phone] and please allow 5-7 business days for completion.  Pain Management After Surgery -You have been prescribed your pain medication and bowel regimen medications before  surgery so that you can have these available when you are discharged from the hospital. The pain medication is for use ONLY AFTER surgery and a new prescription will not be given.   -Make sure that you have Tylenol and Ibuprofen IF YOU ARE ABLE TO TAKE THESE MEDICATIONS at home to use on a regular basis after surgery for pain control. We recommend alternating the medications every hour to six hours since they work differently and are processed in the body differently for pain relief.  -Review the attached handout on narcotic use and their risks and side effects.   Bowel Regimen -You have been prescribed Sennakot-S to take nightly to prevent constipation especially if you are taking the narcotic pain medication intermittently.  It is important to prevent constipation and drink adequate amounts of liquids. You can stop taking this medication when you are not taking pain medication and you are back on your normal bowel routine.  Risks of Surgery Risks of surgery are low but include bleeding, infection, damage to surrounding structures, re-operation, blood clots, and very rarely death.   Blood Transfusion Information (For the consent to be signed before surgery)  We will be checking your blood type before surgery so in case of emergencies, we will know what type of blood you would need.                                            WHAT IS A BLOOD TRANSFUSION?  A transfusion is the replacement of blood or some of its parts. Blood is made up of multiple cells which provide different functions. Red blood cells carry oxygen and are used for blood loss replacement. White blood cells fight against infection. Platelets control bleeding. Plasma helps clot blood. Other blood products are available for specialized needs, such as hemophilia or other clotting disorders. BEFORE THE TRANSFUSION  Who gives blood for transfusions?  You may be able to donate blood to be used at a later date on yourself  (autologous donation). Relatives can be asked to donate blood. This is generally not any safer than if you have received blood from a stranger. The same precautions are taken to ensure safety when a relative's blood is donated. Healthy volunteers who are fully evaluated to make sure their blood is safe. This is blood bank blood. Transfusion therapy is the safest it has ever been in the practice of medicine. Before blood is taken from a donor, a complete history is taken to make sure that person has no history of diseases nor engages in risky social behavior (examples are intravenous drug use or sexual activity with multiple partners). The donor's travel history is screened to minimize risk of transmitting infections, such as malaria. The donated blood is tested for signs of infectious diseases, such as HIV and hepatitis. The blood is then tested to be sure it is compatible with  you in order to minimize the chance of a transfusion reaction. If you or a relative donates blood, this is often done in anticipation of surgery and is not appropriate for emergency situations. It takes many days to process the donated blood. RISKS AND COMPLICATIONS Although transfusion therapy is very safe and saves many lives, the main dangers of transfusion include:  Getting an infectious disease. Developing a transfusion reaction. This is an allergic reaction to something in the blood you were given. Every precaution is taken to prevent this. The decision to have a blood transfusion has been considered carefully by your caregiver before blood is given. Blood is not given unless the benefits outweigh the risks.  AFTER SURGERY INSTRUCTIONS  Return to work: 4-6 weeks if applicable  Activity: 1. Be up and out of the bed during the day.  Take a nap if needed.  You may walk up steps but be careful and use the hand rail.  Stair climbing will tire you more than you think, you may need to stop part way and rest.   2. No lifting or  straining for 6 weeks over 10 pounds. No pushing, pulling, straining for 6 weeks.  3. No driving for around 1 week(s).  Do not drive if you are taking narcotic pain medicine and make sure that your reaction time has returned.   4. You can shower as soon as the next day after surgery. Shower daily.  Use your regular soap and water (not directly on the incision) and pat your incision(s) dry afterwards; don't rub.  No tub baths or submerging your body in water until cleared by your surgeon. If you have the soap that was given to you by pre-surgical testing that was used before surgery, you do not need to use it afterwards because this can irritate your incisions.   5. No sexual activity and nothing in the vagina for 4 weeks.  6. You may experience a small amount of clear drainage from your incisions, which is normal.  If the drainage persists, increases, or changes color please call the office.  7. Do not use creams, lotions, or ointments such as neosporin on your incisions after surgery until advised by your surgeon because they can cause removal of the dermabond glue on your incisions.    8. Take Tylenol or ibuprofen first for pain if you are able to take these medications and only use narcotic pain medication for severe pain not relieved by the Tylenol or Ibuprofen.  Monitor your Tylenol intake to a max of 4,000 mg in a 24 hour period. You can alternate these medications after surgery.  Diet: 1. Low sodium Heart Healthy Diet is recommended but you are cleared to resume your normal (before surgery) diet after your procedure.  2. It is safe to use a laxative, such as Miralax or Colace, if you have difficulty moving your bowels. You have been prescribed Sennakot-S to take at bedtime every evening after surgery to keep bowel movements regular and to prevent constipation.    Wound Care: 1. Keep clean and dry.  Shower daily.  Reasons to call the Doctor: Fever - Oral temperature greater than 100.4  degrees Fahrenheit Foul-smelling vaginal discharge Difficulty urinating Nausea and vomiting Increased pain at the site of the incision that is unrelieved with pain medicine. Difficulty breathing with or without chest pain New calf pain especially if only on one side Sudden, continuing increased vaginal bleeding with or without clots.   Contacts: For questions or concerns  you should contact:  Dr. Jeral Pinch at [phone]  Joylene John, NP at [phone]  After Hours: call [phone] and have the GYN Oncologist paged/contacted (after 5 pm or on the weekends).  Messages sent via mychart are for non-urgent matters and are not responded to after hours so for urgent needs, please call the after hours number.

## 2022-07-12 NOTE — H&P (View-Only) (Signed)
GYNECOLOGIC ONCOLOGY NEW PATIENT CONSULTATION   Patient Name: Debra [name]  Patient Age: 76 y.o. Date of Service: 07/12/22 Referring Provider: Otis Peak, MD  Primary Care Provider: Lafonda Mosses, MD Consulting Provider: Jeral Pinch, MD   Assessment/Plan:  Postmenopausal patient with complex adnexal mass, elevated tumor markers.  Discussed with the patient and her granddaughter findings from recent ultrasound imaging showing a complex adnexal mass, likely arising from the right ovary.  This was first identified approximately 1 year ago on ultrasound.  There has been a small increase in size of the lesion over the last year (approximately 1 cm) and some ultrasound features that raise the concern for possible malignancy.  Tumor markers were recently performed and HE4 and ROMA are both elevated.  Luckily, the patient is overall relatively asymptomatic although has had some mild, intermittent abdominal symptoms.  In the setting of her exam today and her symptoms, I recommend that we proceed with CT scan prior to surgery to evaluate for evidence of metastatic disease.  I recommended that we proceed with diagnostic surgery via a minimally invasive approach.  Plan would be for robotic bilateral salpingo-oophorectomy.  The patient has already undergone total hysterectomy.  Plan would be to place the adnexal mass in an Endo Catch bag for contained cyst drainage.  The mass would then be sent for frozen section.  If benign, no additional procedures would be performed.  In the setting of a borderline tumor, we discussed recommendation for additional staging procedures including omentectomy and peritoneal biopsies.  If malignancy was identified, then lymphadenectomy would also be performed.  We discussed the plan for a robotic assisted bilateral salpingo-oophorectomy, possible staging including lymph node dissection, possible laparotomy. The risks of surgery were discussed in detail and she  understands these to include infection; wound separation; hernia; vaginal cuff separation, injury to adjacent organs such as bowel, bladder, blood vessels, ureters and nerves; bleeding which may require blood transfusion; anesthesia risk; thromboembolic events; possible death; unforeseen complications; possible need for re-exploration; medical complications such as heart attack, stroke, pleural effusion and pneumonia; and, if full lymphadenectomy is performed the risk of lymphedema and lymphocyst. The patient will receive DVT and antibiotic prophylaxis as indicated. She voiced a clear understanding. She had the opportunity to ask questions. Perioperative instructions were reviewed with her. Prescriptions for post-op medications were sent to her pharmacy of choice.  We will plan to get a CEA with her preoperative labs.  Given her pulmonary disease, we will obtain pulmonary clearance.  A copy of this note was sent to the patient's referring provider.   65 minutes of total time was spent for this patient encounter, including preparation, face-to-face counseling with the patient and coordination of care, and documentation of the encounter.   Jeral Pinch, MD  Division of Gynecologic Oncology  Department of Obstetrics and Gynecology  [facility]  ___________________________________________  Chief Complaint: Chief Complaint  Patient presents with   Cyst of ovary, unspecified laterality   Elevated tumor markers    History of Present Illness:  Debra [name] is a 76 y.o. y.o. female who is seen in consultation at the request of Dr. Nelda Marseille for an evaluation of a complex adnexal cyst, elevated tumor markers.  In 07/2021, patient underwent pelvic ultrasound exam after renal ultrasound showed a right ovarian cyst.  On transabdominal images, right ovary measured 6.1 x 4.4 x 5 cm.  Cystic lesion was identified measuring up to 4.7 cm containing a few scattered internal echoes,  node  definitive mural nodularity or septations.  Follow-up pelvic ultrasound, again with transabdominal images only, in 02/2022, showed mildly complex cyst within the right ovary measuring 4.4 x 3.7 x 3.9 cm containing scattered low-level internal echoes.  A single wall shows questionable irregularity versus mural nodularity.  Repeat ultrasound on 06/17/2022 shows right cystic lesion now measures 5.7 x 4.6 x 4.4 centimeters, smooth walled, multiloculated cyst with internal echoes and minimal flow.  No ascites noted.  Appearing left ovary.  CA-125 was 34.8, HE4 was elevated at 158 and Postmenopausal ROMA elevated at 4.44.   The patient presents with her granddaughter today.  She notes overall doing well.  She has noticed some increasing abdominal girth over the last couple of months.  Endorses a good appetite without nausea or emesis.  Denies any early satiety or bloating.  Over the weekend, she she visited her sister and had some "queasiness" although describes this as the queasiness that does not feel like you are going to throw up.  She endorses regular bowel and bladder function.  She denies any pelvic or abdominal pain.  Patient has COPD and asthma.  Follows with a pulmonologist in [place].  Uses her rescue inhaler approximately 1 time a week.  Surgical history is notable for total hysterectomy performed in 1989 for fibroid uterus and heavy bleeding.  She was told that at least both ovaries were left in situ.  Is unsure whether her fallopian tubes were left.  PAST MEDICAL HISTORY:  Past Medical History:  Diagnosis Date   Allergic rhinitis    Arthritis    Asthma    Chronic kidney disease (CKD)    COPD (chronic obstructive pulmonary disease) ([place])    Diabetes mellitus without complication (HCC)    Hyperlipidemia    Hypertension    Hypertensive chronic kidney disease    Recurrent upper respiratory infection (URI)    Secondary hyperparathyroidism of renal origin ([place])      PAST  SURGICAL HISTORY:  Past Surgical History:  Procedure Laterality Date   CHOLECYSTECTOMY     CORONARY ARTERY BYPASS GRAFT     PARTIAL HYSTERECTOMY  1981   ovaries left in situ, was done for fibroids    OB/GYN HISTORY:  OB History  Gravida Para Term Preterm AB Living  '5 4 3 1 1 4  '$ SAB IAB Ectopic Multiple Live Births  1       4    # Outcome Date GA Lbr Len/2nd Weight Sex Delivery Anes PTL Lv  5 SAB           4 Preterm     M Vag-Spont   LIV  3 Term     M Vag-Spont   LIV  2 Term     F Vag-Spont   LIV  1 Term     F Vag-Spont   LIV    No LMP recorded. Patient is postmenopausal.  Age at menarche: 53 Age at menopause:  Stopped in 1989 at the time of her hysterectomy, had menopausal symptoms starting in her 68s. Hx of HRT: Denies Hx of STDs: Denies Last pap: Unsure History of abnormal pap smears: Denies  SCREENING STUDIES:  Last mammogram: 2022  Last colonoscopy: 2021  MEDICATIONS: Outpatient Encounter Medications as of 07/12/2022  Medication Sig   Acetaminophen-guaiFENesin (MUCINEX COLD & FLU) 325-200 MG CAPS Take by mouth.   amLODipine (NORVASC) 5 MG tablet Take 5 mg by mouth daily.   aspirin 81 MG tablet Take 81 mg by mouth daily.  atorvastatin (LIPITOR) 80 MG tablet Take 80 mg by mouth daily.   azithromycin (ZITHROMAX) 250 MG tablet Take by mouth.   Benralizumab (FASENRA) 30 MG/ML SOSY Inject into the skin.   Budeson-Glycopyrrol-Formoterol (BREZTRI AEROSPHERE) 160-9-4.8 MCG/ACT AERO Inhale 2 puffs into the lungs 2 (two) times daily.   calcium citrate-vitamin D (CITRACAL+D) 315-200 MG-UNIT tablet Take 1 tablet by mouth 2 (two) times daily.   cefdinir (OMNICEF) 300 MG capsule Take 300 mg by mouth every 12 (twelve) hours.   citalopram (CELEXA) 20 MG tablet Take 20 mg by mouth daily.   fexofenadine (ALLEGRA) 180 MG tablet TAKE 1 TABLET (180 MG TOTAL) BY MOUTH 2 (TWO) TIMES DAILY AS NEEDED FOR ALLERGIES OR RHINITIS.   fluticasone (FLONASE) 50 MCG/ACT nasal spray Place into  both nostrils daily.   furosemide (LASIX) 20 MG tablet Take 20 mg by mouth daily as needed.   gabapentin (NEURONTIN) 400 MG capsule Take 400 mg by mouth 3 (three) times daily.   hydrochlorothiazide (HYDRODIURIL) 12.5 MG tablet Take 12.5 mg by mouth daily.   ipratropium-albuterol (DUONEB) 0.5-2.5 (3) MG/3ML SOLN USE 3ML IN NEB EVERY 4 HOURS AS NEEDED   metoprolol (LOPRESSOR) 100 MG tablet    olmesartan (BENICAR) 20 MG tablet TAKE 1 TABLET BY MOUTH EVERY DAY   predniSONE (DELTASONE) 20 MG tablet Take 40 mg by mouth daily.   senna-docusate (SENOKOT-S) 8.6-50 MG tablet Take 2 tablets by mouth at bedtime. For AFTER surgery, do not take if having diarrhea   traMADol (ULTRAM) 50 MG tablet Take 1 tablet (50 mg total) by mouth every 6 (six) hours as needed for severe pain. For AFTER surgery only, do not take and drive   VENTOLIN HFA 108 (90 Base) MCG/ACT inhaler Inhale 2 puffs into the lungs every 4 (four) hours as needed for wheezing or shortness of breath.   vitamin C (ASCORBIC ACID) 500 MG tablet Take 500 mg by mouth daily.   Vitamin D, Ergocalciferol, (DRISDOL) 1.25 MG (50000 UT) CAPS capsule TAKE 1 CAPSULE BY MOUTH MONTHLY   [DISCONTINUED] co-enzyme Q-10 30 MG capsule Take 30 mg by mouth 3 (three) times daily.   [DISCONTINUED] montelukast (SINGULAIR) 10 MG tablet TAKE 1 TABLET BY MOUTH EVERY DAY   [DISCONTINUED] Diclofenac Sodium (VOLTAREN OP) Apply to eye.   [DISCONTINUED] nystatin (MYCOSTATIN) 100000 UNIT/ML suspension PLEASE SEE ATTACHED FOR DETAILED DIRECTIONS (Patient not taking: Reported on 06/17/2022)   [DISCONTINUED] Triamcinolone Acetonide 0.025 % LOTN Apply topically. (Patient not taking: Reported on 06/17/2022)   Facility-Administered Encounter Medications as of 07/12/2022  Medication   Benralizumab SOSY 30 mg    ALLERGIES:  Allergies  Allergen Reactions   Aspirin Other (See Comments)    GI upset and burns per patient.      FAMILY HISTORY:  Family History  Problem Relation Age of  Onset   Stroke Mother    Cancer Father    Prostate cancer Father    Alcohol abuse Sister    Heart attack Brother    Alcohol abuse Brother    Other Brother        MVA   Asthma Daughter    Miscarriages / [place] Daughter    Diabetes Son    Allergic rhinitis Neg Hx    Eczema Neg Hx    Immunodeficiency Neg Hx    Urticaria Neg Hx    Angioedema Neg Hx    Atopy Neg Hx      SOCIAL HISTORY:  Social Connections: Moderately Isolated (04/08/2022)   Social Connection and  Isolation Panel [NHANES]    Frequency of Communication with Friends and Family: More than three times a week    Frequency of Social Gatherings with Friends and Family: Three times a week    Attends Religious Services: Never    Active Member of Clubs or Organizations: No    Attends Music therapist: Never    Marital Status: Married    REVIEW OF SYSTEMS:  + Shortness of breath, wheezing, cough, leg swelling, hot flashes, back pain, muscle cramps/pain, problem walking, headache. Denies appetite changes, fevers, chills, fatigue, unexplained weight changes. Denies hearing loss, neck lumps or masses, mouth sores, ringing in ears or voice changes. Denies chest pain or palpitations.  Denies abdominal distention, pain, blood in stools, constipation, diarrhea, nausea, vomiting, or early satiety. Denies pain with intercourse, dysuria, frequency, hematuria or incontinence. Denies pelvic pain, vaginal bleeding or vaginal discharge.   Denies joint pain. Denies itching, rash, or wounds. Denies dizziness, numbness or seizures. Denies swollen lymph nodes or glands, denies easy bruising or bleeding. Denies anxiety, depression, confusion, or decreased concentration.  Physical Exam:  Vital Signs for this encounter:  Blood pressure 137/72, pulse (!) 59, temperature 98.5 F (36.9 C), temperature source Oral, resp. rate 16, height '5\' 2"'$  (1.575 m), weight 172 lb (78 kg), SpO2 96 %. Body mass index is 31.46  kg/m. General: Alert, oriented, no acute distress.  HEENT: Normocephalic, atraumatic. Sclera anicteric.  Chest: Mildly decreased breath sounds at lung bases, expiratory wheezes appreciated bilaterally. Cardiovascular: Regular rate and rhythm, no murmurs, rubs, or gallops.  Abdomen: Obese. Normoactive bowel sounds. Soft, some mild distention in the upper abdomen, nontender to palpation. No masses or hepatosplenomegaly appreciated. No palpable fluid wave.  Extremities: Grossly normal range of motion. Warm, well perfused. 1+ edema bilaterally.  Skin: No rashes or lesions.  Lymphatics: No cervical, supraclavicular, or inguinal adenopathy.  GU:  Normal external female genitalia. No lesions. No discharge or bleeding.             Bladder/urethra:  No lesions or masses.             Vagina: Mildly atrophic, with no lesions or masses noted.             Cervix/uterus: Surgically absent.  Cuff intact.             Adnexa: Inferior aspect of mass appreciated along the right pelvic sidewall, minimally mobile, smooth, no nodularity appreciated.  Rectal: Deferred.  LABORATORY AND RADIOLOGIC DATA:  Outside medical records were reviewed to synthesize the above history, along with the history and physical obtained during the visit.   Lab Results  Component Value Date   WBC 16.2 (H) 08/15/2019   HGB 12.4 08/15/2019   HCT 36.1 08/15/2019   PLT 325 12/19/2017   GLUCOSE 103 (H) 07/12/2022   ALT 15 07/12/2022   AST 18 07/12/2022   NA 143 07/12/2022   K 4.2 07/12/2022   CL 104 07/12/2022   CREATININE 1.36 (H) 07/12/2022   BUN 18 07/12/2022   CO2 34 (H) 07/12/2022

## 2022-07-12 NOTE — Patient Instructions (Signed)
Plan to have a CT scan prior to surgery and you will be notified of the results.   Preparing for your Surgery   Plan for surgery on July 27, 2022 with Dr. Jeral Pinch at [facility] will be scheduled for robotic assisted laparoscopic bilateral salpingo-oophorectomy (removal of both ovaries and fallopian tubes), possible staging if a cancer is identified, possible laparotomy (larger incision on your abdomen if needed).    Pre-operative Testing -You will receive a phone call from presurgical testing at [facility] to arrange for a pre-operative appointment and lab work.   -Bring your insurance card, copy of an advanced directive if applicable, medication list   -At that visit, you will be asked to sign a consent for a possible blood transfusion in case a transfusion becomes necessary during surgery.  The need for a blood transfusion is rare but having consent is a necessary part of your care.      -You should not be taking blood thinners or aspirin at least ten days prior to surgery unless instructed by your surgeon.   -Do not take supplements such as fish oil (omega 3), red yeast rice, turmeric before your surgery. You want to avoid medications with aspirin in them including headache powders such as BC or Goody's), Excedrin migraine.   Day Before Surgery at [place] will be asked to take in a light diet the day before surgery. You will be advised you can have clear liquids up until 3 hours before your surgery.     Eat a light diet the day before surgery.  Examples including soups, broths, toast, yogurt, mashed potatoes.  AVOID GAS PRODUCING FOODS. Things to avoid include carbonated beverages (fizzy beverages, sodas), raw fruits and raw vegetables (uncooked), or beans.    If your bowels are filled with gas, your surgeon will have difficulty visualizing your pelvic organs which increases your surgical risks.   Your role in recovery Your role is to become active as  soon as directed by your doctor, while still giving yourself time to heal.  Rest when you feel tired. You will be asked to do the following in order to speed your recovery:   - Cough and breathe deeply. This helps to clear and expand your lungs and can prevent pneumonia after surgery.  - [place]. Do mild physical activity. Walking or moving your legs help your circulation and body functions return to normal. Do not try to get up or walk alone the first time after surgery.   -If you develop swelling on one leg or the other, pain in the back of your leg, redness/warmth in one of your legs, please call the office or go to the Emergency Room to have a doppler to rule out a blood clot. For shortness of breath, chest pain-seek care in the Emergency Room as soon as possible. - Actively manage your pain. Managing your pain lets you move in comfort. We will ask you to rate your pain on a scale of zero to 10. It is your responsibility to tell your doctor or nurse where and how much you hurt so your pain can be treated.   Special Considerations -If you are diabetic, you may be placed on insulin after surgery to have closer control over your blood sugars to promote healing and recovery.  This does not mean that you will be discharged on insulin.  If applicable, your oral antidiabetics will be resumed when you are tolerating  a solid diet.   -Your final pathology results from surgery should be available around one week after surgery and the results will be relayed to you when available.   -Dr. Lahoma Crocker is the surgeon that assists your GYN Oncologist with surgery.  If you end up staying the night, the next day after your surgery you will either see Dr. Berline Lopes or Dr. Lahoma Crocker.   -FMLA forms can be faxed to [phone] and please allow 5-7 business days for completion.   Pain Management After Surgery -You have been prescribed your pain medication and bowel regimen  medications before surgery so that you can have these available when you are discharged from the hospital. The pain medication is for use ONLY AFTER surgery and a new prescription will not be given.    -Make sure that you have Tylenol and Ibuprofen IF YOU ARE ABLE TO TAKE THESE MEDICATIONS at home to use on a regular basis after surgery for pain control. We recommend alternating the medications every hour to six hours since they work differently and are processed in the body differently for pain relief.   -Review the attached handout on narcotic use and their risks and side effects.    Bowel Regimen -You have been prescribed Sennakot-S to take nightly to prevent constipation especially if you are taking the narcotic pain medication intermittently.  It is important to prevent constipation and drink adequate amounts of liquids. You can stop taking this medication when you are not taking pain medication and you are back on your normal bowel routine.   Risks of Surgery Risks of surgery are low but include bleeding, infection, damage to surrounding structures, re-operation, blood clots, and very rarely death.     Blood Transfusion Information (For the consent to be signed before surgery)   We will be checking your blood type before surgery so in case of emergencies, we will know what type of blood you would need.                                             WHAT IS A BLOOD TRANSFUSION?   A transfusion is the replacement of blood or some of its parts. Blood is made up of multiple cells which provide different functions. Red blood cells carry oxygen and are used for blood loss replacement. White blood cells fight against infection. Platelets control bleeding. Plasma helps clot blood. Other blood products are available for specialized needs, such as hemophilia or other clotting disorders. BEFORE THE TRANSFUSION  Who gives blood for transfusions?  You may be able to donate blood to be used at a  later date on yourself (autologous donation). Relatives can be asked to donate blood. This is generally not any safer than if you have received blood from a stranger. The same precautions are taken to ensure safety when a relative's blood is donated. Healthy volunteers who are fully evaluated to make sure their blood is safe. This is blood bank blood. Transfusion therapy is the safest it has ever been in the practice of medicine. Before blood is taken from a donor, a complete history is taken to make sure that person has no history of diseases nor engages in risky social behavior (examples are intravenous drug use or sexual activity with multiple partners). The donor's travel history is screened to minimize risk of transmitting infections, such as malaria. The  donated blood is tested for signs of infectious diseases, such as HIV and hepatitis. The blood is then tested to be sure it is compatible with you in order to minimize the chance of a transfusion reaction. If you or a relative donates blood, this is often done in anticipation of surgery and is not appropriate for emergency situations. It takes many days to process the donated blood. RISKS AND COMPLICATIONS Although transfusion therapy is very safe and saves many lives, the main dangers of transfusion include:  Getting an infectious disease. Developing a transfusion reaction. This is an allergic reaction to something in the blood you were given. Every precaution is taken to prevent this. The decision to have a blood transfusion has been considered carefully by your caregiver before blood is given. Blood is not given unless the benefits outweigh the risks.   AFTER SURGERY INSTRUCTIONS   Return to work: 4-6 weeks if applicable   Activity: 1. Be up and out of the bed during the day.  Take a nap if needed.  You may walk up steps but be careful and use the hand rail.  Stair climbing will tire you more than you think, you may need to stop part way and  rest.    2. No lifting or straining for 6 weeks over 10 pounds. No pushing, pulling, straining for 6 weeks.   3. No driving for around 1 week(s).  Do not drive if you are taking narcotic pain medicine and make sure that your reaction time has returned.    4. You can shower as soon as the next day after surgery. Shower daily.  Use your regular soap and water (not directly on the incision) and pat your incision(s) dry afterwards; don't rub.  No tub baths or submerging your body in water until cleared by your surgeon. If you have the soap that was given to you by pre-surgical testing that was used before surgery, you do not need to use it afterwards because this can irritate your incisions.    5. No sexual activity and nothing in the vagina for 4 weeks.   6. You may experience a small amount of clear drainage from your incisions, which is normal.  If the drainage persists, increases, or changes color please call the office.   7. Do not use creams, lotions, or ointments such as neosporin on your incisions after surgery until advised by your surgeon because they can cause removal of the dermabond glue on your incisions.     8. Take Tylenol or ibuprofen first for pain if you are able to take these medications and only use narcotic pain medication for severe pain not relieved by the Tylenol or Ibuprofen.  Monitor your Tylenol intake to a max of 4,000 mg in a 24 hour period. You can alternate these medications after surgery.   Diet: 1. Low sodium Heart Healthy Diet is recommended but you are cleared to resume your normal (before surgery) diet after your procedure.   2. It is safe to use a laxative, such as Miralax or Colace, if you have difficulty moving your bowels. You have been prescribed Sennakot-S to take at bedtime every evening after surgery to keep bowel movements regular and to prevent constipation.     Wound Care: 1. Keep clean and dry.  Shower daily.   Reasons to call the Doctor: Fever -  Oral temperature greater than 100.4 degrees Fahrenheit Foul-smelling vaginal discharge Difficulty urinating Nausea and vomiting Increased pain at the site of  the incision that is unrelieved with pain medicine. Difficulty breathing with or without chest pain New calf pain especially if only on one side Sudden, continuing increased vaginal bleeding with or without clots.   Contacts: For questions or concerns you should contact:   Dr. Jeral Pinch at [phone]   Joylene John, NP at [phone]   After Hours: call [phone] and have the GYN Oncologist paged/contacted (after 5 pm or on the weekends).   Messages sent via mychart are for non-urgent matters and are not responded to after hours so for urgent needs, please call the after hours number.

## 2022-07-12 NOTE — Progress Notes (Unsigned)
GYNECOLOGIC ONCOLOGY NEW PATIENT CONSULTATION   Patient Name: Debra [name]  Patient Age: 76 y.o. Date of Service: 07/12/22 Referring Provider: Otis Peak, MD  Primary Care Provider: Lafonda Mosses, MD Consulting Provider: Jeral Pinch, MD   Assessment/Plan:  Postmenopausal patient with complex adnexal mass, elevated tumor markers.  Discussed with the patient and her granddaughter findings from recent ultrasound imaging showing a complex adnexal mass, likely arising from the right ovary.  This was first identified approximately 1 year ago on ultrasound.  There has been a small increase in size of the lesion over the last year (approximately 1 cm) and some ultrasound features that raise the concern for possible malignancy.  Tumor markers were recently performed and HE4 and ROMA are both elevated.  Luckily, the patient is overall relatively asymptomatic although has had some mild, intermittent abdominal symptoms.  In the setting of her exam today and her symptoms, I recommend that we proceed with CT scan prior to surgery to evaluate for evidence of metastatic disease.  I recommended that we proceed with diagnostic surgery via a minimally invasive approach.  Plan would be for robotic bilateral salpingo-oophorectomy.  The patient has already undergone total hysterectomy.  Plan would be to place the adnexal mass in an Endo Catch bag for contained cyst drainage.  The mass would then be sent for frozen section.  If benign, no additional procedures would be performed.  In the setting of a borderline tumor, we discussed recommendation for additional staging procedures including omentectomy and peritoneal biopsies.  If malignancy was identified, then lymphadenectomy would also be performed.  We discussed the plan for a robotic assisted bilateral salpingo-oophorectomy, possible staging including lymph node dissection, possible laparotomy. The risks of surgery were discussed in detail and she  understands these to include infection; wound separation; hernia; vaginal cuff separation, injury to adjacent organs such as bowel, bladder, blood vessels, ureters and nerves; bleeding which may require blood transfusion; anesthesia risk; thromboembolic events; possible death; unforeseen complications; possible need for re-exploration; medical complications such as heart attack, stroke, pleural effusion and pneumonia; and, if full lymphadenectomy is performed the risk of lymphedema and lymphocyst. The patient will receive DVT and antibiotic prophylaxis as indicated. She voiced a clear understanding. She had the opportunity to ask questions. Perioperative instructions were reviewed with her. Prescriptions for post-op medications were sent to her pharmacy of choice.  We will plan to get a CEA with her preoperative labs.  Given her pulmonary disease, we will obtain pulmonary clearance.  A copy of this note was sent to the patient's referring provider.   65 minutes of total time was spent for this patient encounter, including preparation, face-to-face counseling with the patient and coordination of care, and documentation of the encounter.   Jeral Pinch, MD  Division of Gynecologic Oncology  Department of Obstetrics and Gynecology  [facility]  ___________________________________________  Chief Complaint: Chief Complaint  Patient presents with   Cyst of ovary, unspecified laterality   Elevated tumor markers    History of Present Illness:  Debra [name] is a 76 y.o. y.o. female who is seen in consultation at the request of Dr. Nelda Marseille for an evaluation of a complex adnexal cyst, elevated tumor markers.  In 07/2021, patient underwent pelvic ultrasound exam after renal ultrasound showed a right ovarian cyst.  On transabdominal images, right ovary measured 6.1 x 4.4 x 5 cm.  Cystic lesion was identified measuring up to 4.7 cm containing a few scattered internal echoes,  node  definitive mural nodularity or septations.  Follow-up pelvic ultrasound, again with transabdominal images only, in 02/2022, showed mildly complex cyst within the right ovary measuring 4.4 x 3.7 x 3.9 cm containing scattered low-level internal echoes.  A single wall shows questionable irregularity versus mural nodularity.  Repeat ultrasound on 06/17/2022 shows right cystic lesion now measures 5.7 x 4.6 x 4.4 centimeters, smooth walled, multiloculated cyst with internal echoes and minimal flow.  No ascites noted.  Appearing left ovary.  CA-125 was 34.8, HE4 was elevated at 158 and Postmenopausal ROMA elevated at 4.44.   The patient presents with her granddaughter today.  She notes overall doing well.  She has noticed some increasing abdominal girth over the last couple of months.  Endorses a good appetite without nausea or emesis.  Denies any early satiety or bloating.  Over the weekend, she she visited her sister and had some "queasiness" although describes this as the queasiness that does not feel like you are going to throw up.  She endorses regular bowel and bladder function.  She denies any pelvic or abdominal pain.  Patient has COPD and asthma.  Follows with a pulmonologist in [place].  Uses her rescue inhaler approximately 1 time a week.  Surgical history is notable for total hysterectomy performed in 1989 for fibroid uterus and heavy bleeding.  She was told that at least both ovaries were left in situ.  Is unsure whether her fallopian tubes were left.  PAST MEDICAL HISTORY:  Past Medical History:  Diagnosis Date   Allergic rhinitis    Arthritis    Asthma    Chronic kidney disease (CKD)    COPD (chronic obstructive pulmonary disease) ([place])    Diabetes mellitus without complication ([place])    Hyperlipidemia    Hypertension    Hypertensive chronic kidney disease    Recurrent upper respiratory infection (URI)    Secondary hyperparathyroidism of renal origin ([place])      PAST  SURGICAL HISTORY:  Past Surgical History:  Procedure Laterality Date   CHOLECYSTECTOMY     CORONARY ARTERY BYPASS GRAFT     PARTIAL HYSTERECTOMY  1981   ovaries left in situ, was done for fibroids    OB/GYN HISTORY:  OB History  Gravida Para Term Preterm AB Living  '5 4 3 1 1 4  '$ SAB IAB Ectopic Multiple Live Births  1       4    # Outcome Date GA Lbr Len/2nd Weight Sex Delivery Anes PTL Lv  5 SAB           4 Preterm     M Vag-Spont   LIV  3 Term     M Vag-Spont   LIV  2 Term     F Vag-Spont   LIV  1 Term     F Vag-Spont   LIV    No LMP recorded. Patient is postmenopausal.  Age at menarche: 61 Age at menopause:  Stopped in 1989 at the time of her hysterectomy, had menopausal symptoms starting in her 30s. Hx of HRT: Denies Hx of STDs: Denies Last pap: Unsure History of abnormal pap smears: Denies  SCREENING STUDIES:  Last mammogram: 2022  Last colonoscopy: 2021  MEDICATIONS: Outpatient Encounter Medications as of 07/12/2022  Medication Sig   Acetaminophen-guaiFENesin (MUCINEX COLD & FLU) 325-200 MG CAPS Take by mouth.   amLODipine (NORVASC) 5 MG tablet Take 5 mg by mouth daily.   aspirin 81 MG tablet Take 81 mg by mouth daily.  atorvastatin (LIPITOR) 80 MG tablet Take 80 mg by mouth daily.   azithromycin (ZITHROMAX) 250 MG tablet Take by mouth.   Benralizumab (FASENRA) 30 MG/ML SOSY Inject into the skin.   Budeson-Glycopyrrol-Formoterol (BREZTRI AEROSPHERE) 160-9-4.8 MCG/ACT AERO Inhale 2 puffs into the lungs 2 (two) times daily.   calcium citrate-vitamin D (CITRACAL+D) 315-200 MG-UNIT tablet Take 1 tablet by mouth 2 (two) times daily.   cefdinir (OMNICEF) 300 MG capsule Take 300 mg by mouth every 12 (twelve) hours.   citalopram (CELEXA) 20 MG tablet Take 20 mg by mouth daily.   fexofenadine (ALLEGRA) 180 MG tablet TAKE 1 TABLET (180 MG TOTAL) BY MOUTH 2 (TWO) TIMES DAILY AS NEEDED FOR ALLERGIES OR RHINITIS.   fluticasone (FLONASE) 50 MCG/ACT nasal spray Place into  both nostrils daily.   furosemide (LASIX) 20 MG tablet Take 20 mg by mouth daily as needed.   gabapentin (NEURONTIN) 400 MG capsule Take 400 mg by mouth 3 (three) times daily.   hydrochlorothiazide (HYDRODIURIL) 12.5 MG tablet Take 12.5 mg by mouth daily.   ipratropium-albuterol (DUONEB) 0.5-2.5 (3) MG/3ML SOLN USE 3ML IN NEB EVERY 4 HOURS AS NEEDED   metoprolol (LOPRESSOR) 100 MG tablet    olmesartan (BENICAR) 20 MG tablet TAKE 1 TABLET BY MOUTH EVERY DAY   predniSONE (DELTASONE) 20 MG tablet Take 40 mg by mouth daily.   senna-docusate (SENOKOT-S) 8.6-50 MG tablet Take 2 tablets by mouth at bedtime. For AFTER surgery, do not take if having diarrhea   traMADol (ULTRAM) 50 MG tablet Take 1 tablet (50 mg total) by mouth every 6 (six) hours as needed for severe pain. For AFTER surgery only, do not take and drive   VENTOLIN HFA 108 (90 Base) MCG/ACT inhaler Inhale 2 puffs into the lungs every 4 (four) hours as needed for wheezing or shortness of breath.   vitamin C (ASCORBIC ACID) 500 MG tablet Take 500 mg by mouth daily.   Vitamin D, Ergocalciferol, (DRISDOL) 1.25 MG (50000 UT) CAPS capsule TAKE 1 CAPSULE BY MOUTH MONTHLY   [DISCONTINUED] co-enzyme Q-10 30 MG capsule Take 30 mg by mouth 3 (three) times daily.   [DISCONTINUED] montelukast (SINGULAIR) 10 MG tablet TAKE 1 TABLET BY MOUTH EVERY DAY   [DISCONTINUED] Diclofenac Sodium (VOLTAREN OP) Apply to eye.   [DISCONTINUED] nystatin (MYCOSTATIN) 100000 UNIT/ML suspension PLEASE SEE ATTACHED FOR DETAILED DIRECTIONS (Patient not taking: Reported on 06/17/2022)   [DISCONTINUED] Triamcinolone Acetonide 0.025 % LOTN Apply topically. (Patient not taking: Reported on 06/17/2022)   Facility-Administered Encounter Medications as of 07/12/2022  Medication   Benralizumab SOSY 30 mg    ALLERGIES:  Allergies  Allergen Reactions   Aspirin Other (See Comments)    GI upset and burns per patient.      FAMILY HISTORY:  Family History  Problem Relation Age of  Onset   Stroke Mother    Cancer Father    Prostate cancer Father    Alcohol abuse Sister    Heart attack Brother    Alcohol abuse Brother    Other Brother        MVA   Asthma Daughter    Miscarriages / [place] Daughter    Diabetes Son    Allergic rhinitis Neg Hx    Eczema Neg Hx    Immunodeficiency Neg Hx    Urticaria Neg Hx    Angioedema Neg Hx    Atopy Neg Hx      SOCIAL HISTORY:  Social Connections: Moderately Isolated (04/08/2022)   Social Connection and  Isolation Panel [NHANES]    Frequency of Communication with Friends and Family: More than three times a week    Frequency of Social Gatherings with Friends and Family: Three times a week    Attends Religious Services: Never    Active Member of Clubs or Organizations: No    Attends Music therapist: Never    Marital Status: Married    REVIEW OF SYSTEMS:  + Shortness of breath, wheezing, cough, leg swelling, hot flashes, back pain, muscle cramps/pain, problem walking, headache. Denies appetite changes, fevers, chills, fatigue, unexplained weight changes. Denies hearing loss, neck lumps or masses, mouth sores, ringing in ears or voice changes. Denies chest pain or palpitations.  Denies abdominal distention, pain, blood in stools, constipation, diarrhea, nausea, vomiting, or early satiety. Denies pain with intercourse, dysuria, frequency, hematuria or incontinence. Denies pelvic pain, vaginal bleeding or vaginal discharge.   Denies joint pain. Denies itching, rash, or wounds. Denies dizziness, numbness or seizures. Denies swollen lymph nodes or glands, denies easy bruising or bleeding. Denies anxiety, depression, confusion, or decreased concentration.  Physical Exam:  Vital Signs for this encounter:  Blood pressure 137/72, pulse (!) 59, temperature 98.5 F (36.9 C), temperature source Oral, resp. rate 16, height '5\' 2"'$  (1.575 m), weight 172 lb (78 kg), SpO2 96 %. Body mass index is 31.46  kg/m. General: Alert, oriented, no acute distress.  HEENT: Normocephalic, atraumatic. Sclera anicteric.  Chest: Mildly decreased breath sounds at lung bases, expiratory wheezes appreciated bilaterally. Cardiovascular: Regular rate and rhythm, no murmurs, rubs, or gallops.  Abdomen: Obese. Normoactive bowel sounds. Soft, some mild distention in the upper abdomen, nontender to palpation. No masses or hepatosplenomegaly appreciated. No palpable fluid wave.  Extremities: Grossly normal range of motion. Warm, well perfused. 1+ edema bilaterally.  Skin: No rashes or lesions.  Lymphatics: No cervical, supraclavicular, or inguinal adenopathy.  GU:  Normal external female genitalia. No lesions. No discharge or bleeding.             Bladder/urethra:  No lesions or masses.             Vagina: Mildly atrophic, with no lesions or masses noted.             Cervix/uterus: Surgically absent.  Cuff intact.             Adnexa: Inferior aspect of mass appreciated along the right pelvic sidewall, minimally mobile, smooth, no nodularity appreciated.  Rectal: Deferred.  LABORATORY AND RADIOLOGIC DATA:  Outside medical records were reviewed to synthesize the above history, along with the history and physical obtained during the visit.   Lab Results  Component Value Date   WBC 16.2 (H) 08/15/2019   HGB 12.4 08/15/2019   HCT 36.1 08/15/2019   PLT 325 12/19/2017   GLUCOSE 103 (H) 07/12/2022   ALT 15 07/12/2022   AST 18 07/12/2022   NA 143 07/12/2022   K 4.2 07/12/2022   CL 104 07/12/2022   CREATININE 1.36 (H) 07/12/2022   BUN 18 07/12/2022   CO2 34 (H) 07/12/2022

## 2022-07-13 ENCOUNTER — Telehealth: Payer: Self-pay | Admitting: *Deleted

## 2022-07-13 NOTE — Patient Instructions (Signed)
DUE TO COVID-19 ONLY TWO VISITORS  (aged 74 and older)  ARE ALLOWED TO COME WITH YOU AND STAY IN THE WAITING ROOM ONLY DURING PRE OP AND PROCEDURE.   **NO VISITORS ARE ALLOWED IN THE SHORT STAY AREA OR RECOVERY ROOM!!**  IF YOU WILL BE ADMITTED INTO THE HOSPITAL YOU ARE ALLOWED ONLY FOUR SUPPORT PEOPLE DURING VISITATION HOURS ONLY (7 AM -8PM)   The support person(s) must pass our screening, gel in and out, and wear a mask at all times, including in the patient's room. Patients must also wear a mask when staff or their support person are in the room. Visitors GUEST BADGE MUST BE WORN VISIBLY  One adult visitor may remain with you overnight and MUST be in the room by 8 P.M.     Your procedure is scheduled on: 07/27/22   Report to [facility] Main Entrance    Report to admitting at : 11:15 AM   Call this number if you have problems the morning of surgery [phone]   Do not eat food :After Midnight.   After Midnight you may have the following liquids until : 10:30 AM DAY OF SURGERY  Water Black Coffee (sugar ok, NO MILK/CREAM OR CREAMERS)  Tea (sugar ok, NO MILK/CREAM OR CREAMERS) regular and decaf                             Plain Jell-O (NO RED)                                           Fruit ices (not with fruit pulp, NO RED)                                     Popsicles (NO RED)                                                                  Juice: apple, WHITE grape, WHITE cranberry Sports drinks like Gatorade (NO RED)    Oral Hygiene is also important to reduce your risk of infection.                                    Remember - BRUSH YOUR TEETH THE MORNING OF SURGERY WITH YOUR REGULAR TOOTHPASTE   Do NOT smoke after Midnight   Take these medicines the morning of surgery with A SIP OF WATER: Neurontin,citalopram,allegra,metoprolol,amlodipine. Use inhalers as usual.Tylenol as needed.  DO NOT TAKE ANY ORAL DIABETIC MEDICATIONS DAY OF YOUR SURGERY  Bring CPAP mask  and tubing day of surgery.                              You may not have any metal on your body including hair pins, jewelry, and body piercing             Do not wear make-up, lotions, powders, perfumes/cologne, or deodorant  Do  not wear nail polish including gel and S&S, artificial/acrylic nails, or any other type of covering on natural nails including finger and toenails. If you have artificial nails, gel coating, etc. that needs to be removed by a nail salon please have this removed prior to surgery or surgery may need to be canceled/ delayed if the surgeon/ anesthesia feels like they are unable to be safely monitored.   Do not shave  48 hours prior to surgery.    Do not bring valuables to the hospital. [place].   Contacts, dentures or bridgework may not be worn into surgery.   Bring small overnight bag day of surgery.   DO NOT [place]. PHARMACY WILL DISPENSE MEDICATIONS LISTED ON YOUR MEDICATION LIST TO YOU DURING YOUR ADMISSION [place]!    Patients discharged on the day of surgery will not be allowed to drive home.  Someone NEEDS to stay with you for the first 24 hours after anesthesia.   Special Instructions: Bring a copy of your healthcare power of attorney and living will documents         the day of surgery if you haven't scanned them before.              Please read over the following fact sheets you were given: IF YOU HAVE QUESTIONS ABOUT YOUR PRE-OP INSTRUCTIONS PLEASE CALL [phone]     [facility] - Preparing for Surgery Before surgery, you can play an important role.  Because skin is not sterile, your skin needs to be as free of germs as possible.  You can reduce the number of germs on your skin by washing with CHG (chlorahexidine gluconate) soap before surgery.  CHG is an antiseptic cleaner which kills germs and bonds with the skin to continue killing germs even after  washing. Please DO NOT use if you have an allergy to CHG or antibacterial soaps.  If your skin becomes reddened/irritated stop using the CHG and inform your nurse when you arrive at Short Stay. Do not shave (including legs and underarms) for at least 48 hours prior to the first CHG shower.  You may shave your face/neck. Please follow these instructions carefully:  1.  Shower with CHG Soap the night before surgery and the  morning of Surgery.  2.  If you choose to wash your hair, wash your hair first as usual with your  normal  shampoo.  3.  After you shampoo, rinse your hair and body thoroughly to remove the  shampoo.                           4.  Use CHG as you would any other liquid soap.  You can apply chg directly  to the skin and wash                       Gently with a scrungie or clean washcloth.  5.  Apply the CHG Soap to your body ONLY FROM THE NECK DOWN.   Do not use on face/ open                           Wound or open sores. Avoid contact with eyes, ears mouth and genitals (private parts).  Wash face,  Genitals (private parts) with your normal soap.             6.  Wash thoroughly, paying special attention to the area where your surgery  will be performed.  7.  Thoroughly rinse your body with warm water from the neck down.  8.  DO NOT shower/wash with your normal soap after using and rinsing off  the CHG Soap.                9.  Pat yourself dry with a clean towel.            10.  Wear clean pajamas.            11.  Place clean sheets on your bed the night of your first shower and do not  sleep with pets. Day of Surgery : Do not apply any lotions/deodorants the morning of surgery.  Please wear clean clothes to the hospital/surgery center.  FAILURE TO FOLLOW THESE INSTRUCTIONS MAY RESULT IN THE CANCELLATION OF YOUR SURGERY PATIENT SIGNATURE_________________________________  NURSE  SIGNATURE__________________________________  ________________________________________________________________________   WHAT IS A BLOOD TRANSFUSION? Blood Transfusion Information  A transfusion is the replacement of blood or some of its parts. Blood is made up of multiple cells which provide different functions. Red blood cells carry oxygen and are used for blood loss replacement. White blood cells fight against infection. Platelets control bleeding. Plasma helps clot blood. Other blood products are available for specialized needs, such as hemophilia or other clotting disorders. BEFORE THE TRANSFUSION  Who gives blood for transfusions?  Healthy volunteers who are fully evaluated to make sure their blood is safe. This is blood bank blood. Transfusion therapy is the safest it has ever been in the practice of medicine. Before blood is taken from a donor, a complete history is taken to make sure that person has no history of diseases nor engages in risky social behavior (examples are intravenous drug use or sexual activity with multiple partners). The donor's travel history is screened to minimize risk of transmitting infections, such as malaria. The donated blood is tested for signs of infectious diseases, such as HIV and hepatitis. The blood is then tested to be sure it is compatible with you in order to minimize the chance of a transfusion reaction. If you or a relative donates blood, this is often done in anticipation of surgery and is not appropriate for emergency situations. It takes many days to process the donated blood. RISKS AND COMPLICATIONS Although transfusion therapy is very safe and saves many lives, the main dangers of transfusion include:  Getting an infectious disease. Developing a transfusion reaction. This is an allergic reaction to something in the blood you were given. Every precaution is taken to prevent this. The decision to have a blood transfusion has been considered  carefully by your caregiver before blood is given. Blood is not given unless the benefits outweigh the risks. AFTER THE TRANSFUSION Right after receiving a blood transfusion, you will usually feel much better and more energetic. This is especially true if your red blood cells have gotten low (anemic). The transfusion raises the level of the red blood cells which carry oxygen, and this usually causes an energy increase. The nurse administering the transfusion will monitor you carefully for complications. HOME CARE INSTRUCTIONS  No special instructions are needed after a transfusion. You may find your energy is better. Speak with your caregiver about any limitations on activity for underlying diseases you may have.  SEEK MEDICAL CARE IF:  Your condition is not improving after your transfusion. You develop redness or irritation at the intravenous (IV) site. SEEK IMMEDIATE MEDICAL CARE IF:  Any of the following symptoms occur over the next 12 hours: Shaking chills. You have a temperature by mouth above 102 F (38.9 C), not controlled by medicine. Chest, back, or muscle pain. People around you feel you are not acting correctly or are confused. Shortness of breath or difficulty breathing. Dizziness and fainting. You get a rash or develop hives. You have a decrease in urine output. Your urine turns a dark color or changes to pink, red, or brown. Any of the following symptoms occur over the next 10 days: You have a temperature by mouth above 102 F (38.9 C), not controlled by medicine. Shortness of breath. Weakness after normal activity. The white part of the eye turns yellow (jaundice). You have a decrease in the amount of urine or are urinating less often. Your urine turns a dark color or changes to pink, red, or brown. Document Released: 12/03/2000 Document Revised: 02/28/2012 Document Reviewed: 07/22/2008 [facility] Patient Information 2014 [place],  [place].  _______________________________________________________________________

## 2022-07-13 NOTE — Telephone Encounter (Signed)
Per Dr Berline Lopes fax office note and surgical optimization form to the patient's PCP and pulmonary office

## 2022-07-14 ENCOUNTER — Other Ambulatory Visit: Payer: Self-pay

## 2022-07-14 ENCOUNTER — Encounter (HOSPITAL_COMMUNITY)
Admission: RE | Admit: 2022-07-14 | Discharge: 2022-07-14 | Disposition: A | Discharge status: Home / Self Care | Payer: Medicare PPO | Source: Ambulatory Visit | Attending: Gynecologic Oncology | Admitting: Gynecologic Oncology

## 2022-07-14 ENCOUNTER — Encounter (HOSPITAL_COMMUNITY): Payer: Self-pay | Admitting: *Deleted

## 2022-07-14 VITALS — BP 141/67 | HR 55 | Temp 98.1°F | Ht 62.0 in | Wt 171.0 lb

## 2022-07-14 DIAGNOSIS — N83209 Unspecified ovarian cyst, unspecified side: Secondary | ICD-10-CM | POA: Diagnosis not present

## 2022-07-14 DIAGNOSIS — Z01818 Encounter for other preprocedural examination: Secondary | ICD-10-CM | POA: Insufficient documentation

## 2022-07-14 DIAGNOSIS — R7303 Prediabetes: Secondary | ICD-10-CM | POA: Diagnosis not present

## 2022-07-14 DIAGNOSIS — I10 Essential (primary) hypertension: Secondary | ICD-10-CM | POA: Diagnosis not present

## 2022-07-14 HISTORY — DX: Dyspnea, unspecified: R06.00

## 2022-07-14 LAB — BASIC METABOLIC PANEL
Anion gap: 8 (ref 5–15)
BUN: 30 mg/dL — ABNORMAL HIGH (ref 8–23)
CO2: 30 mmol/L (ref 22–32)
Calcium: 9.5 mg/dL (ref 8.9–10.3)
Chloride: 106 mmol/L (ref 98–111)
Creatinine, Ser: 1.52 mg/dL — ABNORMAL HIGH (ref 0.44–1.00)
GFR, Estimated: 36 mL/min — ABNORMAL LOW (ref >60)
Glucose, Bld: 101 mg/dL — ABNORMAL HIGH (ref 70–99)
Potassium: 4.4 mmol/L (ref 3.5–5.1)
Sodium: 144 mmol/L (ref 135–145)

## 2022-07-14 LAB — TYPE AND SCREEN
ABO/RH(D): B POS
Antibody Screen: NEGATIVE

## 2022-07-14 LAB — CBC
HCT: 38.6 % (ref 36.0–46.0)
Hemoglobin: 12.4 g/dL (ref 12.0–15.0)
MCH: 29.9 pg (ref 26.0–34.0)
MCHC: 32.1 g/dL (ref 30.0–36.0)
MCV: 93 fL (ref 80.0–100.0)
Platelets: 229 10*3/uL (ref 150–400)
RBC: 4.15 MIL/uL (ref 3.87–5.11)
RDW: 15 % (ref 11.5–15.5)
WBC: 8.3 10*3/uL (ref 4.0–10.5)
nRBC: 0 % (ref 0.0–0.2)

## 2022-07-14 LAB — HEMOGLOBIN A1C
Hgb A1c MFr Bld: 6.3 % — ABNORMAL HIGH (ref 4.8–5.6)
Mean Plasma Glucose: 134.11 mg/dL

## 2022-07-14 LAB — GLUCOSE, CAPILLARY: Glucose-Capillary: 106 mg/dL — ABNORMAL HIGH (ref 70–99)

## 2022-07-14 NOTE — Progress Notes (Addendum)
For Short Stay: [place] appointment date: Date of COVID positive in last 48 days:  Bowel Prep reminder:   For Anesthesia: PCP - DR. GINA COLLINS: [place]: [place]. Cardiologist - Dr. Lessie Dings: [facility]: [place]. : Records in the chart.  Chest x-ray -  EKG -  Stress Test -  ECHO -  Cardiac Cath -  Pacemaker/ICD device last checked: Pacemaker orders received: Device Rep notified:  Spinal Cord Stimulator:  Sleep Study -  CPAP -   Fasting Blood Sugar - N/A Checks Blood Sugar _____ times a day Date and result of last Hgb A1c-  Blood Thinner Instructions: Aspirin Instructions: To hold it day before surgery: Dr. Berline Lopes. Last Dose:  Activity level: Can go up a flight of stairs and activities of daily living without stopping and without chest pain and/or shortness of breath   Able to exercise without chest pain and/or shortness of breath    Pt. Is Unable to go up a flight of stairs without chest pain and/or shortness of breath    Anesthesia review:  Hx: HTN,CAD,CABG x 2,CKD III,DIA,SOB with exertion  Patient denies shortness of breath, fever, cough and chest pain at PAT appointment   Patient verbalized understanding of instructions that were given to them at the PAT appointment. Patient was also instructed that they will need to review over the PAT instructions again at home before surgery.

## 2022-07-16 ENCOUNTER — Telehealth: Payer: Self-pay | Admitting: Pulmonary Disease

## 2022-07-16 NOTE — Progress Notes (Signed)
Anesthesia Chart Review   Case: [id number] Date/Time: 07/27/22 1315   Procedure: XI ROBOTIC ASSISTED BILATERAL SALPINGO OOPHORECTOMY,POSSIBLE STAGING, POSSIBLE LAPAROTOMY (Bilateral)   Anesthesia type: General   Pre-op diagnosis: COMPLEX ADNEXAL CYST,ELEVATED TUMOR MARKERS   Location: [facility] ROOM 05 / [facility]   Surgeons: Lafonda Mosses, MD       DISCUSSION:76 y.o. former smoker with h/o HTN, asthma, DM II, CKD Stage 3 followed by [facility] baseline 1.4-1.5, COPD, secondary hyperparathyroidism, complex adnexal cyst scheduled for above procedure 07/27/2022 with Dr. Jeral Pinch.   Pt last seen by pulmonologist 06/11/2022.  Per Dr. Melvyn Novas cleared for surgery.   Anticipate pt can proceed with planned procedure barring acute status change.   VS: BP (!) 141/67   Pulse (!) 55   Temp 36.7 C (Oral)   Ht '5\' 2"'$  (1.575 m)   Wt 77.6 kg   SpO2 96%   BMI 31.28 kg/m   PROVIDERS: Coolidge Breeze, FNP is PCP   Christinia Gully, MD is Pulmonologist  LABS: Labs reviewed: Acceptable for surgery. (all labs ordered are listed, but only abnormal results are displayed)  Labs Reviewed  HEMOGLOBIN A1C - Abnormal; Notable for the following components:      Result Value   Hgb A1c MFr Bld 6.3 (*)    All other components within normal limits  BASIC METABOLIC PANEL - Abnormal; Notable for the following components:   Glucose, Bld 101 (*)    BUN 30 (*)    Creatinine, Ser 1.52 (*)    GFR, Estimated 36 (*)    All other components within normal limits  GLUCOSE, CAPILLARY - Abnormal; Notable for the following components:   Glucose-Capillary 106 (*)    All other components within normal limits  CBC  TYPE AND SCREEN     IMAGES:   EKG: 07/14/2022 Rate 51 bpm  Sinus bradycardia Left axis deviation Right bundle branch block Abnormal ECG When compared with ECG of 30-Oct-2021 14:14, No significant change was found  CV:  Past Medical History:  Diagnosis Date   Allergic rhinitis     Arthritis    Asthma    Chronic kidney disease (CKD)    COPD (chronic obstructive pulmonary disease) (HCC)    Diabetes mellitus without complication (HCC)    Dyspnea    Hyperlipidemia    Hypertension    Hypertensive chronic kidney disease    Recurrent upper respiratory infection (URI)    Secondary hyperparathyroidism of renal origin ([place])     Past Surgical History:  Procedure Laterality Date   CHOLECYSTECTOMY     CORONARY ARTERY BYPASS GRAFT     PARTIAL HYSTERECTOMY  1981   ovaries left in situ, was done for fibroids    MEDICATIONS:  acetaminophen (TYLENOL) 325 MG tablet   albuterol (PROVENTIL) (2.5 MG/3ML) 0.083% nebulizer solution   amLODipine (NORVASC) 5 MG tablet   aspirin 81 MG tablet   atorvastatin (LIPITOR) 80 MG tablet   Benralizumab (FASENRA) 30 MG/ML SOSY   Budeson-Glycopyrrol-Formoterol (BREZTRI AEROSPHERE) 160-9-4.8 MCG/ACT AERO   Calcium Carb-Cholecalciferol (CALCIUM 600+D3 PO)   Chlorphen-Phenyleph-APAP (SINUS CONGESTION/PAIN NIGHT) 2-5-325 MG TABS   ciclopirox (LOPROX) 0.77 % cream   citalopram (CELEXA) 20 MG tablet   dextromethorphan-guaiFENesin (MUCINEX DM) 30-600 MG 12hr tablet   dextromethorphan-guaiFENesin (TUSSIN DM) 10-100 MG/5ML liquid   EPINEPHrine (EPIPEN 2-PAK) 0.3 mg/0.3 mL IJ SOAJ injection   fexofenadine (ALLEGRA) 60 MG tablet   fluticasone (FLONASE) 50 MCG/ACT nasal spray   furosemide (LASIX) 20 MG tablet  gabapentin (NEURONTIN) 400 MG capsule   hydrochlorothiazide (HYDRODIURIL) 12.5 MG tablet   ipratropium (ATROVENT) 0.06 % nasal spray   ipratropium-albuterol (DUONEB) 0.5-2.5 (3) MG/3ML SOLN   loratadine (CLARITIN) 10 MG tablet   metoprolol (LOPRESSOR) 100 MG tablet   mometasone (NASONEX) 50 MCG/ACT nasal spray   montelukast (SINGULAIR) 10 MG tablet   Multiple Vitamins-Minerals (MULTIVITAMIN WITH MINERALS) tablet   olmesartan (BENICAR) 20 MG tablet   Omega-3 Fatty Acids (FISH OIL) 1000 MG CAPS   Phenyleph-Doxylamine-DM-APAP  (ALKA-SELTZER PLUS DAY/NIGHT PO)   Phenylephrine-DM-GG (MUCINEX FAST-MAX CONGEST COUGH) 2.5-5-100 MG/5ML LIQD   senna-docusate (SENOKOT-S) 8.6-50 MG tablet   sodium chloride (OCEAN) 0.65 % SOLN nasal spray   traMADol (ULTRAM) 50 MG tablet   VENTOLIN HFA 108 (90 Base) MCG/ACT inhaler   Vitamin D, Ergocalciferol, (DRISDOL) 1.25 MG (50000 UT) CAPS capsule   zinc gluconate 50 MG tablet    Benralizumab SOSY 30 mg    [facility], PA-C [facility] Pre-Surgical Testing [phone]

## 2022-07-16 NOTE — Telephone Encounter (Signed)
Fax received from Dr. Lorenza Chick with [facility] to perform a ROBOTIC ASSISTED LAPAROSCOPIC BILATERAL SALPINGO OOPHORECTOMY  on patient.  Patient needs surgery clearance. Surgery is 07/27/2022. Patient was seen on 06/11/2022. Office protocol is a risk assessment can be sent to surgeon if patient has been seen in 60 days or less.   Sending to Dr Melvyn Novas  for risk assessment or recommendations if patient needs to be seen in office prior to surgical procedure.

## 2022-07-16 NOTE — Telephone Encounter (Signed)
OV notes and clearance form have been faxed back to [facility]. Nothing further needed at this time.  

## 2022-07-16 NOTE — Telephone Encounter (Signed)
Ok to clear for surgery

## 2022-07-19 ENCOUNTER — Telehealth: Payer: Self-pay | Admitting: Internal Medicine

## 2022-07-19 NOTE — Telephone Encounter (Signed)
Called and left voicemail that Dr Melvyn Novas did clear pt for surgery. Fax machine were down but he did clear patient. Nothing further needed

## 2022-07-21 ENCOUNTER — Ambulatory Visit (HOSPITAL_COMMUNITY)
Admission: RE | Admit: 2022-07-21 | Discharge: 2022-07-21 | Disposition: A | Discharge status: Home / Self Care | Payer: Medicare PPO | Source: Ambulatory Visit | Attending: Gynecologic Oncology | Admitting: Gynecologic Oncology

## 2022-07-21 DIAGNOSIS — N83209 Unspecified ovarian cyst, unspecified side: Secondary | ICD-10-CM | POA: Insufficient documentation

## 2022-07-21 DIAGNOSIS — R978 Other abnormal tumor markers: Secondary | ICD-10-CM | POA: Insufficient documentation

## 2022-07-21 MED ORDER — IOHEXOL 300 MG/ML  SOLN
75.0000 mL | Freq: Once | INTRAMUSCULAR | Status: AC | PRN
  Administered 2022-07-21: 75 mL via INTRAVENOUS

## 2022-07-22 ENCOUNTER — Telehealth: Payer: Self-pay

## 2022-07-22 NOTE — Telephone Encounter (Signed)
Recent CT report faxed and confirmed to Suzzanne Cloud NP, and Dr. Lessie Dings.

## 2022-07-22 NOTE — Telephone Encounter (Signed)
Spoke with patient and reviewed recent CT scan. Per Dr. Porfirio Mylar news - no definite evidence of any cancer spread on your CT scan (if the ovary ends up being cancer). Scan also noted benign right kidney cyst. Moderate atherosclerotic calcification of the abdominal aorta without aneurysm and cardiomegaly.   Advised patient to follow up with PCP regarding these findings. She verbalized understanding and requests a copy of the reports be sent to PCP and Cardiologist (Dr. Lessie Dings in [place]). Instructed to call with any needs.

## 2022-07-26 ENCOUNTER — Telehealth: Payer: Self-pay | Admitting: *Deleted

## 2022-07-26 NOTE — Telephone Encounter (Signed)
Telephone call to check on pre-operative status.  Patient compliant with pre-operative instructions.  Reinforced nothing to eat after midnight. Clear liquids until 1030. Patient to arrive at 1115.  No questions or concerns voiced.  Instructed to call for any needs.

## 2022-07-26 NOTE — Telephone Encounter (Signed)
Attempted to review pre op education with pt this morning. Unable to reach her, her husband or daughter. LVM on her phone and her daughter Sloan Leiter phone for a return call.

## 2022-07-27 ENCOUNTER — Other Ambulatory Visit: Payer: Self-pay

## 2022-07-27 ENCOUNTER — Encounter (HOSPITAL_COMMUNITY)
Admission: RE | Disposition: A | Discharge status: Home / Self Care | Payer: Self-pay | Source: Ambulatory Visit | Attending: Gynecologic Oncology

## 2022-07-27 ENCOUNTER — Ambulatory Visit (HOSPITAL_COMMUNITY)
Admission: RE | Admit: 2022-07-27 | Discharge: 2022-07-28 | Disposition: A | Discharge status: Home / Self Care | Payer: Medicare PPO | Source: Ambulatory Visit | Attending: Gynecologic Oncology | Admitting: Gynecologic Oncology

## 2022-07-27 ENCOUNTER — Encounter (HOSPITAL_COMMUNITY): Payer: Self-pay | Admitting: Gynecologic Oncology

## 2022-07-27 ENCOUNTER — Ambulatory Visit (HOSPITAL_COMMUNITY): Payer: Medicare PPO | Admitting: Physician Assistant

## 2022-07-27 ENCOUNTER — Ambulatory Visit (HOSPITAL_BASED_OUTPATIENT_CLINIC_OR_DEPARTMENT_OTHER): Payer: Medicare PPO | Admitting: Certified Registered Nurse Anesthetist

## 2022-07-27 DIAGNOSIS — D3910 Neoplasm of uncertain behavior of unspecified ovary: Secondary | ICD-10-CM

## 2022-07-27 DIAGNOSIS — J449 Chronic obstructive pulmonary disease, unspecified: Secondary | ICD-10-CM

## 2022-07-27 DIAGNOSIS — E1122 Type 2 diabetes mellitus with diabetic chronic kidney disease: Secondary | ICD-10-CM | POA: Diagnosis not present

## 2022-07-27 DIAGNOSIS — I129 Hypertensive chronic kidney disease with stage 1 through stage 4 chronic kidney disease, or unspecified chronic kidney disease: Secondary | ICD-10-CM | POA: Diagnosis not present

## 2022-07-27 DIAGNOSIS — R1900 Intra-abdominal and pelvic swelling, mass and lump, unspecified site: Secondary | ICD-10-CM | POA: Diagnosis present

## 2022-07-27 DIAGNOSIS — R978 Other abnormal tumor markers: Secondary | ICD-10-CM

## 2022-07-27 DIAGNOSIS — Z6831 Body mass index (BMI) 31.0-31.9, adult: Secondary | ICD-10-CM | POA: Diagnosis not present

## 2022-07-27 DIAGNOSIS — N838 Other noninflammatory disorders of ovary, fallopian tube and broad ligament: Secondary | ICD-10-CM

## 2022-07-27 DIAGNOSIS — D270 Benign neoplasm of right ovary: Secondary | ICD-10-CM | POA: Diagnosis not present

## 2022-07-27 DIAGNOSIS — Z87891 Personal history of nicotine dependence: Secondary | ICD-10-CM | POA: Insufficient documentation

## 2022-07-27 DIAGNOSIS — N83209 Unspecified ovarian cyst, unspecified side: Secondary | ICD-10-CM | POA: Diagnosis not present

## 2022-07-27 DIAGNOSIS — D4959 Neoplasm of unspecified behavior of other genitourinary organ: Secondary | ICD-10-CM

## 2022-07-27 DIAGNOSIS — N189 Chronic kidney disease, unspecified: Secondary | ICD-10-CM | POA: Insufficient documentation

## 2022-07-27 DIAGNOSIS — E669 Obesity, unspecified: Secondary | ICD-10-CM | POA: Insufficient documentation

## 2022-07-27 DIAGNOSIS — D271 Benign neoplasm of left ovary: Secondary | ICD-10-CM | POA: Diagnosis not present

## 2022-07-27 DIAGNOSIS — M1990 Unspecified osteoarthritis, unspecified site: Secondary | ICD-10-CM | POA: Insufficient documentation

## 2022-07-27 HISTORY — PX: ROBOTIC ASSISTED BILATERAL SALPINGO OOPHERECTOMY: SHX6078

## 2022-07-27 LAB — GLUCOSE, CAPILLARY
Glucose-Capillary: 86 mg/dL (ref 70–99)
Glucose-Capillary: 102 mg/dL — ABNORMAL HIGH (ref 70–99)

## 2022-07-27 LAB — ABO/RH: ABO/RH(D): B POS

## 2022-07-27 SURGERY — SALPINGO-OOPHORECTOMY, BILATERAL, ROBOT-ASSISTED
Anesthesia: General | Site: Abdomen | Laterality: Bilateral

## 2022-07-27 MED ORDER — ALBUTEROL SULFATE (2.5 MG/3ML) 0.083% IN NEBU: 2.5000 mg | INHALATION_SOLUTION | RESPIRATORY_TRACT | Status: DC | PRN

## 2022-07-27 MED ORDER — HYDROMORPHONE HCL 1 MG/ML IJ SOLN: 0.5000 mg | INTRAMUSCULAR | Status: DC | PRN

## 2022-07-27 MED ORDER — SUGAMMADEX SODIUM 200 MG/2ML IV SOLN
INTRAVENOUS | Status: DC | PRN
  Administered 2022-07-27: 200 mg via INTRAVENOUS

## 2022-07-27 MED ORDER — ORAL CARE MOUTH RINSE
15.0000 mL | Freq: Once | OROMUCOSAL | Status: AC
  Administered 2022-07-27: 15 mL via OROMUCOSAL

## 2022-07-27 MED ORDER — CITALOPRAM HYDROBROMIDE 20 MG PO TABS
20.0000 mg | ORAL_TABLET | Freq: Every day | ORAL | Status: DC
  Administered 2022-07-28: 20 mg via ORAL
  Filled 2022-07-27: qty 1

## 2022-07-27 MED ORDER — LACTATED RINGERS IV SOLN: INTRAVENOUS | Status: DC | PRN

## 2022-07-27 MED ORDER — PHENYLEPHRINE 80 MCG/ML (10ML) SYRINGE FOR IV PUSH (FOR BLOOD PRESSURE SUPPORT)
PREFILLED_SYRINGE | INTRAVENOUS | Status: DC | PRN
  Administered 2022-07-27 (×2): 40 ug via INTRAVENOUS

## 2022-07-27 MED ORDER — FENTANYL CITRATE (PF) 100 MCG/2ML IJ SOLN
INTRAMUSCULAR | Status: AC
  Filled 2022-07-27: qty 2

## 2022-07-27 MED ORDER — ONDANSETRON HCL 4 MG/2ML IJ SOLN: 4.0000 mg | Freq: Four times a day (QID) | INTRAMUSCULAR | Status: DC | PRN

## 2022-07-27 MED ORDER — FENTANYL CITRATE (PF) 100 MCG/2ML IJ SOLN
INTRAMUSCULAR | Status: DC | PRN
  Administered 2022-07-27: 75 ug via INTRAVENOUS
  Administered 2022-07-27: 50 ug via INTRAVENOUS
  Administered 2022-07-27: 25 ug via INTRAVENOUS
  Administered 2022-07-27: 50 ug via INTRAVENOUS
  Administered 2022-07-27 (×2): 25 ug via INTRAVENOUS

## 2022-07-27 MED ORDER — LIDOCAINE HCL (PF) 2 % IJ SOLN
INTRAMUSCULAR | Status: DC | PRN
  Administered 2022-07-27: 1.5 mg/kg/h via INTRADERMAL

## 2022-07-27 MED ORDER — BUPIVACAINE HCL 0.25 % IJ SOLN
INTRAMUSCULAR | Status: AC
  Filled 2022-07-27: qty 1

## 2022-07-27 MED ORDER — TRAMADOL HCL 50 MG PO TABS: 100.0000 mg | ORAL_TABLET | Freq: Two times a day (BID) | ORAL | Status: DC | PRN

## 2022-07-27 MED ORDER — BUPIVACAINE HCL 0.25 % IJ SOLN
INTRAMUSCULAR | Status: DC | PRN
  Administered 2022-07-27: 32 mL

## 2022-07-27 MED ORDER — PHENYLEPHRINE HCL (PRESSORS) 10 MG/ML IV SOLN
INTRAVENOUS | Status: AC
Start: 2022-07-27 — End: ?
  Filled 2022-07-27: qty 1

## 2022-07-27 MED ORDER — LACTATED RINGERS IR SOLN
Status: DC | PRN
  Administered 2022-07-27: 1000 mL

## 2022-07-27 MED ORDER — KCL IN DEXTROSE-NACL 20-5-0.45 MEQ/L-%-% IV SOLN
INTRAVENOUS | Status: DC
  Filled 2022-07-27 (×2): qty 1000

## 2022-07-27 MED ORDER — LIDOCAINE 2% (20 MG/ML) 5 ML SYRINGE
INTRAMUSCULAR | Status: DC | PRN
  Administered 2022-07-27: 80 mg via INTRAVENOUS

## 2022-07-27 MED ORDER — ACETAMINOPHEN 500 MG PO TABS
1000.0000 mg | ORAL_TABLET | Freq: Two times a day (BID) | ORAL | Status: DC
  Administered 2022-07-28: 1000 mg via ORAL
  Filled 2022-07-27: qty 2

## 2022-07-27 MED ORDER — ENOXAPARIN SODIUM 40 MG/0.4ML IJ SOSY: 40.0000 mg | PREFILLED_SYRINGE | INTRAMUSCULAR | Status: DC

## 2022-07-27 MED ORDER — UMECLIDINIUM BROMIDE 62.5 MCG/ACT IN AEPB
1.0000 | INHALATION_SPRAY | Freq: Every day | RESPIRATORY_TRACT | Status: DC
  Administered 2022-07-28: 1 via RESPIRATORY_TRACT
  Filled 2022-07-27: qty 7

## 2022-07-27 MED ORDER — DEXAMETHASONE SODIUM PHOSPHATE 10 MG/ML IJ SOLN
INTRAMUSCULAR | Status: DC | PRN
  Administered 2022-07-27: 4 mg via INTRAVENOUS

## 2022-07-27 MED ORDER — STERILE WATER FOR IRRIGATION IR SOLN
Status: DC | PRN
  Administered 2022-07-27: 1000 mL

## 2022-07-27 MED ORDER — ALBUTEROL SULFATE HFA 108 (90 BASE) MCG/ACT IN AERS: 2.0000 | INHALATION_SPRAY | RESPIRATORY_TRACT | Status: DC | PRN

## 2022-07-27 MED ORDER — BUDESON-GLYCOPYRROL-FORMOTEROL 160-9-4.8 MCG/ACT IN AERO: 2.0000 | INHALATION_SPRAY | Freq: Two times a day (BID) | RESPIRATORY_TRACT | Status: DC

## 2022-07-27 MED ORDER — MONTELUKAST SODIUM 10 MG PO TABS
10.0000 mg | ORAL_TABLET | Freq: Every day | ORAL | Status: DC
  Administered 2022-07-28: 10 mg via ORAL
  Filled 2022-07-27: qty 1

## 2022-07-27 MED ORDER — METOPROLOL TARTRATE 50 MG PO TABS
100.0000 mg | ORAL_TABLET | Freq: Two times a day (BID) | ORAL | Status: DC
  Administered 2022-07-27 – 2022-07-28 (×2): 100 mg via ORAL
  Filled 2022-07-27 (×2): qty 2

## 2022-07-27 MED ORDER — ATORVASTATIN CALCIUM 40 MG PO TABS
80.0000 mg | ORAL_TABLET | Freq: Every day | ORAL | Status: DC
  Administered 2022-07-28: 80 mg via ORAL
  Filled 2022-07-27: qty 2

## 2022-07-27 MED ORDER — PHENYLEPHRINE HCL-NACL 20-0.9 MG/250ML-% IV SOLN
INTRAVENOUS | Status: DC | PRN
  Administered 2022-07-27: 20 ug/min via INTRAVENOUS

## 2022-07-27 MED ORDER — ONDANSETRON HCL 4 MG PO TABS: 4.0000 mg | ORAL_TABLET | Freq: Four times a day (QID) | ORAL | Status: DC | PRN

## 2022-07-27 MED ORDER — GABAPENTIN 300 MG PO CAPS
300.0000 mg | ORAL_CAPSULE | Freq: Three times a day (TID) | ORAL | Status: DC
  Administered 2022-07-27 – 2022-07-28 (×2): 300 mg via ORAL
  Filled 2022-07-27 (×2): qty 1

## 2022-07-27 MED ORDER — MOMETASONE FURO-FORMOTEROL FUM 100-5 MCG/ACT IN AERO
2.0000 | INHALATION_SPRAY | Freq: Two times a day (BID) | RESPIRATORY_TRACT | Status: DC
Start: 2022-07-27 — End: 2022-07-28
  Administered 2022-07-27 – 2022-07-28 (×2): 2 via RESPIRATORY_TRACT
  Filled 2022-07-27: qty 8.8

## 2022-07-27 MED ORDER — SENNOSIDES-DOCUSATE SODIUM 8.6-50 MG PO TABS
2.0000 | ORAL_TABLET | Freq: Every day | ORAL | Status: DC
  Administered 2022-07-27: 2 via ORAL
  Filled 2022-07-27: qty 2

## 2022-07-27 MED ORDER — HEPARIN SODIUM (PORCINE) 5000 UNIT/ML IJ SOLN
5000.0000 [IU] | INTRAMUSCULAR | Status: AC
  Administered 2022-07-27: 5000 [IU] via SUBCUTANEOUS
  Filled 2022-07-27: qty 1

## 2022-07-27 MED ORDER — DEXAMETHASONE SODIUM PHOSPHATE 4 MG/ML IJ SOLN: 4.0000 mg | INTRAMUSCULAR | Status: DC

## 2022-07-27 MED ORDER — PHENYLEPHRINE HCL (PRESSORS) 10 MG/ML IV SOLN
INTRAVENOUS | Status: AC
  Filled 2022-07-27: qty 1

## 2022-07-27 MED ORDER — AMLODIPINE BESYLATE 5 MG PO TABS
5.0000 mg | ORAL_TABLET | Freq: Every day | ORAL | Status: DC
  Administered 2022-07-28: 5 mg via ORAL
  Filled 2022-07-27: qty 1

## 2022-07-27 MED ORDER — ROCURONIUM BROMIDE 10 MG/ML (PF) SYRINGE
PREFILLED_SYRINGE | INTRAVENOUS | Status: DC | PRN
  Administered 2022-07-27: 40 mg via INTRAVENOUS
  Administered 2022-07-27: 60 mg via INTRAVENOUS

## 2022-07-27 MED ORDER — LACTATED RINGERS IV SOLN: INTRAVENOUS | Status: DC

## 2022-07-27 MED ORDER — IBUPROFEN 200 MG PO TABS: 600.0000 mg | ORAL_TABLET | Freq: Four times a day (QID) | ORAL | Status: DC | PRN

## 2022-07-27 MED ORDER — ONDANSETRON HCL 4 MG/2ML IJ SOLN
INTRAMUSCULAR | Status: DC | PRN
  Administered 2022-07-27: 4 mg via INTRAVENOUS

## 2022-07-27 MED ORDER — CHLORHEXIDINE GLUCONATE 0.12 % MT SOLN: 15.0000 mL | Freq: Once | OROMUCOSAL | Status: AC

## 2022-07-27 MED ORDER — FENTANYL CITRATE (PF) 250 MCG/5ML IJ SOLN
INTRAMUSCULAR | Status: AC
  Filled 2022-07-27: qty 5

## 2022-07-27 MED ORDER — PROPOFOL 10 MG/ML IV BOLUS
INTRAVENOUS | Status: DC | PRN
  Administered 2022-07-27: 80 mg via INTRAVENOUS

## 2022-07-27 MED ORDER — ACETAMINOPHEN 500 MG PO TABS
1000.0000 mg | ORAL_TABLET | ORAL | Status: AC
  Administered 2022-07-27: 1000 mg via ORAL
  Filled 2022-07-27: qty 2

## 2022-07-27 SURGICAL SUPPLY — 76 items
APPLICATOR SURGIFLO ENDO (HEMOSTASIS) IMPLANT
BAG LAPAROSCOPIC 12 15 PORT 16 (BASKET) IMPLANT
BAG RETRIEVAL 12/15 (BASKET) ×6
BLADE SURG SZ10 CARB STEEL (BLADE) IMPLANT
COVER BACK TABLE 60X90IN (DRAPES) ×2 IMPLANT
COVER TIP SHEARS 8 DVNC (MISCELLANEOUS) ×1 IMPLANT
COVER TIP SHEARS 8MM DA VINCI (MISCELLANEOUS) ×1
DERMABOND ADVANCED (GAUZE/BANDAGES/DRESSINGS) ×1
DERMABOND ADVANCED .7 DNX12 (GAUZE/BANDAGES/DRESSINGS) ×1 IMPLANT
DRAPE ARM DVNC X/XI (DISPOSABLE) ×4 IMPLANT
DRAPE COLUMN DVNC XI (DISPOSABLE) ×1 IMPLANT
DRAPE DA VINCI XI ARM (DISPOSABLE) ×4
DRAPE DA VINCI XI COLUMN (DISPOSABLE) ×1
DRAPE SHEET LG 3/4 BI-LAMINATE (DRAPES) ×2 IMPLANT
DRAPE SURG IRRIG POUCH 19X23 (DRAPES) ×2 IMPLANT
DRSG OPSITE POSTOP 4X6 (GAUZE/BANDAGES/DRESSINGS) IMPLANT
DRSG OPSITE POSTOP 4X8 (GAUZE/BANDAGES/DRESSINGS) IMPLANT
ELECT PENCIL ROCKER SW 15FT (MISCELLANEOUS) IMPLANT
ELECT REM PT RETURN 15FT ADLT (MISCELLANEOUS) ×2 IMPLANT
GAUZE 4X4 16PLY ~~LOC~~+RFID DBL (SPONGE) ×4 IMPLANT
GLOVE BIO SURGEON STRL SZ 6 (GLOVE) ×8 IMPLANT
GLOVE BIO SURGEON STRL SZ 6.5 (GLOVE) IMPLANT
GOWN STRL REUS W/ TWL LRG LVL3 (GOWN DISPOSABLE) ×4 IMPLANT
GOWN STRL REUS W/TWL LRG LVL3 (GOWN DISPOSABLE) ×4
GRASPER SUT TROCAR 14GX15 (MISCELLANEOUS) ×1 IMPLANT
HOLDER FOLEY CATH W/STRAP (MISCELLANEOUS) IMPLANT
IRRIG SUCT STRYKERFLOW 2 WTIP (MISCELLANEOUS) ×2
IRRIGATION SUCT STRKRFLW 2 WTP (MISCELLANEOUS) ×1 IMPLANT
KIT PROCEDURE DA VINCI SI (MISCELLANEOUS)
KIT PROCEDURE DVNC SI (MISCELLANEOUS) IMPLANT
KIT TURNOVER KIT A (KITS) IMPLANT
LIGASURE IMPACT 36 18CM CVD LR (INSTRUMENTS) IMPLANT
MANIPULATOR ADVINCU DEL 3.0 PL (MISCELLANEOUS) IMPLANT
MANIPULATOR ADVINCU DEL 3.5 PL (MISCELLANEOUS) IMPLANT
MANIPULATOR UTERINE 4.5 ZUMI (MISCELLANEOUS) IMPLANT
NDL HYPO 21X1.5 SAFETY (NEEDLE) ×1 IMPLANT
NDL SPNL 18GX3.5 QUINCKE PK (NEEDLE) IMPLANT
NEEDLE HYPO 21X1.5 SAFETY (NEEDLE) ×2 IMPLANT
NEEDLE SPNL 18GX3.5 QUINCKE PK (NEEDLE) IMPLANT
OBTURATOR OPTICAL STANDARD 8MM (TROCAR) ×1
OBTURATOR OPTICAL STND 8 DVNC (TROCAR) ×1
OBTURATOR OPTICALSTD 8 DVNC (TROCAR) ×1 IMPLANT
PACK ROBOT GYN CUSTOM WL (TRAY / TRAY PROCEDURE) ×2 IMPLANT
PAD POSITIONING PINK XL (MISCELLANEOUS) ×2 IMPLANT
PORT ACCESS TROCAR AIRSEAL 12 (TROCAR) ×1 IMPLANT
PORT ACCESS TROCAR AIRSEAL 5M (TROCAR) ×1
RETRACTOR LAPSCP 12X46 CVD (ENDOMECHANICALS) IMPLANT
RTRCTR LAPSCP 12X46 CVD (ENDOMECHANICALS) ×2
SCRUB CHG 4% DYNA-HEX 4OZ (MISCELLANEOUS) ×4 IMPLANT
SEAL CANN UNIV 5-8 DVNC XI (MISCELLANEOUS) ×4 IMPLANT
SEAL XI 5MM-8MM UNIVERSAL (MISCELLANEOUS) ×4
SEALER VESSEL DA VINCI XI (MISCELLANEOUS) ×1
SEALER VESSEL EXT DVNC XI (MISCELLANEOUS) IMPLANT
SET TRI-LUMEN FLTR TB AIRSEAL (TUBING) ×2 IMPLANT
SPIKE FLUID TRANSFER (MISCELLANEOUS) ×2 IMPLANT
SPONGE T-LAP 18X18 ~~LOC~~+RFID (SPONGE) IMPLANT
SURGIFLO W/THROMBIN 8M KIT (HEMOSTASIS) IMPLANT
SUT MNCRL AB 4-0 PS2 18 (SUTURE) IMPLANT
SUT PDS AB 1 TP1 96 (SUTURE) IMPLANT
SUT VIC AB 0 CT1 27 (SUTURE)
SUT VIC AB 0 CT1 27XBRD ANTBC (SUTURE) IMPLANT
SUT VIC AB 2-0 CT1 27 (SUTURE)
SUT VIC AB 2-0 CT1 TAPERPNT 27 (SUTURE) IMPLANT
SUT VIC AB 4-0 PS2 18 (SUTURE) ×4 IMPLANT
SYR 10ML LL (SYRINGE) IMPLANT
SYS BAG RETRIEVAL 10MM (BASKET)
SYS WOUND ALEXIS 18CM MED (MISCELLANEOUS)
SYSTEM BAG RETRIEVAL 10MM (BASKET) IMPLANT
SYSTEM WOUND ALEXIS 18CM MED (MISCELLANEOUS) IMPLANT
TOWEL OR NON WOVEN STRL DISP B (DISPOSABLE) IMPLANT
TRAP SPECIMEN MUCUS 40CC (MISCELLANEOUS) IMPLANT
TRAY FOLEY MTR SLVR 16FR STAT (SET/KITS/TRAYS/PACK) ×2 IMPLANT
TROCAR Z-THREAD FIOS 5X100MM (TROCAR) IMPLANT
UNDERPAD 30X36 HEAVY ABSORB (UNDERPADS AND DIAPERS) ×4 IMPLANT
WATER STERILE IRR 1000ML POUR (IV SOLUTION) ×2 IMPLANT
YANKAUER SUCT BULB TIP 10FT TU (MISCELLANEOUS) IMPLANT

## 2022-07-27 NOTE — Anesthesia Preprocedure Evaluation (Addendum)
Anesthesia Evaluation  Patient identified by MRN, date of birth, ID band Patient awake    Reviewed: Allergy & Precautions, NPO status , Patient's Chart, lab work & pertinent test results, reviewed documented beta blocker date and time   Airway Mallampati: II  TM Distance: >3 FB Neck ROM: Full    Dental  (+) Edentulous Upper, Edentulous Lower   Pulmonary shortness of breath, asthma , COPD, Recent URI , former smoker,    Pulmonary exam normal breath sounds clear to auscultation       Cardiovascular hypertension, Pt. on medications and Pt. on home beta blockers Normal cardiovascular exam Rhythm:Regular Rate:Normal     Neuro/Psych negative neurological ROS     GI/Hepatic negative GI ROS, Neg liver ROS,   Endo/Other  diabetes  Renal/GU Renal disease     Musculoskeletal  (+) Arthritis ,   Abdominal (+) + obese,   Peds  Hematology negative hematology ROS (+)   Anesthesia Other Findings   Reproductive/Obstetrics                          Anesthesia Physical Anesthesia Plan  ASA: 3  Anesthesia Plan: General   Post-op Pain Management: Tylenol PO (pre-op)*, Celebrex PO (pre-op)* and Gabapentin PO (pre-op)*   Induction: Intravenous  PONV Risk Score and Plan: 4 or greater and Ondansetron, Dexamethasone and Treatment may vary due to age or medical condition  Airway Management Planned: Oral ETT  Additional Equipment: Arterial line  Intra-op Plan:   Post-operative Plan: Extubation in OR  Informed Consent: I have reviewed the patients History and Physical, chart, labs and discussed the procedure including the risks, benefits and alternatives for the proposed anesthesia with the patient or authorized representative who has indicated his/her understanding and acceptance.     Dental advisory given  Plan Discussed with: CRNA  Anesthesia Plan Comments: (2 x PIV  No record of CAD on PAT note. Pt  with 2v CABG in 2013. Last cardiology visit was 04/2021. Last Echo in 2021 showed normal LVSF (EF 60-65%). Pt with some hx of DOE and LE edema and she has pronounced cardiomegaly on CRX from 10/2021. She states that her SOB is not significantly worse than prior year. )     Anesthesia Quick Evaluation

## 2022-07-27 NOTE — Anesthesia Procedure Notes (Signed)
Arterial Line Insertion Start/End8/07/2022 2:40 PM, 07/27/2022 2:48 PM Performed by: Milford Cage, CRNA, CRNA  Patient location: OR. Preanesthetic checklist: patient identified, IV checked, site marked, risks and benefits discussed, surgical consent, monitors and equipment checked, pre-op evaluation, timeout performed and anesthesia consent Lidocaine 1% used for infiltration and patient sedated Left, radial was placed Catheter size: 20 G Hand hygiene performed , maximum sterile barriers used  and Seldinger technique used Allen's test indicative of satisfactory collateral circulation Attempts: 1 Procedure performed without using ultrasound guided technique. Following insertion, Biopatch and dressing applied. Post procedure assessment: normal and unchanged  Patient tolerated the procedure well with no immediate complications.

## 2022-07-27 NOTE — Interval H&P Note (Signed)
History and Physical Interval Note:  07/27/2022 11:08 AM  Debra [name]  has presented today for surgery, with the diagnosis of [place].  The various methods of treatment have been discussed with the patient and family. After consideration of risks, benefits and other options for treatment, the patient has consented to  Procedure(s): XI ROBOTIC ASSISTED BILATERAL SALPINGO OOPHORECTOMY,POSSIBLE STAGING, POSSIBLE LAPAROTOMY (Bilateral) as a surgical intervention.  The patient's history has been reviewed, patient examined, no change in status, stable for surgery.  I have reviewed the patient's chart and labs.  Questions were answered to the patient's satisfaction.     Lafonda Mosses

## 2022-07-27 NOTE — Discharge Instructions (Signed)
AFTER SURGERY INSTRUCTIONS   Return to work: 4-6 weeks if applicable   Activity: 1. Be up and out of the bed during the day.  Take a nap if needed.  You may walk up steps but be careful and use the hand rail.  Stair climbing will tire you more than you think, you may need to stop part way and rest.    2. No lifting or straining for 6 weeks over 10 pounds. No pushing, pulling, straining for 6 weeks.   3. No driving for around 1 week(s).  Do not drive if you are taking narcotic pain medicine and make sure that your reaction time has returned.    4. You can shower as soon as the next day after surgery. Shower daily.  Use your regular soap and water (not directly on the incision) and pat your incision(s) dry afterwards; don't rub.  No tub baths or submerging your body in water until cleared by your surgeon. If you have the soap that was given to you by pre-surgical testing that was used before surgery, you do not need to use it afterwards because this can irritate your incisions.    5. No sexual activity and nothing in the vagina for 4 weeks.   6. You may experience a small amount of clear drainage from your incisions, which is normal.  If the drainage persists, increases, or changes color please call the office.   7. Do not use creams, lotions, or ointments such as neosporin on your incisions after surgery until advised by your surgeon because they can cause removal of the dermabond glue on your incisions.     8. Take Tylenol or ibuprofen first for pain if you are able to take these medications and only use narcotic pain medication for severe pain not relieved by the Tylenol or Ibuprofen.  Monitor your Tylenol intake to a max of 4,000 mg in a 24 hour period. You can alternate these medications after surgery.   Diet: 1. Low sodium Heart Healthy Diet is recommended but you are cleared to resume your normal (before surgery) diet after your procedure.   2. It is safe to use a laxative, such as  Miralax or Colace, if you have difficulty moving your bowels. You have been prescribed Sennakot-S to take at bedtime every evening after surgery to keep bowel movements regular and to prevent constipation.     Wound Care: 1. Keep clean and dry.  Shower daily.   Reasons to call the Doctor: Fever - Oral temperature greater than 100.4 degrees Fahrenheit Foul-smelling vaginal discharge Difficulty urinating Nausea and vomiting Increased pain at the site of the incision that is unrelieved with pain medicine. Difficulty breathing with or without chest pain New calf pain especially if only on one side Sudden, continuing increased vaginal bleeding with or without clots.   Contacts: For questions or concerns you should contact:   Dr. Jeral Pinch at [phone]   Joylene John, NP at [phone]   After Hours: call [phone] and have the GYN Oncologist paged/contacted (after 5 pm or on the weekends).   Messages sent via mychart are for non-urgent matters and are not responded to after hours so for urgent needs, please call the after hours number.

## 2022-07-27 NOTE — Transfer of Care (Signed)
Immediate Anesthesia Transfer of Care Note  Patient: Debra [name]  Procedure(s) Performed: XI ROBOTIC ASSISTED BILATERAL SALPINGO OOPHORECTOMY STAGING, (Bilateral: Abdomen)  Patient Location: PACU  Anesthesia Type:General  Level of Consciousness: sedated, patient cooperative and responds to stimulation  Airway & Oxygen Therapy: Patient Spontanous Breathing and Patient connected to face mask oxygen  Post-op Assessment: Report given to RN and Post -op Vital signs reviewed and stable  Post vital signs: Reviewed and stable  Last Vitals:  Vitals Value Taken Time  BP 156/89 07/27/22 1724  Temp    Pulse 69 07/27/22 1726  Resp 22 07/27/22 1726  SpO2 100 % 07/27/22 1726  Vitals shown include unvalidated device data.  Last Pain:  Vitals:   07/27/22 1151  TempSrc:   PainSc: 0-No pain         Complications: No notable events documented.

## 2022-07-27 NOTE — Interval H&P Note (Signed)
History and Physical Interval Note:  07/27/2022 2:18 PM  Debra [name]  has presented today for surgery, with the diagnosis of [place].  The various methods of treatment have been discussed with the patient and family. After consideration of risks, benefits and other options for treatment, the patient has consented to  Procedure(s): XI ROBOTIC ASSISTED BILATERAL SALPINGO OOPHORECTOMY,POSSIBLE STAGING, POSSIBLE LAPAROTOMY (Bilateral) as a surgical intervention.  The patient's history has been reviewed, patient examined, no change in status, stable for surgery.  I have reviewed the patient's chart and labs.  Questions were answered to the patient's satisfaction.     Lafonda Mosses

## 2022-07-27 NOTE — Anesthesia Procedure Notes (Signed)
Procedure Name: Intubation Date/Time: 07/27/2022 2:55 PM  Performed by: Milford Cage, CRNAPre-anesthesia Checklist: Patient identified, Emergency Drugs available, Suction available and Patient being monitored Patient Re-evaluated:Patient Re-evaluated prior to induction Oxygen Delivery Method: Circle system utilized Preoxygenation: Pre-oxygenation with 100% oxygen Induction Type: IV induction Ventilation: Mask ventilation without difficulty Laryngoscope Size: Miller and 2 Grade View: Grade I Tube type: Oral Tube size: 7.0 mm Number of attempts: 1 Airway Equipment and Method: Stylet Placement Confirmation: ETT inserted through vocal cords under direct vision, positive ETCO2 and breath sounds checked- equal and bilateral Secured at: 21 cm Tube secured with: Tape Dental Injury: Teeth and Oropharynx as per pre-operative assessment

## 2022-07-27 NOTE — Op Note (Signed)
OPERATIVE NOTE  Pre-operative Diagnosis: Adnexal mass, elevated tumor markers  Post-operative Diagnosis: same, serous borderline tumor of the ovary  Operation: Robotic-assisted laparoscopic bilateral salpingo-oophorectomy, lysis of adhesions, staging including peritoneal biopsies and omental biopsy, cystoscopy   Surgeon: Jeral Pinch MD  Assistant Surgeon: Lahoma Crocker MD (an MD assistant was necessary for tissue manipulation, management of robotic instrumentation, retraction and positioning due to the complexity of the case and hospital policies).   Anesthesia: GET  Urine Output: 125cc  Operative Findings:  On exam under anesthesia, fullness in the right adnexa, minimally mobile.  On intra-abdominal entry, normal upper abdominal survey although somewhat limited view of the right upper quadrant secondary to prior cholecystectomy.  Omentum normal appearing with some adhesions to the anterior abdominal wall just below the umbilicus.  Approximately 1 cm umbilical hernia.  Normal-appearing small and large bowel.  Significant gas within the bowels and adiposity made view of the pelvis quite challenging.  Approximately 5 cm right ovarian mass replacing the ovary, mostly cystic with small solid area.  Normal-appearing fallopian tube.  Normal-appearing left adnexa.  No carcinomatosis or ascites. 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 Given difficulty with deep pelvis visualization, cystoscopy performed at the end of surgery as ureters were not appreciated transperitoneally or retroperitoneally.  On cystoscopy, bladder dome intact, good efflux noted from bilateral ureteral orifices.  Estimated Blood Loss:  75cc      Total IV Fluids: see I&O flowsheet         Specimens: bilateral tubes and ovaries, right ovarian remnant, pelvic washings, peritoneal biopsies, omental biopsy         Complications:  None apparent; patient tolerated the  procedure well.         Disposition: PACU - hemodynamically stable.  Procedure Details  The patient was seen in the Holding Room. The risks, benefits, complications, treatment options, and expected outcomes were discussed with the patient.  The patient concurred with the proposed plan, giving informed consent.  The site of surgery properly noted/marked. The patient was identified as Debra [name] and the procedure verified as a Robotic-assisted bilateral salpingo-oophorectomy with any other indicated procedures.   After induction of anesthesia, the patient was draped and prepped in the usual sterile manner. Patient was placed in supine position after anesthesia and draped and prepped in the usual sterile manner as follows: Her arms were tucked to her side with all appropriate precautions.  The shoulders were stabilized with padded shoulder blocks applied to the acromium processes.  The patient was placed in the semi-lithotomy position in [place].  The perineum and vagina were prepped with CholoraPrep. The patient was draped after the CholoraPrep had been allowed to dry for 3 minutes.  A Time Out was held and the above information confirmed.  The urethra was prepped with Betadine. Foley catheter was placed. OG tube placement was confirmed and to suction.   Next, a 10 mm skin incision was made 1 cm below the subcostal margin in the midclavicular line.  The 5 mm Optiview port and scope was used for direct entry.  Opening pressure was under 10 mm CO2.  The abdomen was insufflated and the findings were noted as above.   At this point and all points during the procedure, the patient's intra-abdominal pressure did not exceed 15 mmHg. Next, an 8 mm skin incision was made superior to the umbilicus and a right and left port were placed about 8 cm lateral to the  robot port on the right and left side.  A fourth arm was placed on the right.  The 5 mm assist trocar was exchanged for a 10-12 mm port.  A second 5  mm assist trocar was placed in the left upper quadrant.  All ports were placed under direct visualization.  The patient was placed in steep Trendelenburg.  The robot was docked in the normal manner.  Attention was first turned to the omentum.  Monopolar electrocautery was used to lyse adhesions of the omentum to the anterior abdominal wall.  Visualization of the pelvis was quite challenging.  The laparoscopic paddle was used to assist with visualization.  The right and left peritoneum were opened parallel to the IP ligament to open the retroperitoneal spaces bilaterally. The ureter was not able to be appreciated given deep pelvis and significant adiposity.  The peritoneum just under the infundibulopelvic ligament was incised and stretched and the infundibulopelvic ligament was skeletonized, cauterized and cut.  On the right, the adnexal mass was somewhat adherent to the medial leaf of the broad ligament and to the vaginal cuff.  With the adnexa elevated, sharp dissection and short burst of monopolar electrocautery were used to lyse these adhesions.  Once freed, the right adnexa was placed in an Endo Catch bag, brought out through the assist trocar, drained in a contained manner, and once removed sent to pathology for frozen section.  The same procedure was then performed on the left and once the left ovary and fallopian tube were detached, they were placed in an Endo Catch bag.  Irrigation was used and excellent hemostasis was achieved in the pelvis.    Frozen section returned showing borderline tumor, no invasive malignancy identified.  Peritoneal biopsies were taken.  1 robotic instrument was removed and the patient was taken out of Trendelenburg positioning.  Using monopolar electrocautery and the vessel sealer, an infracolic omental biopsy was performed.  The omentum was placed in an Endo Catch bag.  Both the omentum and the left adnexa were removed through the assist trocar.  At this point in the  procedure was completed.  Robotic instruments were removed under direct visulaization.  The robot was undocked. The fascia at the 10-12 mm port was closed with 0 Vicryl using a PMI Dashiell closure device under direct visualization.  Abdomen was then desufflated. The subcuticular tissue was closed with 4-0 Vicryl and the skin was closed with 4-0 Monocryl in a subcuticular manner.  Small hematoma was noted at the lateral aspect of the left mid abdominal incision.  A mattress suture in a figure-of-eight were used to achieve hemostasis.  Dermabond was applied to all incisions.  Foley catheter was removed.  All sponge, lap and needle counts were correct x  3.   The patient was transferred to the recovery room in stable condition.  Jeral Pinch, MD

## 2022-07-27 NOTE — Brief Op Note (Signed)
07/27/2022  5:24 PM  PATIENT:  Debra [name]  76 y.o. female  PRE-OPERATIVE DIAGNOSIS:  COMPLEX ADNEXAL CYST,ELEVATED TUMOR MARKERS  POST-OPERATIVE DIAGNOSIS:  complex adnexal cyst, elevated tumor  PROCEDURE:  Procedure(s): XI ROBOTIC ASSISTED BILATERAL SALPINGO OOPHORECTOMY STAGING, (Bilateral) cystoscopy  SURGEON:  Surgeon(s) and Role:    Lafonda Mosses, MD - Primary    Lahoma Crocker, MD - Assisting   ANESTHESIA:   general  EBL:  75 mL   BLOOD ADMINISTERED:none  DRAINS: none   LOCAL MEDICATIONS USED:  MARCAINE     SPECIMEN:  bilateral tubes and ovaries, pelvic washings, peritoneal biopsies, omental biopsy  DISPOSITION OF SPECIMEN:  PATHOLOGY  COUNTS:  YES  TOURNIQUET:  * No tourniquets in log *  DICTATION: .Note written in EPIC  PLAN OF CARE: Admit for overnight observation  PATIENT DISPOSITION:  PACU - hemodynamically stable.   Delay start of Pharmacological VTE agent (>24hrs) due to surgical blood loss or risk of bleeding: not applicable

## 2022-07-27 NOTE — Anesthesia Postprocedure Evaluation (Signed)
Anesthesia Post Note  Patient: Debra [name]  Procedure(s) Performed: XI ROBOTIC ASSISTED BILATERAL SALPINGO OOPHORECTOMY STAGING, (Bilateral: Abdomen)     Patient location during evaluation: PACU Anesthesia Type: General Level of consciousness: sedated and patient cooperative Pain management: pain level controlled Vital Signs Assessment: post-procedure vital signs reviewed and stable Respiratory status: spontaneous breathing Cardiovascular status: stable Anesthetic complications: no   No notable events documented.  Last Vitals:  Vitals:   07/27/22 1915 07/27/22 1942  BP: (!) 143/65 139/62  Pulse: 66 64  Resp: (!) 25 20  Temp:  36.6 C  SpO2: 100% 98%    Last Pain:  Vitals:   07/27/22 1942  TempSrc: Oral  PainSc:                  Nolon Nations

## 2022-07-28 ENCOUNTER — Encounter (HOSPITAL_COMMUNITY): Payer: Self-pay | Admitting: Gynecologic Oncology

## 2022-07-28 DIAGNOSIS — D270 Benign neoplasm of right ovary: Secondary | ICD-10-CM | POA: Diagnosis not present

## 2022-07-28 LAB — CBC
HCT: 35.2 % — ABNORMAL LOW (ref 36.0–46.0)
Hemoglobin: 11.4 g/dL — ABNORMAL LOW (ref 12.0–15.0)
MCH: 30 pg (ref 26.0–34.0)
MCHC: 32.4 g/dL (ref 30.0–36.0)
MCV: 92.6 fL (ref 80.0–100.0)
Platelets: 201 10*3/uL (ref 150–400)
RBC: 3.8 MIL/uL — ABNORMAL LOW (ref 3.87–5.11)
RDW: 14.5 % (ref 11.5–15.5)
WBC: 16.4 10*3/uL — ABNORMAL HIGH (ref 4.0–10.5)
nRBC: 0 % (ref 0.0–0.2)

## 2022-07-28 LAB — BASIC METABOLIC PANEL
Anion gap: 7 (ref 5–15)
BUN: 31 mg/dL — ABNORMAL HIGH (ref 8–23)
CO2: 28 mmol/L (ref 22–32)
Calcium: 9.2 mg/dL (ref 8.9–10.3)
Chloride: 107 mmol/L (ref 98–111)
Creatinine, Ser: 1.28 mg/dL — ABNORMAL HIGH (ref 0.44–1.00)
GFR, Estimated: 44 mL/min — ABNORMAL LOW (ref >60)
Glucose, Bld: 175 mg/dL — ABNORMAL HIGH (ref 70–99)
Potassium: 5.3 mmol/L — ABNORMAL HIGH (ref 3.5–5.1)
Sodium: 142 mmol/L (ref 135–145)

## 2022-07-28 MED ORDER — ENOXAPARIN SODIUM 40 MG/0.4ML IJ SOSY: 40.0000 mg | PREFILLED_SYRINGE | INTRAMUSCULAR | Status: DC

## 2022-07-28 NOTE — Plan of Care (Signed)

## 2022-07-28 NOTE — TOC Initial Note (Signed)
Transition of Care [facility]) - Initial/Assessment Note    Patient Details  Name: Debra [name] MRN: [id number] Date of Birth: [birth year]  Transition of Care [facility]) CM/SW Contact:    Leeroy Cha, RN Phone Number: 07/28/2022, 9:33 AM  Clinical Narrative:                  Transition of Care [facility]) Screening Note   Patient Details  Name: Debra [name] Date of Birth: December 07, 1946   Transition of Care [facility]) CM/SW Contact:    Leeroy Cha, RN Phone Number: 07/28/2022, 9:33 AM    Transition of Care Department [facility]) has reviewed patient and no TOC needs have been identified at this time. We will continue to monitor patient advancement through interdisciplinary progression rounds. If new patient transition needs arise, please place a TOC consult.    Expected Discharge Plan: Home/Self Care Barriers to Discharge: No Barriers Identified   Patient Goals and CMS Choice Patient states their goals for this hospitalization and ongoing recovery are:: to go home CMS Medicare.gov Compare Post Acute Care list provided to:: Patient Choice offered to / list presented to : NA  Expected Discharge Plan and Services Expected Discharge Plan: Home/Self Care   Discharge Planning Services: CM Consult Post Acute Care Choice: NA Living arrangements for the past 2 months: Single Family Home Expected Discharge Date: 07/28/22                                    Prior Living Arrangements/Services Living arrangements for the past 2 months: Single Family Home Lives with:: Spouse Patient language and need for interpreter reviewed:: Yes Do you feel safe going back to the place where you live?: Yes            Criminal Activity/Legal Involvement Pertinent to Current Situation/Hospitalization: No - Comment as needed  Activities of Daily Living Home Assistive Devices/Equipment: Eyeglasses, Dentures (specify type) ADL Screening (condition at time of admission) Patient's cognitive ability  adequate to safely complete daily activities?: Yes Is the patient deaf or have difficulty hearing?: No Does the patient have difficulty seeing, even when wearing glasses/contacts?: No Does the patient have difficulty concentrating, remembering, or making decisions?: No Patient able to express need for assistance with ADLs?: Yes Does the patient have difficulty dressing or bathing?: No Independently performs ADLs?: Yes (appropriate for developmental age) Does the patient have difficulty walking or climbing stairs?: No Weakness of Legs: None Weakness of Arms/Hands: None  Permission Sought/Granted                  Emotional Assessment Appearance:: Appears stated age Attitude/Demeanor/Rapport: Engaged Affect (typically observed): Calm Orientation: : Oriented to Self, Oriented to  Time, Oriented to Place, Oriented to Situation Alcohol / Substance Use: Not Applicable Psych Involvement: No (comment)  Admission diagnosis:  Pelvic mass [R19.00] Patient Active Problem List   Diagnosis Date Noted   Pelvic mass 07/27/2022   Ovarian cyst 03/08/2022   Essential hypertension 06/11/2021   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

## 2022-07-28 NOTE — Discharge Summary (Signed)
Physician Discharge Summary  Patient ID: Debra [name] MRN: [id number] DOB/AGE: September 14, 1946 76 y.o.  Admit date: 07/27/2022 Discharge date: 07/28/2022  Admission Diagnoses: Pelvic mass  Discharge Diagnoses:  Principal Problem:   Pelvic mass   Discharged Condition:  The patient is in good condition and stable for discharge.  good  Hospital Course: The patient was admitted on 8/8 for surgery in the setting of a complex adnexal mass, elevated tumor markers. She underwent robotic-assisted laparoscopic bilateral salpingo-oophorectomy, lysis of adhesions, staging including peritoneal biopsies and omental biopsy, cystoscopy. 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 EBL for the surgery was 75cc.  In the setting of her significant cardiac history and late end time of the surgery, she was kept for observation overnight. On the morning of POD#1, she was tolerating PO without nausea or emesis, voiding freely, reporting adequate pain control, and ambulating. She denied flatus. She is discharged to home is stable condition.   Consults: None  Significant Diagnostic Studies:     Latest Ref Rng & Units 07/28/2022    3:34 AM 07/14/2022   10:33 AM 08/15/2019   11:49 AM  CBC  WBC 4.0 - 10.5 K/uL 16.4  8.3  16.2   Hemoglobin 12.0 - 15.0 g/dL 11.4  12.4  12.4   Hematocrit 36.0 - 46.0 % 35.2  38.6  36.1   Platelets 150 - 400 K/uL 201  229        Latest Ref Rng & Units 07/28/2022    3:34 AM 07/14/2022   10:33 AM 07/12/2022   11:17 AM  BMP  Glucose 70 - 99 mg/dL 175  101  103   BUN 8 - 23 mg/dL '31  30  18   '$ Creatinine 0.44 - 1.00 mg/dL 1.28  1.52  1.36   Sodium 135 - 145 mmol/L 142  144  143   Potassium 3.5 - 5.1 mmol/L 5.3  4.4  4.2   Chloride 98 - 111 mmol/L 107  106  104   CO2 22 - 32 mmol/L 28  30  34   Calcium 8.9 - 10.3 mg/dL 9.2  9.5  9.9    Treatments: Surgery as outline  above  Discharge Exam: Blood pressure (!) 127/97, pulse 64, temperature 99.3 F (37.4 C), temperature source Oral, resp. rate 16, height '5\' 2"'$  (1.575 m), weight 170 lb 13.7 oz (77.5 kg), SpO2 98 %. Gen: no acute distress, alert and oriented HEENT: normocephalic, atraumatic, anicteric sclera CV: regular rate and rhythm, no murmurs Pulm: clear to ausculation on room air, mild expiratory wheezing, no rhonchi Abd: no tenderness to palpation, nondistended, + normal bowel sounds, incisions covered with dermabond, clean and intact Ext: no edema, warm and well perfused, SCDs in placed  Disposition: Discharge disposition: 01-Home or Self Care       Discharge Instructions     Call MD for:  difficulty breathing, headache or visual disturbances   Complete by: As directed    Call MD for:  extreme fatigue   Complete by: As directed    Call MD for:  hives   Complete by: As directed    Call MD for:  persistant dizziness or light-headedness   Complete by: As directed    Call MD for:  persistant nausea and vomiting   Complete by: As directed    Call MD for:  redness, tenderness, or signs of infection (pain, swelling,  redness, odor or green/yellow discharge around incision site)   Complete by: As directed    Call MD for:  severe uncontrolled pain   Complete by: As directed    Call MD for:  temperature >100.4   Complete by: As directed    Diet - low sodium heart healthy   Complete by: As directed    Driving Restrictions   Complete by: As directed    No driving for around 1 week(s).  Do not take narcotics and drive. You need to make sure your reaction time has returned.   Increase activity slowly   Complete by: As directed    Lifting restrictions   Complete by: As directed    No lifting greater than 10 lbs, pushing, pulling, straining for 6 weeks.   Sexual Activity Restrictions   Complete by: As directed    No sexual activity, nothing in the vagina, for 4 weeks.      Allergies as of  07/28/2022   No Active Allergies      Medication List     STOP taking these medications    levofloxacin 750 MG tablet Commonly known as: LEVAQUIN       TAKE these medications    acetaminophen 325 MG tablet Commonly known as: TYLENOL Take 650 mg by mouth every 6 (six) hours as needed for moderate pain.   ALKA-SELTZER PLUS DAY/NIGHT PO Take 1 capsule by mouth 2 (two) times daily as needed (congestion).   amLODipine 5 MG tablet Commonly known as: NORVASC Take 5 mg by mouth daily.   aspirin 81 MG tablet Take 81 mg by mouth daily.   atorvastatin 80 MG tablet Commonly known as: LIPITOR Take 80 mg by mouth daily.   Breztri Aerosphere 160-9-4.8 MCG/ACT Aero Generic drug: Budeson-Glycopyrrol-Formoterol Inhale 2 puffs into the lungs 2 (two) times daily.   CALCIUM 600+D3 PO Take 1 tablet by mouth daily.   ciclopirox 0.77 % cream Commonly known as: LOPROX Apply 1 Application topically 2 (two) times daily. Apply to toenails, and feet   citalopram 20 MG tablet Commonly known as: CELEXA Take 20 mg by mouth daily.   dextromethorphan-guaiFENesin 30-600 MG 12hr tablet Commonly known as: MUCINEX DM Take 1 tablet by mouth 2 (two) times daily as needed for cough.   Tussin DM 10-100 MG/5ML liquid Generic drug: dextromethorphan-guaiFENesin Take 20 mLs by mouth daily as needed for cough.   EpiPen 2-Pak 0.3 mg/0.3 mL Soaj injection Generic drug: EPINEPHrine Inject 0.3 mg into the muscle as needed for anaphylaxis.   Fasenra 30 MG/ML Sosy Generic drug: Benralizumab Inject 30 mg into the skin every 8 (eight) weeks.   fexofenadine 60 MG tablet Commonly known as: ALLEGRA Take 60 mg by mouth daily.   Fish Oil 1000 MG Caps Take 1,000 mg by mouth daily.   fluticasone 50 MCG/ACT nasal spray Commonly known as: FLONASE Place 1 spray into both nostrils daily as needed for allergies.   furosemide 20 MG tablet Commonly known as: LASIX Take 20 mg by mouth daily as needed for  edema.   gabapentin 400 MG capsule Commonly known as: NEURONTIN Take 400 mg by mouth 3 (three) times daily.   hydrochlorothiazide 12.5 MG tablet Commonly known as: HYDRODIURIL Take 12.5 mg by mouth daily.   ipratropium 0.06 % nasal spray Commonly known as: ATROVENT Place 1 spray into both nostrils daily as needed for rhinitis.   ipratropium-albuterol 0.5-2.5 (3) MG/3ML Soln Commonly known as: DUONEB Inhale 3 mLs into the lungs every 4 (four)  hours as needed (SOB/Wheezing).   loratadine 10 MG tablet Commonly known as: CLARITIN Take 10 mg by mouth daily as needed for allergies.   metoprolol tartrate 100 MG tablet Commonly known as: LOPRESSOR Take 100 mg by mouth 2 (two) times daily.   mometasone 50 MCG/ACT nasal spray Commonly known as: NASONEX Place 2 sprays into the nose daily as needed (allergies).   montelukast 10 MG tablet Commonly known as: SINGULAIR TAKE 1 TABLET BY MOUTH EVERY DAY   Mucinex Fast-Max Congest Cough 2.5-5-100 MG/5ML Liqd Generic drug: Phenylephrine-DM-GG Take 20 mLs by mouth daily as needed (congestion).   multivitamin with minerals tablet Take 1 tablet by mouth daily.   olmesartan 20 MG tablet Commonly known as: BENICAR TAKE 1 TABLET BY MOUTH EVERY DAY   predniSONE 10 MG tablet Commonly known as: DELTASONE Take 10-40 mg by mouth See admin instructions. Take 40 mg daily for 3 days then 20 mg daily for 3 days then 10 mg daily for 3 days then stop   senna-docusate 8.6-50 MG tablet Commonly known as: Senokot-S Take 2 tablets by mouth at bedtime. For AFTER surgery, do not take if having diarrhea   Sinus Congestion/Pain Night 2-5-325 MG Tabs Generic drug: Chlorphen-Phenyleph-APAP Take 1-2 tablets by mouth daily as needed (congestion).   sodium chloride 0.65 % Soln nasal spray Commonly known as: OCEAN Place 1 spray into both nostrils as needed for congestion.   traMADol 50 MG tablet Commonly known as: ULTRAM Take 1 tablet (50 mg total) by  mouth every 6 (six) hours as needed for severe pain. For AFTER surgery only, do not take and drive   albuterol (2.5 MG/3ML) 0.083% nebulizer solution Commonly known as: PROVENTIL Take 2.5 mg by nebulization every 4 (four) hours as needed for wheezing or shortness of breath.   Ventolin HFA 108 (90 Base) MCG/ACT inhaler Generic drug: albuterol Inhale 2 puffs into the lungs every 4 (four) hours as needed for wheezing or shortness of breath.   Vitamin D (Ergocalciferol) 1.25 MG (50000 UNIT) Caps capsule Commonly known as: DRISDOL Take 50,000 Units by mouth every 30 (thirty) days.   zinc gluconate 50 MG tablet Take 50 mg by mouth daily.        Follow-up Information     Lafonda Mosses, MD Follow up on 08/04/2022.   Specialty: Gynecologic Oncology Why: at 4:40pm will be a PHONE visit with Dr. Berline Lopes to check in and discuss final path. IN PERSON visit will be on 08/19/22 at 2:30 pm at the [facility] for post-op check. Contact information: [place] [address] [phone]                 Greater than thirty minutes were spend for face to face discharge instructions and discharge orders/summary in EPIC.   Signed: Lafonda Mosses 07/28/2022, 8:24 AM

## 2022-07-29 ENCOUNTER — Telehealth: Payer: Self-pay

## 2022-07-29 LAB — SURGICAL PATHOLOGY

## 2022-07-29 LAB — CYTOLOGY - NON PAP

## 2022-07-29 NOTE — Telephone Encounter (Signed)
Spoke with Debra [name] this morning. She states she is eating, drinking and urinating well. She had a BM yesterday. She is taking senokot as prescribed and encouraged her to drink plenty of water. She denies fever or chills. Incisions are dry and intact. She rates her pain 0/10. Her pain is controlled with Tylenol 1000 mg.     Instructed to call office with any fever, chills, purulent drainage, uncontrolled pain or any other questions or concerns. Patient verbalizes understanding.   Pt aware of post op appointments as well as the office number [phone] and after hours number [phone] to call if she has any questions or concerns

## 2022-08-03 ENCOUNTER — Other Ambulatory Visit: Payer: Self-pay | Admitting: Gynecologic Oncology

## 2022-08-03 ENCOUNTER — Telehealth: Payer: Self-pay

## 2022-08-03 DIAGNOSIS — M7989 Other specified soft tissue disorders: Secondary | ICD-10-CM

## 2022-08-03 DIAGNOSIS — N83209 Unspecified ovarian cyst, unspecified side: Secondary | ICD-10-CM

## 2022-08-03 DIAGNOSIS — R978 Other abnormal tumor markers: Secondary | ICD-10-CM

## 2022-08-03 NOTE — Telephone Encounter (Signed)
FYI-Pt called today stating over the weekend, for 2 days, she had left leg and foot swelling. No calf pain, no redness and not warm to touch. She just had tingling. She thinks it is due to something she ate. She has been elevating it and drinking more water than normal. Today it has been fine with no swelling. She just wanted Dr.Tucker to know and to advise if needed.   I advised pt to eat low sodium diet and stay hydrated as much as possible.Also, continue to elevate leg as much as possible. She voiced an understanding.  Pt aware Dr.Tucker is in the OR today, however, I will notify her and call the patient back, probably tomorrow since so late in the afternoon. Pt was fine with waiting.

## 2022-08-03 NOTE — Telephone Encounter (Signed)
Per Dr.Tucker pt needs a doppler and BMP labs.   Order has been placed for both. Pt is scheduled Doppler: 08/05/22 @ 10:00am BMP: 08/05/22 @ 11:30.   Pt is aware.

## 2022-08-04 ENCOUNTER — Inpatient Hospital Stay: Payer: Medicare PPO | Attending: Gynecologic Oncology | Admitting: Gynecologic Oncology

## 2022-08-04 ENCOUNTER — Encounter: Payer: Self-pay | Admitting: Gynecologic Oncology

## 2022-08-04 DIAGNOSIS — D3911 Neoplasm of uncertain behavior of right ovary: Secondary | ICD-10-CM | POA: Insufficient documentation

## 2022-08-04 DIAGNOSIS — Z90722 Acquired absence of ovaries, bilateral: Secondary | ICD-10-CM

## 2022-08-04 DIAGNOSIS — D3912 Neoplasm of uncertain behavior of left ovary: Secondary | ICD-10-CM

## 2022-08-04 NOTE — Progress Notes (Signed)
Gynecologic Oncology Telehealth Note: Gyn-Onc  I connected with Debra [name] on 08/04/22 at  4:40 PM EDT by telephone and verified that I am speaking with the correct person using two identifiers.  I discussed the limitations, risks, security and privacy concerns of performing an evaluation and management service by telemedicine and the availability of in-person appointments. I also discussed with the patient that there may be a patient responsible charge related to this service. The patient expressed understanding and agreed to proceed.  Other persons participating in the visit and their role in the encounter: none.  Patient's location: home Provider's location: [facility]  Reason for Visit: Follow-up, treatment discussion  Treatment History: 07/27/22: Robotic BSO, LOA, staging including peritoneal biopsies and omental biopsy, cystoscopy  Interval History: Doing well.  Denies significant abdominal pain. Bowels improving. Denies any urinary symptoms. Unilateral lower extremity edema improved after elevating her leg.  Denies associated redness or pain.  Past Medical/Surgical History: Past Medical History:  Diagnosis Date   Allergic rhinitis    Arthritis    Asthma    Chronic kidney disease (CKD)    COPD (chronic obstructive pulmonary disease) ([place])    Diabetes mellitus without complication ([place])    Dyspnea    Hyperlipidemia    Hypertension    Hypertensive chronic kidney disease    Recurrent upper respiratory infection (URI)    Secondary hyperparathyroidism of renal origin ([place])     Past Surgical History:  Procedure Laterality Date   CHOLECYSTECTOMY     CORONARY ARTERY BYPASS GRAFT     PARTIAL HYSTERECTOMY  1981   ovaries left in situ, was done for fibroids   ROBOTIC ASSISTED BILATERAL SALPINGO OOPHERECTOMY Bilateral 07/27/2022   Procedure: XI ROBOTIC [place],;  Surgeon: Lafonda Mosses, MD;  Location: [facility];  Service: Gynecology;   Laterality: Bilateral;    Family History  Problem Relation Age of Onset   Stroke Mother    Cancer Father    Prostate cancer Father    Alcohol abuse Sister    Heart attack Brother    Alcohol abuse Brother    Other Brother        MVA   Asthma Daughter    Miscarriages / [place] Daughter    Diabetes Son    Allergic rhinitis Neg Hx    Eczema Neg Hx    Immunodeficiency Neg Hx    Urticaria Neg Hx    Angioedema Neg Hx    Atopy Neg Hx     Social History   Socioeconomic History   Marital status: Married    Spouse name: Not on file   Number of children: Not on file   Years of education: Not on file   Highest education level: Not on file  Occupational History   Not on file  Tobacco Use   Smoking status: Former    Types: Cigarettes    Quit date: 05/19/2012    Years since quitting: 10.2   Smokeless tobacco: Never  Vaping Use   Vaping Use: Never used  Substance and Sexual Activity   Alcohol use: No    Alcohol/week: 0.0 standard drinks of alcohol   Drug use: No   Sexual activity: Not Currently    Birth control/protection: Surgical    Comment: partial hyst  Other Topics Concern   Not on file  Social History Narrative   Not on file   Social Determinants of Health   Financial Resource Strain: Low Risk  (04/08/2022)   Overall  Financial Resource Strain (CARDIA)    Difficulty of Paying Living Expenses: Not very hard  Food Insecurity: No Food Insecurity (04/08/2022)   Hunger Vital Sign    Worried About Running Out of Food in the Last Year: Never true    Ran Out of Food in the Last Year: Never true  Transportation Needs: No Transportation Needs (04/08/2022)   PRAPARE - Hydrologist (Medical): No    Lack of Transportation (Non-Medical): No  Physical Activity: Inactive (04/08/2022)   Exercise Vital Sign    Days of Exercise per Week: 0 days    Minutes of Exercise per Session: 0 min  Stress: No Stress Concern Present (04/08/2022)   [place]    Feeling of Stress : Not at all  Social Connections: Moderately Isolated (04/08/2022)   Social Connection and Isolation Panel [NHANES]    Frequency of Communication with Friends and Family: More than three times a week    Frequency of Social Gatherings with Friends and Family: Three times a week    Attends Religious Services: Never    Active Member of Clubs or Organizations: No    Attends Archivist Meetings: Never    Marital Status: Married    Current Medications:  Current Outpatient Medications:    acetaminophen (TYLENOL) 325 MG tablet, Take 650 mg by mouth every 6 (six) hours as needed for moderate pain., Disp: , Rfl:    albuterol (PROVENTIL) (2.5 MG/3ML) 0.083% nebulizer solution, Take 2.5 mg by nebulization every 4 (four) hours as needed for wheezing or shortness of breath., Disp: , Rfl:    amLODipine (NORVASC) 5 MG tablet, Take 5 mg by mouth daily., Disp: , Rfl:    aspirin 81 MG tablet, Take 81 mg by mouth daily., Disp: , Rfl:    atorvastatin (LIPITOR) 80 MG tablet, Take 80 mg by mouth daily., Disp: , Rfl:    Benralizumab (FASENRA) 30 MG/ML SOSY, Inject 30 mg into the skin every 8 (eight) weeks., Disp: , Rfl:    Budeson-Glycopyrrol-Formoterol (BREZTRI AEROSPHERE) 160-9-4.8 MCG/ACT AERO, Inhale 2 puffs into the lungs 2 (two) times daily., Disp: 10.7 g, Rfl: 5   Calcium Carb-Cholecalciferol (CALCIUM 600+D3 PO), Take 1 tablet by mouth daily., Disp: , Rfl:    Chlorphen-Phenyleph-APAP (SINUS CONGESTION/PAIN NIGHT) 2-5-325 MG TABS, Take 1-2 tablets by mouth daily as needed (congestion)., Disp: , Rfl:    ciclopirox (LOPROX) 0.77 % cream, Apply 1 Application topically 2 (two) times daily. Apply to toenails, and feet, Disp: , Rfl:    citalopram (CELEXA) 20 MG tablet, Take 20 mg by mouth daily., Disp: , Rfl:    dextromethorphan-guaiFENesin (MUCINEX DM) 30-600 MG 12hr tablet, Take 1 tablet by mouth 2 (two) times  daily as needed for cough., Disp: , Rfl:    dextromethorphan-guaiFENesin (TUSSIN DM) 10-100 MG/5ML liquid, Take 20 mLs by mouth daily as needed for cough., Disp: , Rfl:    EPINEPHrine (EPIPEN 2-PAK) 0.3 mg/0.3 mL IJ SOAJ injection, Inject 0.3 mg into the muscle as needed for anaphylaxis., Disp: , Rfl:    fexofenadine (ALLEGRA) 60 MG tablet, Take 60 mg by mouth daily., Disp: , Rfl:    fluticasone (FLONASE) 50 MCG/ACT nasal spray, Place 1 spray into both nostrils daily as needed for allergies., Disp: , Rfl:    furosemide (LASIX) 20 MG tablet, Take 20 mg by mouth daily as needed for edema., Disp: , Rfl:    gabapentin (NEURONTIN) 400 MG capsule,  Take 400 mg by mouth 3 (three) times daily., Disp: , Rfl:    hydrochlorothiazide (HYDRODIURIL) 12.5 MG tablet, Take 12.5 mg by mouth daily., Disp: , Rfl:    ipratropium (ATROVENT) 0.06 % nasal spray, Place 1 spray into both nostrils daily as needed for rhinitis., Disp: , Rfl:    ipratropium-albuterol (DUONEB) 0.5-2.5 (3) MG/3ML SOLN, Inhale 3 mLs into the lungs every 4 (four) hours as needed (SOB/Wheezing)., Disp: , Rfl: 2   loratadine (CLARITIN) 10 MG tablet, Take 10 mg by mouth daily as needed for allergies., Disp: , Rfl:    metoprolol (LOPRESSOR) 100 MG tablet, Take 100 mg by mouth 2 (two) times daily., Disp: , Rfl:    mometasone (NASONEX) 50 MCG/ACT nasal spray, Place 2 sprays into the nose daily as needed (allergies)., Disp: , Rfl:    montelukast (SINGULAIR) 10 MG tablet, TAKE 1 TABLET BY MOUTH EVERY DAY, Disp: 90 tablet, Rfl: 0   Multiple Vitamins-Minerals (MULTIVITAMIN WITH MINERALS) tablet, Take 1 tablet by mouth daily., Disp: , Rfl:    olmesartan (BENICAR) 20 MG tablet, TAKE 1 TABLET BY MOUTH EVERY DAY, Disp: 90 tablet, Rfl: 0   Omega-3 Fatty Acids (FISH OIL) 1000 MG CAPS, Take 1,000 mg by mouth daily., Disp: , Rfl:    Phenyleph-Doxylamine-DM-APAP (ALKA-SELTZER PLUS DAY/NIGHT PO), Take 1 capsule by mouth 2 (two) times daily as needed (congestion).,  Disp: , Rfl:    Phenylephrine-DM-GG (MUCINEX FAST-MAX CONGEST COUGH) 2.5-5-100 MG/5ML LIQD, Take 20 mLs by mouth daily as needed (congestion)., Disp: , Rfl:    predniSONE (DELTASONE) 10 MG tablet, Take 10-40 mg by mouth See admin instructions. Take 40 mg daily for 3 days then 20 mg daily for 3 days then 10 mg daily for 3 days then stop, Disp: , Rfl:    senna-docusate (SENOKOT-S) 8.6-50 MG tablet, Take 2 tablets by mouth at bedtime. For AFTER surgery, do not take if having diarrhea, Disp: 30 tablet, Rfl: 0   sodium chloride (OCEAN) 0.65 % SOLN nasal spray, Place 1 spray into both nostrils as needed for congestion., Disp: , Rfl:    traMADol (ULTRAM) 50 MG tablet, Take 1 tablet (50 mg total) by mouth every 6 (six) hours as needed for severe pain. For AFTER surgery only, do not take and drive, Disp: 10 tablet, Rfl: 0   VENTOLIN HFA 108 (90 Base) MCG/ACT inhaler, Inhale 2 puffs into the lungs every 4 (four) hours as needed for wheezing or shortness of breath., Disp: 18 each, Rfl: 1   Vitamin D, Ergocalciferol, (DRISDOL) 1.25 MG (50000 UT) CAPS capsule, Take 50,000 Units by mouth every 30 (thirty) days., Disp: , Rfl:    zinc gluconate 50 MG tablet, Take 50 mg by mouth daily., Disp: , Rfl:   Current Facility-Administered Medications:    Benralizumab SOSY 30 mg, 30 mg, Subcutaneous, Q8 Weeks, Valentina Shaggy, MD, 30 mg at 06/16/22 1118  Review of Symptoms: Pertinent positives as per HPI.  Physical Exam: Deferred given limitations of phone visit.  Laboratory & Radiologic Studies: A. 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  - Suspicious for focal ovarian surface involvement (pT1c2).  - Fallopian tube, negative for borderline tumor.  - See oncology table.   B. PELVIC SIDE WALL, LEFT, BIOPSY:  - Benign adipose tissue with vascular calcifications.  - Negative for borderline tumor.   C. CUL-DE-SAC, ANTERIOR, BIOPSY:  - Benign  peritoneum.  - Negative for borderline tumor.   D. CUL-DE-SAC,  POSTERIOR, BIOPSY:  - Benign peritoneum.  - Negative for borderline tumor.   E. OVARIAN REMNANT, RIGHT, BIOPSY:  - Benign ovarian tissue.  - Negative for borderline tumor.   F. PELVIC SIDE WALL, RIGHT, BIOPSY:  - Benign peritoneum.  - Negative for borderline tumor.   G. PARACOLIC GUTTER, RIGHT, BIOPSY:  - Benign peritoneum.  - Negative for borderline tumor.   H. PARACOLIC GUTTER, LEFT, BIOPSY:  - Benign peritoneum.  - Negative for borderline tumor.   I. FALLOPIAN TUBE AND OVARY, LEFT, RESECTION:  - Benign ovary with serous adenofibroma, negative for borderline tumor.  - Benign fallopian tube, negative for borderline tumor.   J. OMENTUM, RESECTION:  - Benign omental adipose tissue.  - Negative for borderline tumor.   ONCOLOGY TABLE:  OVARY or FALLOPIAN TUBE: Resection  Procedure: Bilateral salpingo-oophorectomy with omentum and peritoneal  staging biopsies  Specimen Integrity: Disrupted  Tumor Site: Right ovary  Tumor Size: 5.6 x 5.2 x 2.1 cm aggregate tissue  Histologic Type: Serous borderline tumor  Histologic Grade: Low-grade  Ovarian Surface Involvement: Suspicious, see comment  Fallopian Tube Surface Involvement: Not identified  Implants): Not identified  Other Tissue/ Organ Involvement: Not applicable  Largest Extrapelvic Peritoneal Focus: Not applicable  Peritoneal/Ascitic Fluid Involvement: Benign [facility] [phone])  Chemotherapy Response Score (CRS): Not applicable  Regional Lymph Nodes: No lymph nodes submitted  Pathologic Stage Classification (pTNM, AJCC 8th Edition): pT1c2, pN not  assigned  Ancillary Studies: Can be performed if requested  Representative Tumor Block: A1 and A8  Comment(s): The right ovary is received disrupted with somewhat limits  evaluation for ovarian surface involvement; however, there is a small  focus consistent with ovarian serosa which is focally involved with   borderline tumor with psammoma bodies.  (v1.3.0.1)   FINAL MICROSCOPIC DIAGNOSIS:  - No malignant cells identified   Assessment & Plan: Debra [name] is a 76 y.o. 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  Doing well, meeting postoperative milestones.  Reviewed pathology from surgery which showed a borderline tumor of the ovary.  Discussed treatment and possible surgical staging that is already been completed.  Discussed surveillance plan further at her follow-up visit.  All questions answered.  I discussed the assessment and treatment plan with the patient. The patient was provided with an opportunity to ask questions and all were answered. The patient agreed with the plan and demonstrated an understanding of the instructions.   The patient was advised to call back or see an in-person evaluation if the symptoms worsen or if the condition fails to improve as anticipated.   8 minutes of total time was spent for this patient encounter, including preparation, phone counseling with the patient and coordination of care, and documentation of the encounter.   Jeral Pinch, MD  Division of Gynecologic Oncology  Department of Obstetrics and Gynecology  [facility]

## 2022-08-05 ENCOUNTER — Ambulatory Visit (HOSPITAL_COMMUNITY)
Admission: RE | Admit: 2022-08-05 | Discharge: 2022-08-05 | Disposition: A | Discharge status: Home / Self Care | Payer: Medicare PPO | Source: Ambulatory Visit | Attending: Gynecologic Oncology | Admitting: Gynecologic Oncology

## 2022-08-05 ENCOUNTER — Inpatient Hospital Stay: Payer: Medicare PPO

## 2022-08-05 ENCOUNTER — Telehealth: Payer: Self-pay

## 2022-08-05 DIAGNOSIS — M7989 Other specified soft tissue disorders: Secondary | ICD-10-CM

## 2022-08-05 DIAGNOSIS — R978 Other abnormal tumor markers: Secondary | ICD-10-CM

## 2022-08-05 DIAGNOSIS — N83209 Unspecified ovarian cyst, unspecified side: Secondary | ICD-10-CM

## 2022-08-05 DIAGNOSIS — D3911 Neoplasm of uncertain behavior of right ovary: Secondary | ICD-10-CM | POA: Diagnosis present

## 2022-08-05 DIAGNOSIS — Z90722 Acquired absence of ovaries, bilateral: Secondary | ICD-10-CM | POA: Diagnosis not present

## 2022-08-05 LAB — BASIC METABOLIC PANEL
Anion gap: 2 — ABNORMAL LOW (ref 5–15)
BUN: 20 mg/dL (ref 8–23)
CO2: 34 mmol/L — ABNORMAL HIGH (ref 22–32)
Calcium: 9.4 mg/dL (ref 8.9–10.3)
Chloride: 105 mmol/L (ref 98–111)
Creatinine, Ser: 1.32 mg/dL — ABNORMAL HIGH (ref 0.44–1.00)
GFR, Estimated: 42 mL/min — ABNORMAL LOW (ref >60)
Glucose, Bld: 92 mg/dL (ref 70–99)
Potassium: 4.6 mmol/L (ref 3.5–5.1)
Sodium: 141 mmol/L (ref 135–145)

## 2022-08-05 NOTE — Telephone Encounter (Signed)
[facility] department called report on Ms. Shreffler. Negative for DVT bilateral Joylene John NP and Dr. Berline Lopes notified

## 2022-08-05 NOTE — Telephone Encounter (Signed)
Pt is aware of results for the BMET is overall stable. Notified her of the doppler being negative as well. She was thankful for the call and states she is feeling better.   Reminded of post-op appointment for 8/31 @ 2:30

## 2022-08-05 NOTE — Progress Notes (Signed)
Bilateral lower extremity venous duplex completed. Refer to "CV Proc" under chart review to view preliminary results.  08/05/2022 10:47 AM Kelby Aline., MHA, RVT, RDCS, RDMS

## 2022-08-09 ENCOUNTER — Ambulatory Visit (INDEPENDENT_AMBULATORY_CARE_PROVIDER_SITE_OTHER): Payer: Medicare PPO

## 2022-08-09 DIAGNOSIS — J4550 Severe persistent asthma, uncomplicated: Secondary | ICD-10-CM | POA: Diagnosis not present

## 2022-08-18 ENCOUNTER — Encounter: Payer: Self-pay | Admitting: Gynecologic Oncology

## 2022-08-18 NOTE — Progress Notes (Unsigned)
Gynecologic Oncology Return Clinic Visit  08/19/22  Reason for Visit: follow-up after surgery, treatment planning  Treatment History: 06/17/22: CA-125 34.8, HE4 158. CEA 2.5. 07/21/22: CT A/P shows 4.6 cm complex cystic mass in right ovary. No lymphadenopathy or ascites. No metastatic disease.  07/27/22: Robotic BSO, LOA, staging including peritoneal biopsies and omental biopsy, cystoscopy  Interval History: Doing well.  Denies any significant abdominal pain.  Reports baseline bowel and bladder function.  Continues to use bowel regimen.  Reports good appetite without nausea or emesis.  Past Medical/Surgical History: Past Medical History:  Diagnosis Date   Allergic rhinitis    Arthritis    Asthma    Chronic kidney disease (CKD)    COPD (chronic obstructive pulmonary disease) ([place])    Diabetes mellitus without complication ([place])    Dyspnea    Hyperlipidemia    Hypertension    Hypertensive chronic kidney disease    Recurrent upper respiratory infection (URI)    Secondary hyperparathyroidism of renal origin ([place])     Past Surgical History:  Procedure Laterality Date   CHOLECYSTECTOMY     CORONARY ARTERY BYPASS GRAFT     PARTIAL HYSTERECTOMY  1981   ovaries left in situ, was done for fibroids   ROBOTIC ASSISTED BILATERAL SALPINGO OOPHERECTOMY Bilateral 07/27/2022   Procedure: XI ROBOTIC [place],;  Surgeon: Lafonda Mosses, MD;  Location: [facility];  Service: Gynecology;  Laterality: Bilateral;    Family History  Problem Relation Age of Onset   Stroke Mother    Cancer Father    Prostate cancer Father    Alcohol abuse Sister    Heart attack Brother    Alcohol abuse Brother    Other Brother        MVA   Asthma Daughter    Miscarriages / [place] Daughter    Diabetes Son    Allergic rhinitis Neg Hx    Eczema Neg Hx    Immunodeficiency Neg Hx    Urticaria Neg Hx    Angioedema Neg Hx    Atopy Neg Hx     Social History    Socioeconomic History   Marital status: Married    Spouse name: Not on file   Number of children: Not on file   Years of education: Not on file   Highest education level: Not on file  Occupational History   Not on file  Tobacco Use   Smoking status: Former    Types: Cigarettes    Quit date: 05/19/2012    Years since quitting: 10.2   Smokeless tobacco: Never  Vaping Use   Vaping Use: Never used  Substance and Sexual Activity   Alcohol use: No    Alcohol/week: 0.0 standard drinks of alcohol   Drug use: No   Sexual activity: Not Currently    Birth control/protection: Surgical    Comment: partial hyst  Other Topics Concern   Not on file  Social History Narrative   Not on file   Social Determinants of Health   Financial Resource Strain: Low Risk  (04/08/2022)   Overall Financial Resource Strain (CARDIA)    Difficulty of Paying Living Expenses: Not very hard  Food Insecurity: No Food Insecurity (04/08/2022)   Hunger Vital Sign    Worried About Running Out of Food in the Last Year: Never true    Ran Out of Food in the Last Year: Never true  Transportation Needs: No Transportation Needs (04/08/2022)   [place] - Transportation  Lack of Transportation (Medical): No    Lack of Transportation (Non-Medical): No  Physical Activity: Inactive (04/08/2022)   Exercise Vital Sign    Days of Exercise per Week: 0 days    Minutes of Exercise per Session: 0 min  Stress: No Stress Concern Present (04/08/2022)   [place]    Feeling of Stress : Not at all  Social Connections: Moderately Isolated (04/08/2022)   Social Connection and Isolation Panel [NHANES]    Frequency of Communication with Friends and Family: More than three times a week    Frequency of Social Gatherings with Friends and Family: Three times a week    Attends Religious Services: Never    Active Member of Clubs or Organizations: No    Attends Theatre manager Meetings: Never    Marital Status: Married    Current Medications:  Current Outpatient Medications:    acetaminophen (TYLENOL) 325 MG tablet, Take 650 mg by mouth every 6 (six) hours as needed for moderate pain., Disp: , Rfl:    albuterol (PROVENTIL) (2.5 MG/3ML) 0.083% nebulizer solution, Take 2.5 mg by nebulization every 4 (four) hours as needed for wheezing or shortness of breath., Disp: , Rfl:    amLODipine (NORVASC) 5 MG tablet, Take 5 mg by mouth daily., Disp: , Rfl:    aspirin 81 MG tablet, Take 81 mg by mouth daily., Disp: , Rfl:    atorvastatin (LIPITOR) 80 MG tablet, Take 80 mg by mouth daily., Disp: , Rfl:    Benralizumab (FASENRA) 30 MG/ML SOSY, Inject 30 mg into the skin every 8 (eight) weeks., Disp: , Rfl:    Budeson-Glycopyrrol-Formoterol (BREZTRI AEROSPHERE) 160-9-4.8 MCG/ACT AERO, Inhale 2 puffs into the lungs 2 (two) times daily., Disp: 10.7 g, Rfl: 5   Calcium Carb-Cholecalciferol (CALCIUM 600+D3 PO), Take 1 tablet by mouth daily., Disp: , Rfl:    Chlorphen-Phenyleph-APAP (SINUS CONGESTION/PAIN NIGHT) 2-5-325 MG TABS, Take 1-2 tablets by mouth daily as needed (congestion)., Disp: , Rfl:    ciclopirox (LOPROX) 0.77 % cream, Apply 1 Application topically 2 (two) times daily. Apply to toenails, and feet, Disp: , Rfl:    citalopram (CELEXA) 20 MG tablet, Take 20 mg by mouth daily., Disp: , Rfl:    dextromethorphan-guaiFENesin (MUCINEX DM) 30-600 MG 12hr tablet, Take 1 tablet by mouth 2 (two) times daily as needed for cough., Disp: , Rfl:    dextromethorphan-guaiFENesin (TUSSIN DM) 10-100 MG/5ML liquid, Take 20 mLs by mouth daily as needed for cough., Disp: , Rfl:    diclofenac Sodium (VOLTAREN) 1 % GEL, as directed Externally, Disp: , Rfl:    EPINEPHrine (EPIPEN 2-PAK) 0.3 mg/0.3 mL IJ SOAJ injection, Inject 0.3 mg into the muscle as needed for anaphylaxis., Disp: , Rfl:    fexofenadine (ALLEGRA) 60 MG tablet, Take 60 mg by mouth daily., Disp: , Rfl:    fluticasone  (FLONASE) 50 MCG/ACT nasal spray, Place 1 spray into both nostrils daily as needed for allergies., Disp: , Rfl:    furosemide (LASIX) 20 MG tablet, Take 20 mg by mouth daily as needed for edema., Disp: , Rfl:    gabapentin (NEURONTIN) 400 MG capsule, Take 400 mg by mouth 3 (three) times daily., Disp: , Rfl:    hydrochlorothiazide (HYDRODIURIL) 12.5 MG tablet, Take 12.5 mg by mouth daily., Disp: , Rfl:    ipratropium (ATROVENT) 0.06 % nasal spray, Place 1 spray into both nostrils daily as needed for rhinitis., Disp: , Rfl:  ipratropium-albuterol (DUONEB) 0.5-2.5 (3) MG/3ML SOLN, Inhale 3 mLs into the lungs every 4 (four) hours as needed (SOB/Wheezing)., Disp: , Rfl: 2   loratadine (CLARITIN) 10 MG tablet, Take 10 mg by mouth daily as needed for allergies., Disp: , Rfl:    metoprolol (LOPRESSOR) 100 MG tablet, Take 100 mg by mouth 2 (two) times daily., Disp: , Rfl:    mometasone (NASONEX) 50 MCG/ACT nasal spray, Place 2 sprays into the nose daily as needed (allergies)., Disp: , Rfl:    montelukast (SINGULAIR) 10 MG tablet, TAKE 1 TABLET BY MOUTH EVERY DAY, Disp: 90 tablet, Rfl: 0   Multiple Vitamins-Minerals (MULTIVITAMIN WITH MINERALS) tablet, Take 1 tablet by mouth daily., Disp: , Rfl:    olmesartan (BENICAR) 20 MG tablet, TAKE 1 TABLET BY MOUTH EVERY DAY, Disp: 90 tablet, Rfl: 0   Omega-3 Fatty Acids (FISH OIL) 1000 MG CAPS, Take 1,000 mg by mouth daily., Disp: , Rfl:    Phenyleph-Doxylamine-DM-APAP (ALKA-SELTZER PLUS DAY/NIGHT PO), Take 1 capsule by mouth 2 (two) times daily as needed (congestion)., Disp: , Rfl:    Phenylephrine-DM-GG (MUCINEX FAST-MAX CONGEST COUGH) 2.5-5-100 MG/5ML LIQD, Take 20 mLs by mouth daily as needed (congestion)., Disp: , Rfl:    sodium chloride (OCEAN) 0.65 % SOLN nasal spray, Place 1 spray into both nostrils as needed for congestion., Disp: , Rfl:    VENTOLIN HFA 108 (90 Base) MCG/ACT inhaler, Inhale 2 puffs into the lungs every 4 (four) hours as needed for wheezing  or shortness of breath., Disp: 18 each, Rfl: 1   Vitamin D, Ergocalciferol, (DRISDOL) 1.25 MG (50000 UT) CAPS capsule, Take 50,000 Units by mouth every 30 (thirty) days., Disp: , Rfl:    zinc gluconate 50 MG tablet, Take 50 mg by mouth daily., Disp: , Rfl:   Current Facility-Administered Medications:    Benralizumab SOSY 30 mg, 30 mg, Subcutaneous, Q8 Weeks, Valentina Shaggy, MD, 30 mg at 08/09/22 1212  Review of Systems: + Shortness of breath, wheezing, hot flashes, back pain, itching. Denies appetite changes, fevers, chills, fatigue, unexplained weight changes. Denies hearing loss, neck lumps or masses, mouth sores, ringing in ears or voice changes. Denies cough.  Denies chest pain or palpitations. Denies leg swelling. Denies abdominal distention, pain, blood in stools, constipation, diarrhea, nausea, vomiting, or early satiety. Denies pain with intercourse, dysuria, frequency, hematuria or incontinence. Denies pelvic pain, vaginal bleeding or vaginal discharge.   Denies joint pain or muscle pain/cramps. Denies rash, or wounds. Denies dizziness, headaches, numbness or seizures. Denies swollen lymph nodes or glands, denies easy bruising or bleeding. Denies anxiety, depression, confusion, or decreased concentration.  Physical Exam: BP 117/70 (BP Location: Left Arm, Patient Position: Sitting)   Pulse (!) 53   Temp 98.5 F (36.9 C) (Oral)   Resp 16   Ht '5\' 2"'$  (1.575 m)   Wt 174 lb 12.8 oz (79.3 kg)   SpO2 96%   BMI 31.97 kg/m  General: Alert, oriented, no acute distress. HEENT: Atraumatic, normocephalic, sclera anicteric. Chest: Unlabored breathing on room air. Abdomen: soft, nontender, mildly distended.  Normoactive bowel sounds.  No masses or hepatosplenomegaly appreciated.  Well-healed incisions. Extremities: Grossly normal range of motion.  Warm, well perfused.  No edema bilaterally.  Laboratory & Radiologic Studies: A. 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  - Suspicious for focal ovarian surface involvement (pT1c2).  - Fallopian tube, negative for borderline tumor.  - See oncology table.   B.  PELVIC SIDE WALL, LEFT, BIOPSY:  - Benign adipose tissue with vascular calcifications.  - Negative for borderline tumor.   C. CUL-DE-SAC, ANTERIOR, BIOPSY:  - Benign peritoneum.  - Negative for borderline tumor.   D. CUL-DE-SAC, POSTERIOR, BIOPSY:  - Benign peritoneum.  - Negative for borderline tumor.   E. OVARIAN REMNANT, RIGHT, BIOPSY:  - Benign ovarian tissue.  - Negative for borderline tumor.   F. PELVIC SIDE WALL, RIGHT, BIOPSY:  - Benign peritoneum.  - Negative for borderline tumor.   G. PARACOLIC GUTTER, RIGHT, BIOPSY:  - Benign peritoneum.  - Negative for borderline tumor.   H. PARACOLIC GUTTER, LEFT, BIOPSY:  - Benign peritoneum.  - Negative for borderline tumor.   I. FALLOPIAN TUBE AND OVARY, LEFT, RESECTION:  - Benign ovary with serous adenofibroma, negative for borderline tumor.  - Benign fallopian tube, negative for borderline tumor.   J. OMENTUM, RESECTION:  - Benign omental adipose tissue.  - Negative for borderline tumor.   ONCOLOGY TABLE:  OVARY or FALLOPIAN TUBE: Resection  Procedure: Bilateral salpingo-oophorectomy with omentum and peritoneal  staging biopsies  Specimen Integrity: Disrupted  Tumor Site: Right ovary  Tumor Size: 5.6 x 5.2 x 2.1 cm aggregate tissue  Histologic Type: Serous borderline tumor  Histologic Grade: Low-grade  Ovarian Surface Involvement: Suspicious, see comment  Fallopian Tube Surface Involvement: Not identified  Implants): Not identified  Other Tissue/ Organ Involvement: Not applicable  Largest Extrapelvic Peritoneal Focus: Not applicable  Peritoneal/Ascitic Fluid Involvement: Benign [facility] [phone])  Chemotherapy Response Score (CRS): Not applicable  Regional Lymph Nodes: No lymph nodes submitted  Pathologic Stage  Classification (pTNM, AJCC 8th Edition): pT1c2, pN not  assigned  Ancillary Studies: Can be performed if requested  Representative Tumor Block: A1 and A8  Comment(s): The right ovary is received disrupted with somewhat limits  evaluation for ovarian surface involvement; however, there is a small  focus consistent with ovarian serosa which is focally involved with  borderline tumor with psammoma bodies.  (v1.3.0.1)    FINAL MICROSCOPIC DIAGNOSIS:  - No malignant cells identified   Assessment & Plan: Debra [name] is a 76 y.o. 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  Patient is overall doing well and meeting postoperative milestones.  Discussed modification to her bowel regimen given her bloating and increased bowel function.  Discussed continued postoperative restrictions and expectations.  We reviewed final pathology again.  Patient was given a copy of her pathology report.  We discussed the diagnosis of a borderline tumor and its prognosis, especially given early stage.  We discussed the risk of recurrence and that this is why long-term surveillance is encouraged.  I also reviewed the rare risk of recurrence as an invasive carcinoma.  I also discussed that there are no universal guidelines in terms of how frequently to follow patients with a history of borderline tumor.  We discussed that this can be as frequently as we would follow-up patient with an ovarian cancer diagnosis or as infrequently as every year (per SGO guidelines) after completion surgery.  I offered that we could start with visits every 6 months and ultimately will transition to yearly visits.  I will see the patient for her first follow-up visit and we will discuss the possibility of alternating visits with her OB/GYN at a future time.  The patient was asked to call at the end of the year or in January to schedule a visit to see me  in 6  months.  We reviewed signs and symptoms that would be concerning for recurrence of her borderline tumor.  22 minutes of total time was spent for this patient encounter, including preparation, face-to-face counseling with the patient and coordination of care, and documentation of the encounter.  Jeral Pinch, MD  Division of Gynecologic Oncology  Department of Obstetrics and Gynecology  [facility]

## 2022-08-19 ENCOUNTER — Encounter: Payer: Self-pay | Admitting: Gynecologic Oncology

## 2022-08-19 ENCOUNTER — Inpatient Hospital Stay (HOSPITAL_BASED_OUTPATIENT_CLINIC_OR_DEPARTMENT_OTHER): Payer: Medicare PPO | Admitting: Gynecologic Oncology

## 2022-08-19 VITALS — BP 117/70 | HR 53 | Temp 98.5°F | Resp 16 | Ht 62.0 in | Wt 174.8 lb

## 2022-08-19 DIAGNOSIS — Z90722 Acquired absence of ovaries, bilateral: Secondary | ICD-10-CM

## 2022-08-19 DIAGNOSIS — D3912 Neoplasm of uncertain behavior of left ovary: Secondary | ICD-10-CM

## 2022-08-19 DIAGNOSIS — Z7189 Other specified counseling: Secondary | ICD-10-CM

## 2022-08-19 NOTE — Patient Instructions (Signed)
It was good to see you today.  You are healing well from surgery.  Remember, no heavy lifting for 6-8 weeks after surgery.  We discussed your pathology today again.  Based on the final results, you had an early stage borderline tumor of the ovary.  This is like a precancer of the ovary.  Because it has a risk of recurrence, and rarely comes back as a cancer, we follow you more closely.  We will plan on visits every 6 months initially.  Please call my office sometime in December or after the new year to schedule a visit to see me in February or March.  As we talked about today, if you develop any new and concerning symptoms, such as abdominal pain, bloating, decreased appetite, unintentional weight loss, or constipation, please call to see me sooner.

## 2022-08-25 ENCOUNTER — Ambulatory Visit: Payer: Medicare PPO | Admitting: Allergy & Immunology

## 2022-09-17 ENCOUNTER — Ambulatory Visit (INDEPENDENT_AMBULATORY_CARE_PROVIDER_SITE_OTHER): Payer: Medicare PPO | Admitting: Allergy & Immunology

## 2022-09-17 ENCOUNTER — Encounter: Payer: Self-pay | Admitting: Allergy & Immunology

## 2022-09-17 VITALS — BP 130/60 | HR 61 | Temp 97.9°F | Resp 18

## 2022-09-17 DIAGNOSIS — B999 Unspecified infectious disease: Secondary | ICD-10-CM

## 2022-09-17 DIAGNOSIS — J3089 Other allergic rhinitis: Secondary | ICD-10-CM | POA: Diagnosis not present

## 2022-09-17 DIAGNOSIS — J4489 Other specified chronic obstructive pulmonary disease: Secondary | ICD-10-CM

## 2022-09-17 DIAGNOSIS — J449 Chronic obstructive pulmonary disease, unspecified: Secondary | ICD-10-CM | POA: Diagnosis not present

## 2022-09-17 MED ORDER — MONTELUKAST SODIUM 10 MG PO TABS: 10.0000 mg | ORAL_TABLET | Freq: Every day | ORAL | 1 refills | Status: DC

## 2022-09-17 MED ORDER — BREZTRI AEROSPHERE 160-9-4.8 MCG/ACT IN AERO
2.0000 | INHALATION_SPRAY | Freq: Two times a day (BID) | RESPIRATORY_TRACT | 5 refills | Status: DC
Start: 2022-09-17 — End: 2023-11-02

## 2022-09-17 MED ORDER — VENTOLIN HFA 108 (90 BASE) MCG/ACT IN AERS: 2.0000 | INHALATION_SPRAY | RESPIRATORY_TRACT | 1 refills | Status: DC | PRN

## 2022-09-17 NOTE — Patient Instructions (Addendum)
1. Asthma-COPD overlap syndrome - Lung testing looks stable today, actually a bit better.  - Daily controller medication(s): Fasenra '30mg'$  every 8 weeks and Breztri two puffs twice daily with spacer - Prior to physical activity: ProAir 2 puffs 10-15 minutes before physical activity. - Rescue medications: ProAir 4 puffs every 4-6 hours as needed - Asthma control goals:  * Full participation in all desired activities (may need albuterol before activity) * Albuterol use two time or less a week on average (not counting use with activity) * Cough interfering with sleep two time or less a month * Oral steroids no more than once a year * No hospitalizations  2. Perennial allergic rhinitis - Continue with Allegra one tablet daily (you can INCREASE to twice daily)  - Continue with Flonase one spray per nostril daily.  - Limit the use of the Flonase tablets since they contain a medication that can raise your blood pressure!  - Continue with nasal saline rinses as needed.  3. Return in about 6 months (around 03/18/2023).    Please inform us of any Emergency Department visits, hospitalizations, or changes in symptoms. Call us before going to the ED for breathing or allergy symptoms since we might be able to fit you in for a sick visit. Feel free to contact us anytime with any questions, problems, or concerns.  It was a pleasure to see you again today!  Websites that have reliable patient information: 1. American Academy of Asthma, Allergy, and Immunology: www.aaaai.org 2. Food Allergy Research and Education (FARE): foodallergy.org 3. Mothers of Asthmatics: http://www.asthmacommunitynetwork.org 4. American College of Allergy, Asthma, and Immunology: www.acaai.org   COVID-19 Vaccine Information can be found at: ShippingScam.co.uk For questions related to vaccine distribution or appointments, please email [email or web address] or call  [phone].   We realize that you might be concerned about having an allergic reaction to the COVID19 vaccines. To help with that concern, WE ARE OFFERING THE COVID19 VACCINES IN OUR OFFICE! Ask the front desk for dates!     "Like" [place] on Facebook and Instagram for our latest updates!      A healthy democracy works best when [employer] participate! Make sure you are registered to vote! If you have moved or changed any of your contact information, you will need to get this updated before voting!  In some cases, you MAY be able to register to vote online: [email or web address]

## 2022-09-17 NOTE — Progress Notes (Signed)
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 Asthma-COPD overlap syndrome - Lung testing looks stable today, actually a bit better.  - Daily controller medication(s): Fasenra '30mg'$  every 8 weeks and Breztri two puffs twice daily with spacer - Prior to physical activity: ProAir 2 puffs 10-15 minutes before physical activity. - Rescue medications: ProAir 4 puffs every 4-6 hours as needed - Asthma control goals:  * Full participation in all desired activities (may need albuterol before activity) * Albuterol use two time or less a week on average (not counting use with activity) * Cough interfering with sleep two time or less a month * Oral steroids no more than once a year * No hospitalizations  2. Perennial allergic rhinitis - Continue with Allegra one tablet daily (you can INCREASE to twice daily)  - Continue with Flonase one spray per nostril daily.  - Limit the use of the Flonase tablets since they contain a medication that can raise your blood pressure!  - Continue with nasal saline rinses as needed.  3. Return in about 6 months (around 03/18/2023).    Subjective:   Debra [name] is a 76 y.o. female presenting today for follow up of  Chief Complaint  Patient presents with   Follow-up    Debra [name] has a history of the following: Patient Active Problem List   Diagnosis Date Noted   Pelvic mass 07/27/2022   Ovarian cyst 03/08/2022   Essential hypertension 06/11/2021   Asthma-COPD overlap syndrome ([place]) 09/28/2016   Perennial allergic rhinitis 09/28/2016    History obtained from: chart review and  patient.  Debra [name] is a 76 y.o. female presenting for a follow up visit.  She was last seen in March 2023.  At that time, we worked on getting him [place] with [place] and [place].  We continued with Berna Bue every 8 weeks as well as Breztri 2 puffs twice daily.  For her rhinitis, we continued with Allegra daily as well as Flonase daily.  Since last visit, she has largely done well.  Asthma/Respiratory Symptom History: She remains on her Breztri two puffs twice daily. She gets it through [place] [facility]. She is also on the [place] every 8 weeks. She has not needed prednisone since her last visit. She reports that her arthritis in her back is acting up. This is managed by Dr. Megan Salon in [place]. Fromt a breathing perspective she is doing well.  Her breathing really has been quite stable.   Allergic Rhinitis Symptom History: She is still on Allegra. She is on Flonase. She has bene using the Flonase tablets (shown below), although she does not use this on a daily basis.  She was not aware that this  could affect her blood pressure at all, but she will continue to monitor this.       She did have surgery in August.  They removed a cyst on her ovary.  This was a outpatient procedure.  She was spoiled a bit after the surgery.  Otherwise, there have been no changes to her past medical history, surgical history, family history, or social history.    Review of Systems  Constitutional: Negative.  Negative for chills, fever, malaise/fatigue and weight loss.  HENT: Negative.  Negative for congestion, ear discharge, ear pain, sinus pain and sore throat.        Positive for rhinorrhea with postnasal drip. Positive for throat clearing.   Eyes:  Negative for pain, discharge and redness.  Respiratory:  Negative for cough, sputum production, shortness of breath and wheezing.   Cardiovascular: Negative.  Negative for chest pain and palpitations.  Gastrointestinal:  Negative for abdominal pain, constipation, diarrhea, heartburn,  nausea and vomiting.  Skin: Negative.  Negative for itching and rash.  Neurological:  Negative for dizziness and headaches.  Endo/Heme/Allergies:  Positive for environmental allergies. Does not bruise/bleed easily.       Objective:   Blood pressure 130/60, pulse 61, temperature 97.9 F (36.6 C), temperature source Temporal, resp. rate 18, SpO2 98 %. There is no height or weight on file to calculate BMI.    Physical Exam Vitals reviewed.  Constitutional:      Appearance: She is well-developed.     Comments: Pleasant female.  Very talkative as always.  Full of energy.  HENT:     Head: Normocephalic and atraumatic.     Right Ear: Tympanic membrane, ear canal and external ear normal.     Left Ear: Tympanic membrane, ear canal and external ear normal.     Nose: No nasal deformity, septal deviation, mucosal edema or rhinorrhea.     Right Turbinates: Enlarged, swollen and pale.     Left Turbinates: Enlarged, swollen and pale.     Right Sinus: No maxillary sinus tenderness or frontal sinus tenderness.     Left Sinus: No maxillary sinus tenderness or frontal sinus tenderness.     Mouth/Throat:     Mouth: Mucous membranes are not pale and not dry.     Pharynx: Uvula midline.  Eyes:     General: Lids are normal. No allergic shiner.       Right eye: No discharge.        Left eye: No discharge.     Conjunctiva/sclera: Conjunctivae normal.     Right eye: Right conjunctiva is not injected. No chemosis.    Left eye: Left conjunctiva is not injected. No chemosis.    Pupils: Pupils are equal, round, and reactive to light.  Cardiovascular:     Rate and Rhythm: Normal rate and regular rhythm.     Heart sounds: Normal heart sounds.  Pulmonary:     Effort: Pulmonary effort is normal. No tachypnea, accessory muscle usage or respiratory distress.     Breath sounds: Normal breath sounds. No wheezing, rhonchi or rales.     Comments: Moving air well in all lung fields. No increased work of  breathing noted.  Chest:     Chest wall: No tenderness.  Lymphadenopathy:     Cervical: No cervical adenopathy.  Skin:    General: Skin is warm.     Capillary Refill: Capillary refill takes less than 2 seconds.     Coloration: Skin is not pale.     Findings: No abrasion, erythema, petechiae or rash. Rash is not papular, urticarial or vesicular.  Neurological:     Mental Status: She is alert.  Psychiatric:        Behavior: Behavior is cooperative.      Diagnostic studies:    Spirometry: results abnormal (FEV1: 0.75/45%, FVC: 1.00/47%, FEV1/FVC: 75%).    Spirometry consistent with possible restrictive disease.  Her last pulmonary function testing was in  2019 and they were unable to do a DLCO at that time.  He did have moderately reduced capacity and obstruction.   Allergy Studies: none       Salvatore Marvel, MD  [facility] [place] [facility] [place]

## 2022-09-27 ENCOUNTER — Ambulatory Visit: Payer: Medicare PPO | Admitting: Internal Medicine

## 2022-10-06 ENCOUNTER — Ambulatory Visit (INDEPENDENT_AMBULATORY_CARE_PROVIDER_SITE_OTHER): Payer: Medicare PPO

## 2022-10-06 DIAGNOSIS — J4550 Severe persistent asthma, uncomplicated: Secondary | ICD-10-CM | POA: Diagnosis not present

## 2022-10-13 ENCOUNTER — Ambulatory Visit (HOSPITAL_COMMUNITY)
Admission: RE | Admit: 2022-10-13 | Discharge: 2022-10-13 | Disposition: A | Discharge status: Home / Self Care | Payer: Medicare PPO | Source: Ambulatory Visit | Attending: Urology | Admitting: Urology

## 2022-10-13 DIAGNOSIS — N281 Cyst of kidney, acquired: Secondary | ICD-10-CM | POA: Insufficient documentation

## 2022-10-15 ENCOUNTER — Ambulatory Visit: Payer: Medicare PPO | Admitting: Internal Medicine

## 2022-10-15 ENCOUNTER — Encounter: Payer: Self-pay | Admitting: Internal Medicine

## 2022-10-15 DIAGNOSIS — J4489 Other specified chronic obstructive pulmonary disease: Secondary | ICD-10-CM

## 2022-10-15 NOTE — Progress Notes (Signed)
Debra [name], female    DOB: Feb 23, 1946   MRN: [id number]   Brief patient profile:  63 yobf quit smoking 2013 with onset of rhinitis/ asthma in her 49s better since rx fosenra from [facility] 2021 prev under care of Debra [name] referred to pulmonary clinic in [place]  06/11/2021 by [place]      History of Present Illness  06/11/2021  Pulmonary/ 1st office eval/ Debra [name] / [place] [facility]  maint on breztri 2 bid / acei and prednisone per arthritis doctor/ fasenra per Dr Ernst Bowler Chief Complaint  Patient presents with   Consult    Patient reports she was diagnosed with COPD 7 years ago, 04/2012. She reports she may take a while to get some sleep.    Dyspnea:  limited by knees > sob/ able to weed eat and do most yard work  Cough: none  Sleep: sleep flat bed/ one pillow  SABA use: last used 6/20 when got hot assoc with subjective wheeze   Still on prednisone per Dr Megan Salon for arthritis  Rec Plan A = Automatic = Always=    breztri Take 2 puffs first thing in am and then another 2 puffs about 12 hours later.   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   Dyspnea:  mostly if lifts something heavy  Cough: 2 weeks worse with nasal congestion  Sleeping: bed is flat/ one pillow  SABA use: rarely  02: none  Covid status: all vax Rec Work on inhaler technique:   Please schedule  a follow up visit in 3- 4 months but call sooner if needed for PFTs same day (or a day when gets fasenra from Dr Ernst Bowler    10/15/2022  f/u ov/[place] office/Debra [name] re: ACOS  maint on breztri / singular/fosenra   Chief Complaint  Patient presents with   Follow-up    Breathing is doing well since last ov   Dyspnea:  hc parking into store / [employer]/  sometimes legs give out but not the breathing  Cough: none  Sleeping: flat bed one pillow no resp cc  SABA use: rarely  02: none      No obvious day to day or daytime variability or assoc excess/ purulent sputum or mucus plugs or hemoptysis or cp or chest tightness, subjective wheeze or overt sinus or hb symptoms.   Sleeping  without nocturnal  or early am exacerbation  of respiratory  c/o's or need for noct saba. Also denies any obvious fluctuation of symptoms with weather or environmental changes or other aggravating or alleviating factors except as outlined above   No unusual exposure hx or h/o childhood pna/ asthma or knowledge of premature birth.  Current Allergies, Complete Past Medical History, Past Surgical History, Family History, and Social History were reviewed in [employer]  record.  ROS  The following are not active complaints unless bolded Hoarseness, sore throat, dysphagia, dental problems, itching, sneezing,  nasal congestion or discharge of excess mucus or purulent secretions, ear ache,   fever, chills, sweats, unintended wt loss or wt gain, classically pleuritic or exertional cp,  orthopnea pnd or arm/hand swelling  or leg swelling, presyncope, palpitations, abdominal pain, anorexia, nausea, vomiting, diarrhea  or change in bowel habits or change in bladder habits, change in stools or change in urine, dysuria, hematuria,  rash, arthralgias, visual complaints, headache, numbness, weakness or ataxia or problems with walking or coordination,  change in mood or  memory.        Current Meds  Medication  Sig   acetaminophen (TYLENOL) 325 MG tablet Take 650 mg by mouth every 6 (six) hours as needed for moderate pain.   albuterol (PROVENTIL) (2.5 MG/3ML) 0.083% nebulizer solution Take 2.5 mg by nebulization every 4 (four) hours as needed for wheezing or shortness of breath.   amLODipine (NORVASC) 5 MG tablet Take 5 mg by mouth daily.   aspirin 81 MG tablet Take 81 mg by mouth daily.   atorvastatin (LIPITOR) 80 MG tablet Take 80 mg by mouth daily.   Benralizumab (FASENRA) 30 MG/ML SOSY Inject 30 mg into the skin every 8 (eight) weeks.   BREZTRI AEROSPHERE 160-9-4.8 MCG/ACT AERO Inhale 2 puffs into the lungs 2 (two) times daily.   Calcium Carb-Cholecalciferol (CALCIUM 600+D3 PO) Take 1 tablet by mouth daily.   Chlorphen-Phenyleph-APAP (SINUS CONGESTION/PAIN NIGHT) 2-5-325 MG TABS Take 1-2 tablets by mouth daily as needed (congestion).   ciclopirox (LOPROX) 0.77 % cream Apply 1 Application topically 2 (two) times daily. Apply to toenails, and feet   citalopram (CELEXA) 20 MG tablet Take 20 mg by mouth daily.   dextromethorphan-guaiFENesin (MUCINEX DM) 30-600 MG 12hr tablet Take 1 tablet by mouth 2 (two) times daily as needed for cough.   dextromethorphan-guaiFENesin (TUSSIN DM) 10-100 MG/5ML liquid Take 20 mLs by mouth daily as needed for cough.   diclofenac Sodium (VOLTAREN) 1 % GEL as directed Externally   fexofenadine (ALLEGRA) 60 MG tablet Take 60 mg by mouth daily.   fluticasone (FLONASE) 50 MCG/ACT nasal spray Place 1 spray into both nostrils daily as needed for allergies.   furosemide (LASIX) 20 MG tablet Take 20 mg by mouth daily as needed for edema.   gabapentin (NEURONTIN) 400 MG capsule Take 400 mg by mouth 3 (three) times daily.   hydrochlorothiazide (HYDRODIURIL) 12.5 MG tablet Take 12.5 mg by mouth daily.   ipratropium (ATROVENT) 0.06 % nasal spray Place 1 spray into both nostrils daily as needed for rhinitis.   ipratropium-albuterol (DUONEB) 0.5-2.5 (3) MG/3ML SOLN Inhale 3 mLs into  the lungs every 4 (four) hours as needed (SOB/Wheezing).   loratadine (CLARITIN) 10 MG tablet Take 10 mg by mouth daily as needed for allergies.   metoprolol (LOPRESSOR) 100 MG tablet Take 100 mg by mouth 2 (two) times daily.   mometasone (NASONEX) 50 MCG/ACT nasal spray Place 2 sprays into the nose daily as needed (allergies).   montelukast (SINGULAIR) 10 MG tablet Take 1 tablet (10 mg total) by mouth daily.   Multiple Vitamins-Minerals (MULTIVITAMIN WITH MINERALS) tablet Take 1 tablet by mouth daily.   olmesartan (BENICAR) 20 MG tablet TAKE 1 TABLET BY MOUTH EVERY DAY   Omega-3 Fatty Acids (FISH OIL) 1000 MG CAPS Take 1,000 mg by mouth daily.   Phenyleph-Doxylamine-DM-APAP (ALKA-SELTZER PLUS DAY/NIGHT PO) Take 1 capsule by mouth 2 (  two) times daily as needed (congestion).   Phenylephrine-DM-GG (MUCINEX FAST-MAX CONGEST COUGH) 2.5-5-100 MG/5ML LIQD Take 20 mLs by mouth daily as needed (congestion).   sodium chloride (OCEAN) 0.65 % SOLN nasal spray Place 1 spray into both nostrils as needed for congestion.   VENTOLIN HFA 108 (90 Base) MCG/ACT inhaler Inhale 2 puffs into the lungs every 4 (four) hours as needed for wheezing or shortness of breath.   Vitamin D, Ergocalciferol, (DRISDOL) 1.25 MG (50000 UT) CAPS capsule Take 50,000 Units by mouth every 30 (thirty) days.   zinc gluconate 50 MG tablet Take 50 mg by mouth daily.   Current Facility-Administered Medications for the 10/15/22 encounter (Office Visit) with Tanda Rockers, MD  Medication   Benralizumab SOSY 30 mg         Past Medical History:  Diagnosis Date   Allergic rhinitis    Asthma    COPD (chronic obstructive pulmonary disease) ([place])    Diabetes mellitus without complication ([place])    Hyperlipidemia    Hypertension    Recurrent upper respiratory infection (URI)       Objective:    Wts  10/15/2022     169  06/11/2022       176   06/11/21 182 lb (82.6 kg)  03/25/21 188 lb 3.2 oz (85.4 kg)  12/03/20 182 lb 3.2 oz (82.6  kg)    Vital signs reviewed  10/15/2022  - Note at rest 02 sats  96% on RA   General appearance:    pleasant amb  mod obese bf nad   HEENT : Oropharynx  clear     Nasal turbinates nl    NECK :  without  apparent JVD/ palpable Nodes/TM    LUNGS: no acc muscle use,  Nl contour chest which is clear to A and P bilaterally without cough on insp or exp maneuvers   CV:  RRR  no s3 or murmur or increase in P2, and no edema   ABD: obese  soft and nontender    MS:  Nl gait/ ext warm without deformities Or obvious joint restrictions  calf tenderness, cyanosis or clubbing    SKIN: warm and dry without lesions    NEURO:  alert, approp, nl sensorium with  no motor or cerebellar deficits apparent.          Assessment

## 2022-10-15 NOTE — Patient Instructions (Signed)
Also use the spacer for all of your inhalers   Work on inhaler technique:  relax and gently blow all the way out then take a nice smooth full deep breath back in, triggering the inhaler right before  you start breathing in.  Hold breath in for at least  5 seconds if you can. Blow out breztri  thru nose. Rinse and gargle with water when done.  If mouth or throat bother you at all,  try brushing teeth/gums/tongue with arm and hammer toothpaste/ make a slurry and gargle and spit out.   My office will be contacting you by phone for referral to PFTs at [facility]   - if you don't hear back from my office within one week please call us back or notify us thru MyChart and we'll address it right away  Please schedule a follow up visit in12 months but call sooner if needed

## 2022-10-15 NOTE — Assessment & Plan Note (Addendum)
Quit smoking 2013 with multiple spirometries showing min obsruction/ mostly restriction  - Ursula Beath rx spring 2021 [facility] - Daily pred per arthritic doctor as of 1st pulmonary eval 06/11/2021 with [place] hump noted - 06/11/2021 rec trial off ACEi   - 06/11/2022  After extensive coaching inhaler device,  effectiveness =    25% with hfa  (late insp, very short Ti)  - 10/15/2022  After extensive coaching inhaler device,  effectiveness =    0 without spacer/ 75% with spacer (Ti still  too short)  All goals of chronic asthma control met including optimal function(though not nl)  and elimination of symptoms with minimal need for rescue therapy.  Contingencies discussed in full including contacting this office immediately if not controlling the symptoms using the rule of two's.     rec full pfts and f/u yearly, sooner prn          Each maintenance medication was reviewed in detail including emphasizing most importantly the difference between maintenance and prns and under what circumstances the prns are to be triggered using an action plan format where appropriate.  Total time for H and P, chart review, counseling, reviewing hfa  device(s) and generating customized AVS unique to this office visit / same day charting = 25 min

## 2022-10-26 ENCOUNTER — Ambulatory Visit (INDEPENDENT_AMBULATORY_CARE_PROVIDER_SITE_OTHER): Payer: Medicare PPO | Admitting: Urology

## 2022-10-26 ENCOUNTER — Encounter: Payer: Self-pay | Admitting: Urology

## 2022-10-26 VITALS — BP 135/68 | HR 54

## 2022-10-26 DIAGNOSIS — B3731 Acute candidiasis of vulva and vagina: Secondary | ICD-10-CM

## 2022-10-26 DIAGNOSIS — N281 Cyst of kidney, acquired: Secondary | ICD-10-CM

## 2022-10-26 MED ORDER — FLUCONAZOLE 150 MG PO TABS: 150.0000 mg | ORAL_TABLET | Freq: Every day | ORAL | 0 refills | Status: DC

## 2022-10-26 NOTE — Progress Notes (Signed)
10/26/2022 10:27 AM   Debra [name] 10/17/1946 [id number]  Referring provider: Alfonse Flavors, MD [address] [place],  [place] [address]  Followup renal cysts   HPI: Debra [name] is a 76yo here for followup for right renal cysts. Renal US 10/25 shows a stable 83m right renal cyst. She denies flank pain. No significant LUTS. No other complaints today   PMH: Past Medical History:  Diagnosis Date   Allergic rhinitis    Arthritis    Asthma    Chronic kidney disease (CKD)    COPD (chronic obstructive pulmonary disease) (HCC)    Diabetes mellitus without complication ([place]    Dyspnea    Hyperlipidemia    Hypertension    Hypertensive chronic kidney disease    Recurrent upper respiratory infection (URI)    Secondary hyperparathyroidism of renal origin ([facility]     Surgical History: Past Surgical History:  Procedure Laterality Date   CHOLECYSTECTOMY     CORONARY ARTERY BYPASS GRAFT     PARTIAL HYSTERECTOMY  1981   ovaries left in situ, was done for fibroids   ROBOTIC ASSISTED BILATERAL SALPINGO OOPHERECTOMY Bilateral 07/27/2022   Procedure: XI ROBOTIC ARancho Banquete;  Surgeon: TLafonda Mosses MD;  Location: [facility];  Service: Gynecology;  Laterality: Bilateral;    Home Medications:  Allergies as of 10/26/2022   No Known Allergies      Medication List        Accurate as of October 26, 2022 10:27 AM. If you have any questions, ask your nurse or doctor.          acetaminophen 325 MG tablet Commonly known as: TYLENOL Take 650 mg by mouth every 6 (six) hours as needed for moderate pain.   albuterol (2.5 MG/3ML) 0.083% nebulizer solution Commonly known as: PROVENTIL Take 2.5 mg by nebulization every 4 (four) hours as needed for wheezing or shortness of breath.   Ventolin HFA 108 (90 Base) MCG/ACT inhaler Generic drug: albuterol Inhale 2 puffs into the lungs every 4 (four) hours as needed for wheezing or shortness  of breath.   ALKA-SELTZER PLUS DAY/NIGHT PO Take 1 capsule by mouth 2 (two) times daily as needed (congestion).   amLODipine 5 MG tablet Commonly known as: NORVASC Take 5 mg by mouth daily.   aspirin 81 MG tablet Take 81 mg by mouth daily.   atorvastatin 80 MG tablet Commonly known as: LIPITOR Take 80 mg by mouth daily.   Breztri Aerosphere 160-9-4.8 MCG/ACT Aero Generic drug: Budeson-Glycopyrrol-Formoterol Inhale 2 puffs into the lungs 2 (two) times daily.   CALCIUM 600+D3 PO Take 1 tablet by mouth daily.   ciclopirox 0.77 % cream Commonly known as: LOPROX Apply 1 Application topically 2 (two) times daily. Apply to toenails, and feet   citalopram 20 MG tablet Commonly known as: CELEXA Take 20 mg by mouth daily.   dextromethorphan-guaiFENesin 30-600 MG 12hr tablet Commonly known as: MUCINEX DM Take 1 tablet by mouth 2 (two) times daily as needed for cough.   Tussin DM 10-100 MG/5ML liquid Generic drug: dextromethorphan-guaiFENesin Take 20 mLs by mouth daily as needed for cough.   Fasenra 30 MG/ML Sosy Generic drug: Benralizumab Inject 30 mg into the skin every 8 (eight) weeks.   fexofenadine 60 MG tablet Commonly known as: ALLEGRA Take 60 mg by mouth daily.   Fish Oil 1000 MG Caps Take 1,000 mg by mouth daily.   fluticasone 50 MCG/ACT nasal spray Commonly known as: FLONASE Place 1  spray into both nostrils daily as needed for allergies.   furosemide 20 MG tablet Commonly known as: LASIX Take 20 mg by mouth daily as needed for edema.   gabapentin 400 MG capsule Commonly known as: NEURONTIN Take 400 mg by mouth 3 (three) times daily.   hydrochlorothiazide 12.5 MG tablet Commonly known as: HYDRODIURIL Take 12.5 mg by mouth daily.   ipratropium 0.06 % nasal spray Commonly known as: ATROVENT Place 1 spray into both nostrils daily as needed for rhinitis.   ipratropium-albuterol 0.5-2.5 (3) MG/3ML Soln Commonly known as: DUONEB Inhale 3 mLs into the  lungs every 4 (four) hours as needed (SOB/Wheezing).   loratadine 10 MG tablet Commonly known as: CLARITIN Take 10 mg by mouth daily as needed for allergies.   metoprolol tartrate 100 MG tablet Commonly known as: LOPRESSOR Take 100 mg by mouth 2 (two) times daily.   mometasone 50 MCG/ACT nasal spray Commonly known as: NASONEX Place 2 sprays into the nose daily as needed (allergies).   montelukast 10 MG tablet Commonly known as: SINGULAIR Take 1 tablet (10 mg total) by mouth daily.   Mucinex Fast-Max Congest Cough 2.5-5-100 MG/5ML Liqd Generic drug: Phenylephrine-DM-GG Take 20 mLs by mouth daily as needed (congestion).   multivitamin with minerals tablet Take 1 tablet by mouth daily.   olmesartan 20 MG tablet Commonly known as: BENICAR TAKE 1 TABLET BY MOUTH EVERY DAY   Sinus Congestion/Pain Night 2-5-325 MG Tabs Generic drug: Chlorphen-Phenyleph-APAP Take 1-2 tablets by mouth daily as needed (congestion).   sodium chloride 0.65 % Soln nasal spray Commonly known as: OCEAN Place 1 spray into both nostrils as needed for congestion.   Vitamin D (Ergocalciferol) 1.25 MG (50000 UNIT) Caps capsule Commonly known as: DRISDOL Take 50,000 Units by mouth every 30 (thirty) days.   Voltaren 1 % Gel Generic drug: diclofenac Sodium as directed Externally   zinc gluconate 50 MG tablet Take 50 mg by mouth daily.        Allergies: No Known Allergies  Family History: Family History  Problem Relation Age of Onset   Stroke Mother    Cancer Father    Prostate cancer Father    Alcohol abuse Sister    Heart attack Brother    Alcohol abuse Brother    Other Brother        MVA   Asthma Daughter    Miscarriages / [place] Daughter    Diabetes Son    Allergic rhinitis Neg Hx    Eczema Neg Hx    Immunodeficiency Neg Hx    Urticaria Neg Hx    Angioedema Neg Hx    Atopy Neg Hx     Social History:  reports that she quit smoking about 10 years ago. Her smoking use  included cigarettes. She has never used smokeless tobacco. She reports that she does not drink alcohol and does not use drugs.  ROS: All other review of systems were reviewed and are negative except what is noted above in HPI  Physical Exam: BP 135/68   Pulse (!) 54   Constitutional:  Alert and oriented, No acute distress. HEENT: [place] AT, moist mucus membranes.  Trachea midline, no masses. Cardiovascular: No clubbing, cyanosis, or edema. Respiratory: Normal respiratory effort, no increased work of breathing. GI: Abdomen is soft, nontender, nondistended, no abdominal masses GU: No CVA tenderness.  Lymph: No cervical or inguinal lymphadenopathy. Skin: No rashes, bruises or suspicious lesions. Neurologic: Grossly intact, no focal deficits, moving all 4 extremities. Psychiatric: Normal  mood and affect.  Laboratory Data: Lab Results  Component Value Date   WBC 16.4 (H) 07/28/2022   HGB 11.4 (L) 07/28/2022   HCT 35.2 (L) 07/28/2022   MCV 92.6 07/28/2022   PLT 201 07/28/2022    Lab Results  Component Value Date   CREATININE 1.32 (H) 08/05/2022    No results found for: "PSA"  No results found for: "TESTOSTERONE"  Lab Results  Component Value Date   HGBA1C 6.3 (H) 07/14/2022    Urinalysis    Component Value Date/Time   APPEARANCEUR Clear 10/21/2021 1005   GLUCOSEU Negative 10/21/2021 1005   BILIRUBINUR Negative 10/21/2021 1005   PROTEINUR Negative 10/21/2021 1005   NITRITE Negative 10/21/2021 1005   LEUKOCYTESUR 1+ (A) 10/21/2021 1005    Lab Results  Component Value Date   LABMICR See below: 10/21/2021   WBCUA 11-30 (A) 10/21/2021   LABEPIT 0-10 10/21/2021   BACTERIA Few (A) 10/21/2021    Pertinent Imaging: Renal US 10/13/2022: Images reviewed and discussed with the patient  No results found for this or any previous visit.  No results found for this or any previous visit.  Results for orders placed during the hospital encounter of 07/30/21  US ABDOMINAL  PELVIC ART/VENT FLOW DOPPLER  Narrative CLINICAL DATA:  RIGHT ovarian cyst seen on renal ultrasound, postmenopausal  EXAM: TRANSABDOMINAL ULTRASOUND OF PELVIS  DOPPLER ULTRASOUND OF OVARIES  TECHNIQUE: Transabdominal ultrasound examination of the pelvis was performed including evaluation of the uterus, ovaries, adnexal regions, and pelvic cul-de-sac.  Color and duplex Doppler ultrasound was utilized to evaluate blood flow to the ovaries.  COMPARISON:  None  FINDINGS: Uterus  Surgically absent  Endometrium  Surgically absent  Right ovary  Measurements: 6.1 x 4.4 x 5.0 cm = volume: 69 mL. Cyst identified within RIGHT ovary 4.7 x 3.2 x 3.2 cm containing a few scattered internal echoes. No definite mural nodularity or septations.  Left ovary  Not visualized, question surgically absent versus obscured by bowel  Pulsed Doppler evaluation demonstrates normal low-resistance arterial and venous waveforms in the RIGHT ovary. LEFT ovary is not visualized for Doppler assessment.  Other: No free pelvic fluid or other pelvic masses.  IMPRESSION: Post hysterectomy with nonvisualization of LEFT ovary.  Cyst within RIGHT ovary containing a few scattered internal echoes, 4.7 cm greatest size; Recommend followup US in 3-6 months. Note: This recommendation does not apply to premenarchal patients or to those with increased risk (genetic, family history, elevated tumor markers or other high-risk factors) of ovarian cancer. Reference: Radiology 2019 Nov; 293(2):359-371.   Electronically Signed By: Lavonia Dana M.D. On: 07/30/2021 17:03  No results found for this or any previous visit.  Results for orders placed during the hospital encounter of 10/13/22  Ultrasound renal complete  Narrative CLINICAL DATA:  renal cysts; CKD  EXAM: RENAL / URINARY TRACT ULTRASOUND COMPLETE  COMPARISON:  CT dated July 21, 2022; ultrasound dated July 09, 2021  FINDINGS: Right  Kidney:  Renal measurements: 9.5 x 4.9 x 6.3 cm = volume: 152 mL. Echogenicity within normal limits. No suspicious mass or hydronephrosis visualized. Mild renal cortical thinning. There is a benign 22 mm cyst of the inferior pole (for which no dedicated imaging follow-up is recommended).  Left Kidney:  Renal measurements: 10.9 x 5.1 x 6.1 cm = volume: 178 mL. Echogenicity within normal limits. No mass or hydronephrosis visualized. Mild renal cortical thinning.  Bladder:  Appears normal for degree of bladder distention.  Other:  None.  IMPRESSION: No  hydronephrosis.   Electronically Signed By: Valentino Saxon M.D. On: 10/14/2022 17:00  No valid procedures specified. No results found for this or any previous visit.  No results found for this or any previous visit.   Assessment & Plan:    1. Kidney cysts -We discussed the benign nature of these cysts. The patient can followup PRN - Urinalysis, Routine w reflex microscopic  2. Yeast infection involving the vagina and surrounding area Diflucan '150mg'$  daily x 2 days   No follow-ups on file.  Nicolette Bang, MD  [facility] [place]

## 2022-10-27 LAB — URINALYSIS, ROUTINE W REFLEX MICROSCOPIC
Bilirubin, UA: NEGATIVE
Glucose, UA: NEGATIVE
Ketones, UA: NEGATIVE
Nitrite, UA: NEGATIVE
Protein,UA: NEGATIVE
RBC, UA: NEGATIVE
Specific Gravity, UA: 1.015 (ref 1.005–1.030)
Urobilinogen, Ur: 0.2 mg/dL (ref 0.2–1.0)
pH, UA: 6 (ref 5.0–7.5)

## 2022-10-27 LAB — MICROSCOPIC EXAMINATION

## 2022-11-02 ENCOUNTER — Telehealth: Payer: Medicare Other | Admitting: Urology

## 2022-11-24 ENCOUNTER — Telehealth: Payer: Self-pay

## 2022-11-24 NOTE — Telephone Encounter (Signed)
Debra [name] called to say that she is doing very well and she wanted to thank Dr. Berline Lopes for the care she received. She wished everyone a Merry Christmas.

## 2022-11-25 ENCOUNTER — Telehealth: Payer: Self-pay | Admitting: *Deleted

## 2022-11-25 NOTE — Telephone Encounter (Signed)
Per Dr Berline Lopes patient scheduled for an appt on 3/22

## 2022-11-25 NOTE — Telephone Encounter (Signed)
Thanks [employer] Sharyn Lull - could you call her to schedule follow-up in February or March? Thank you!

## 2022-11-29 ENCOUNTER — Ambulatory Visit: Payer: Medicare PPO

## 2022-12-10 ENCOUNTER — Encounter (HOSPITAL_COMMUNITY): Payer: Self-pay

## 2022-12-10 ENCOUNTER — Ambulatory Visit (INDEPENDENT_AMBULATORY_CARE_PROVIDER_SITE_OTHER): Payer: Medicare PPO

## 2022-12-10 ENCOUNTER — Emergency Department (HOSPITAL_COMMUNITY): Payer: Medicare PPO

## 2022-12-10 ENCOUNTER — Other Ambulatory Visit: Payer: Self-pay

## 2022-12-10 ENCOUNTER — Inpatient Hospital Stay (HOSPITAL_COMMUNITY)
Admission: EM | Admit: 2022-12-10 | Discharge: 2022-12-12 | DRG: 871 | Disposition: A | Discharge status: Home / Self Care | Payer: Medicare PPO | Attending: Internal Medicine | Admitting: Internal Medicine

## 2022-12-10 DIAGNOSIS — Z87891 Personal history of nicotine dependence: Secondary | ICD-10-CM

## 2022-12-10 DIAGNOSIS — Z823 Family history of stroke: Secondary | ICD-10-CM

## 2022-12-10 DIAGNOSIS — N2581 Secondary hyperparathyroidism of renal origin: Secondary | ICD-10-CM | POA: Diagnosis present

## 2022-12-10 DIAGNOSIS — Z79899 Other long term (current) drug therapy: Secondary | ICD-10-CM

## 2022-12-10 DIAGNOSIS — Z833 Family history of diabetes mellitus: Secondary | ICD-10-CM

## 2022-12-10 DIAGNOSIS — J4550 Severe persistent asthma, uncomplicated: Secondary | ICD-10-CM

## 2022-12-10 DIAGNOSIS — R6510 Systemic inflammatory response syndrome (SIRS) of non-infectious origin without acute organ dysfunction: Secondary | ICD-10-CM | POA: Diagnosis present

## 2022-12-10 DIAGNOSIS — I129 Hypertensive chronic kidney disease with stage 1 through stage 4 chronic kidney disease, or unspecified chronic kidney disease: Secondary | ICD-10-CM | POA: Diagnosis present

## 2022-12-10 DIAGNOSIS — E1122 Type 2 diabetes mellitus with diabetic chronic kidney disease: Secondary | ICD-10-CM | POA: Diagnosis present

## 2022-12-10 DIAGNOSIS — Z8249 Family history of ischemic heart disease and other diseases of the circulatory system: Secondary | ICD-10-CM

## 2022-12-10 DIAGNOSIS — Z8042 Family history of malignant neoplasm of prostate: Secondary | ICD-10-CM

## 2022-12-10 DIAGNOSIS — Z951 Presence of aortocoronary bypass graft: Secondary | ICD-10-CM

## 2022-12-10 DIAGNOSIS — Z7982 Long term (current) use of aspirin: Secondary | ICD-10-CM

## 2022-12-10 DIAGNOSIS — Z90711 Acquired absence of uterus with remaining cervical stump: Secondary | ICD-10-CM

## 2022-12-10 DIAGNOSIS — Z825 Family history of asthma and other chronic lower respiratory diseases: Secondary | ICD-10-CM

## 2022-12-10 DIAGNOSIS — A419 Sepsis, unspecified organism: Secondary | ICD-10-CM | POA: Diagnosis not present

## 2022-12-10 DIAGNOSIS — J189 Pneumonia, unspecified organism: Secondary | ICD-10-CM | POA: Diagnosis present

## 2022-12-10 DIAGNOSIS — Z811 Family history of alcohol abuse and dependence: Secondary | ICD-10-CM

## 2022-12-10 DIAGNOSIS — Z9049 Acquired absence of other specified parts of digestive tract: Secondary | ICD-10-CM

## 2022-12-10 DIAGNOSIS — J441 Chronic obstructive pulmonary disease with (acute) exacerbation: Secondary | ICD-10-CM | POA: Diagnosis not present

## 2022-12-10 DIAGNOSIS — Z1152 Encounter for screening for COVID-19: Secondary | ICD-10-CM

## 2022-12-10 DIAGNOSIS — J440 Chronic obstructive pulmonary disease with acute lower respiratory infection: Secondary | ICD-10-CM | POA: Diagnosis present

## 2022-12-10 DIAGNOSIS — E785 Hyperlipidemia, unspecified: Secondary | ICD-10-CM | POA: Diagnosis present

## 2022-12-10 DIAGNOSIS — N1832 Chronic kidney disease, stage 3b: Secondary | ICD-10-CM | POA: Diagnosis present

## 2022-12-10 DIAGNOSIS — I10 Essential (primary) hypertension: Secondary | ICD-10-CM | POA: Diagnosis present

## 2022-12-10 DIAGNOSIS — J209 Acute bronchitis, unspecified: Secondary | ICD-10-CM | POA: Diagnosis present

## 2022-12-10 DIAGNOSIS — F32A Depression, unspecified: Secondary | ICD-10-CM | POA: Diagnosis present

## 2022-12-10 DIAGNOSIS — B9729 Other coronavirus as the cause of diseases classified elsewhere: Secondary | ICD-10-CM | POA: Diagnosis present

## 2022-12-10 DIAGNOSIS — J9601 Acute respiratory failure with hypoxia: Secondary | ICD-10-CM | POA: Diagnosis present

## 2022-12-10 LAB — COMPREHENSIVE METABOLIC PANEL
ALT: 17 U/L (ref 0–44)
AST: 15 U/L (ref 15–41)
Albumin: 3.5 g/dL (ref 3.5–5.0)
Alkaline Phosphatase: 54 U/L (ref 38–126)
Anion gap: 9 (ref 5–15)
BUN: 14 mg/dL (ref 8–23)
CO2: 27 mmol/L (ref 22–32)
Calcium: 8.8 mg/dL — ABNORMAL LOW (ref 8.9–10.3)
Chloride: 105 mmol/L (ref 98–111)
Creatinine, Ser: 1.25 mg/dL — ABNORMAL HIGH (ref 0.44–1.00)
GFR, Estimated: 45 mL/min — ABNORMAL LOW (ref >60)
Glucose, Bld: 106 mg/dL — ABNORMAL HIGH (ref 70–99)
Potassium: 3.8 mmol/L (ref 3.5–5.1)
Sodium: 141 mmol/L (ref 135–145)
Total Bilirubin: 0.9 mg/dL (ref 0.3–1.2)
Total Protein: 6.9 g/dL (ref 6.5–8.1)

## 2022-12-10 LAB — LACTIC ACID, PLASMA: Lactic Acid, Venous: 1.2 mmol/L (ref 0.5–1.9)

## 2022-12-10 LAB — CBC WITH DIFFERENTIAL/PLATELET
Abs Immature Granulocytes: 0.16 10*3/uL — ABNORMAL HIGH (ref 0.00–0.07)
Basophils Absolute: 0 10*3/uL (ref 0.0–0.1)
Basophils Relative: 0 %
Eosinophils Absolute: 0.1 10*3/uL (ref 0.0–0.5)
Eosinophils Relative: 0 %
HCT: 35.1 % — ABNORMAL LOW (ref 36.0–46.0)
Hemoglobin: 11.5 g/dL — ABNORMAL LOW (ref 12.0–15.0)
Immature Granulocytes: 1 %
Lymphocytes Relative: 14 %
Lymphs Abs: 2.9 10*3/uL (ref 0.7–4.0)
MCH: 29.6 pg (ref 26.0–34.0)
MCHC: 32.8 g/dL (ref 30.0–36.0)
MCV: 90.2 fL (ref 80.0–100.0)
Monocytes Absolute: 1.6 10*3/uL — ABNORMAL HIGH (ref 0.1–1.0)
Monocytes Relative: 7 %
Neutro Abs: 16.3 10*3/uL — ABNORMAL HIGH (ref 1.7–7.7)
Neutrophils Relative %: 78 %
Platelets: 165 10*3/uL (ref 150–400)
RBC: 3.89 MIL/uL (ref 3.87–5.11)
RDW: 14.8 % (ref 11.5–15.5)
WBC: 23.3 10*3/uL — ABNORMAL HIGH (ref 4.0–10.5)
nRBC: 0.1 % (ref 0.0–0.2)

## 2022-12-10 LAB — PROTIME-INR
INR: 1 (ref 0.8–1.2)
Prothrombin Time: 13 seconds (ref 11.4–15.2)

## 2022-12-10 LAB — RESP PANEL BY RT-PCR (RSV, FLU A&B, COVID)  RVPGX2
Influenza A by PCR: NEGATIVE
Influenza B by PCR: NEGATIVE
Resp Syncytial Virus by PCR: NEGATIVE
SARS Coronavirus 2 by RT PCR: NEGATIVE

## 2022-12-10 MED ORDER — MONTELUKAST SODIUM 10 MG PO TABS
10.0000 mg | ORAL_TABLET | Freq: Every day | ORAL | Status: DC
  Administered 2022-12-11 – 2022-12-12 (×2): 10 mg via ORAL
  Filled 2022-12-10 (×2): qty 1

## 2022-12-10 MED ORDER — ACETAMINOPHEN 325 MG PO TABS
650.0000 mg | ORAL_TABLET | Freq: Once | ORAL | Status: AC
  Administered 2022-12-10: 650 mg via ORAL
  Filled 2022-12-10: qty 2

## 2022-12-10 MED ORDER — AMLODIPINE BESYLATE 5 MG PO TABS
5.0000 mg | ORAL_TABLET | Freq: Every day | ORAL | Status: DC
  Administered 2022-12-11 – 2022-12-12 (×2): 5 mg via ORAL
  Filled 2022-12-10 (×2): qty 1

## 2022-12-10 MED ORDER — IPRATROPIUM-ALBUTEROL 0.5-2.5 (3) MG/3ML IN SOLN
3.0000 mL | Freq: Four times a day (QID) | RESPIRATORY_TRACT | Status: DC
  Administered 2022-12-11 (×4): 3 mL via RESPIRATORY_TRACT
  Filled 2022-12-10 (×4): qty 3

## 2022-12-10 MED ORDER — IPRATROPIUM-ALBUTEROL 0.5-2.5 (3) MG/3ML IN SOLN
3.0000 mL | Freq: Once | RESPIRATORY_TRACT | Status: AC
  Administered 2022-12-10: 3 mL via RESPIRATORY_TRACT
  Filled 2022-12-10: qty 3

## 2022-12-10 MED ORDER — ACETAMINOPHEN 650 MG RE SUPP: 650.0000 mg | Freq: Four times a day (QID) | RECTAL | Status: DC | PRN

## 2022-12-10 MED ORDER — CITALOPRAM HYDROBROMIDE 20 MG PO TABS
20.0000 mg | ORAL_TABLET | Freq: Every day | ORAL | Status: DC
  Administered 2022-12-11 – 2022-12-12 (×2): 20 mg via ORAL
  Filled 2022-12-10 (×2): qty 1

## 2022-12-10 MED ORDER — ONDANSETRON HCL 4 MG PO TABS: 4.0000 mg | ORAL_TABLET | Freq: Four times a day (QID) | ORAL | Status: DC | PRN

## 2022-12-10 MED ORDER — PREDNISONE 20 MG PO TABS
40.0000 mg | ORAL_TABLET | Freq: Every day | ORAL | Status: DC
  Administered 2022-12-12: 40 mg via ORAL
  Filled 2022-12-10: qty 2

## 2022-12-10 MED ORDER — ASPIRIN 81 MG PO CHEW
81.0000 mg | CHEWABLE_TABLET | Freq: Every day | ORAL | Status: DC
  Administered 2022-12-11 – 2022-12-12 (×2): 81 mg via ORAL
  Filled 2022-12-10 (×2): qty 1

## 2022-12-10 MED ORDER — MORPHINE SULFATE (PF) 2 MG/ML IV SOLN: 2.0000 mg | INTRAVENOUS | Status: DC | PRN

## 2022-12-10 MED ORDER — METOPROLOL TARTRATE 50 MG PO TABS
100.0000 mg | ORAL_TABLET | Freq: Two times a day (BID) | ORAL | Status: DC
  Administered 2022-12-11 – 2022-12-12 (×4): 100 mg via ORAL
  Filled 2022-12-10 (×4): qty 2

## 2022-12-10 MED ORDER — OXYCODONE HCL 5 MG PO TABS: 5.0000 mg | ORAL_TABLET | ORAL | Status: DC | PRN

## 2022-12-10 MED ORDER — ACETAMINOPHEN 325 MG PO TABS
650.0000 mg | ORAL_TABLET | Freq: Four times a day (QID) | ORAL | Status: DC | PRN
  Administered 2022-12-11: 650 mg via ORAL
  Filled 2022-12-10: qty 2

## 2022-12-10 MED ORDER — GABAPENTIN 400 MG PO CAPS
400.0000 mg | ORAL_CAPSULE | Freq: Three times a day (TID) | ORAL | Status: DC
  Administered 2022-12-11 – 2022-12-12 (×5): 400 mg via ORAL
  Filled 2022-12-10 (×5): qty 1

## 2022-12-10 MED ORDER — LACTATED RINGERS IV BOLUS (SEPSIS)
500.0000 mL | Freq: Once | INTRAVENOUS | Status: AC
  Administered 2022-12-10: 500 mL via INTRAVENOUS

## 2022-12-10 MED ORDER — HEPARIN SODIUM (PORCINE) 5000 UNIT/ML IJ SOLN
5000.0000 [IU] | Freq: Three times a day (TID) | INTRAMUSCULAR | Status: DC
  Filled 2022-12-10 (×2): qty 1

## 2022-12-10 MED ORDER — METHYLPREDNISOLONE SODIUM SUCC 125 MG IJ SOLR
125.0000 mg | Freq: Once | INTRAMUSCULAR | Status: AC
  Administered 2022-12-11: 125 mg via INTRAVENOUS
  Filled 2022-12-10: qty 2

## 2022-12-10 MED ORDER — HYDROCHLOROTHIAZIDE 12.5 MG PO TABS
12.5000 mg | ORAL_TABLET | Freq: Every day | ORAL | Status: DC
  Administered 2022-12-11 – 2022-12-12 (×2): 12.5 mg via ORAL
  Filled 2022-12-10 (×2): qty 1

## 2022-12-10 MED ORDER — ONDANSETRON HCL 4 MG/2ML IJ SOLN: 4.0000 mg | Freq: Four times a day (QID) | INTRAMUSCULAR | Status: DC | PRN

## 2022-12-10 MED ORDER — BUDESON-GLYCOPYRROL-FORMOTEROL 160-9-4.8 MCG/ACT IN AERO: 2.0000 | INHALATION_SPRAY | Freq: Two times a day (BID) | RESPIRATORY_TRACT | Status: DC

## 2022-12-10 MED ORDER — IRBESARTAN 150 MG PO TABS
150.0000 mg | ORAL_TABLET | Freq: Every day | ORAL | Status: DC
  Administered 2022-12-11 – 2022-12-12 (×2): 150 mg via ORAL
  Filled 2022-12-10 (×2): qty 1

## 2022-12-10 MED ORDER — METHYLPREDNISOLONE SODIUM SUCC 125 MG IJ SOLR
125.0000 mg | Freq: Two times a day (BID) | INTRAMUSCULAR | Status: AC
  Administered 2022-12-11 (×2): 125 mg via INTRAVENOUS
  Filled 2022-12-10 (×3): qty 2

## 2022-12-10 MED ORDER — ALBUTEROL SULFATE (2.5 MG/3ML) 0.083% IN NEBU
2.5000 mg | INHALATION_SOLUTION | RESPIRATORY_TRACT | Status: DC | PRN
  Administered 2022-12-11: 2.5 mg via RESPIRATORY_TRACT
  Filled 2022-12-10: qty 3

## 2022-12-10 MED ORDER — FUROSEMIDE 40 MG PO TABS: 20.0000 mg | ORAL_TABLET | Freq: Every day | ORAL | Status: DC | PRN

## 2022-12-10 MED ORDER — ATORVASTATIN CALCIUM 40 MG PO TABS
80.0000 mg | ORAL_TABLET | Freq: Every day | ORAL | Status: DC
  Administered 2022-12-11 – 2022-12-12 (×2): 80 mg via ORAL
  Filled 2022-12-10 (×2): qty 2

## 2022-12-10 NOTE — ED Notes (Signed)
Pt taken to room 11 to finish triage

## 2022-12-10 NOTE — Progress Notes (Signed)
Nebulizer treatment completed.

## 2022-12-10 NOTE — ED Provider Notes (Signed)
[facility] EMERGENCY DEPARTMENT Provider Note   CSN: [id number] Arrival date & time: 12/10/22  1924     History {Add pertinent medical, surgical, social history, OB history to HPI:1} Chief Complaint  Patient presents with   Shortness of Breath    Debra [name] is a 76 y.o. female.   Shortness of Breath    Has a history of diabetes, hyperlipidemia, hypertension, asthma, COPD, chronic kidney disease, hyperparathyroidism, arthritis.  Patient states she was diagnosed with pneumonia few weeks ago and was treated with a course of antibiotics.  Patient started having trouble with increasing shortness of breath today.  She went to an urgent care and received treatment.  Patient started having worsening symptoms and she came to the ED.  Patient has been feeling short of breath.  She was not aware that she was having any fevers.  She has been coughing.  She denies any chest pain.  No vomiting or diarrhea.  No leg swelling.  She does not smoke or is on oxygen chronically  Home Medications Prior to Admission medications   Medication Sig Start Date End Date Taking? Authorizing Provider  acetaminophen (TYLENOL) 325 MG tablet Take 650 mg by mouth every 6 (six) hours as needed for moderate pain.    [provider]  albuterol (PROVENTIL) (2.5 MG/3ML) 0.083% nebulizer solution Take 2.5 mg by nebulization every 4 (four) hours as needed for wheezing or shortness of breath.    [provider]  amLODipine (NORVASC) 5 MG tablet Take 5 mg by mouth daily.    [provider]  aspirin 81 MG tablet Take 81 mg by mouth daily.    [provider]  atorvastatin (LIPITOR) 80 MG tablet Take 80 mg by mouth daily.    [provider]  Benralizumab (FASENRA) 30 MG/ML SOSY Inject 30 mg into the skin every 8 (eight) weeks.    [provider]  BREZTRI AEROSPHERE 160-9-4.8 MCG/ACT AERO Inhale 2 puffs into the lungs 2 (two) times daily. 09/17/22   Valentina Shaggy, MD   Calcium Carb-Cholecalciferol (CALCIUM 600+D3 PO) Take 1 tablet by mouth daily.    [provider]  Chlorphen-Phenyleph-APAP (SINUS CONGESTION/PAIN NIGHT) 2-5-325 MG TABS Take 1-2 tablets by mouth daily as needed (congestion).    [provider]  ciclopirox (LOPROX) 0.77 % cream Apply 1 Application topically 2 (two) times daily. Apply to toenails, and feet 07/07/22   [provider]  citalopram (CELEXA) 20 MG tablet Take 20 mg by mouth daily. 10/06/21   [provider]  dextromethorphan-guaiFENesin (MUCINEX DM) 30-600 MG 12hr tablet Take 1 tablet by mouth 2 (two) times daily as needed for cough.    [provider]  dextromethorphan-guaiFENesin (TUSSIN DM) 10-100 MG/5ML liquid Take 20 mLs by mouth daily as needed for cough.    [provider]  diclofenac Sodium (VOLTAREN) 1 % GEL as directed Externally    [provider]  fexofenadine (ALLEGRA) 60 MG tablet Take 60 mg by mouth daily.    [provider]  fluconazole (DIFLUCAN) 150 MG tablet Take 1 tablet (150 mg total) by mouth daily. 10/26/22   McKenzie, Candee Furbish, MD  fluticasone (FLONASE) 50 MCG/ACT nasal spray Place 1 spray into both nostrils daily as needed for allergies.    [provider]  furosemide (LASIX) 20 MG tablet Take 20 mg by mouth daily as needed for edema. 07/12/21   [provider]  gabapentin (NEURONTIN) 400 MG capsule Take 400 mg by mouth 3 (three) times  daily. 07/22/21   [provider]  hydrochlorothiazide (HYDRODIURIL) 12.5 MG tablet Take 12.5 mg by mouth daily. 07/24/19   [provider]  ipratropium (ATROVENT) 0.06 % nasal spray Place 1 spray into both nostrils daily as needed for rhinitis.    [provider]  ipratropium-albuterol (DUONEB) 0.5-2.5 (3) MG/3ML SOLN Inhale 3 mLs into the lungs every 4 (four) hours as needed (SOB/Wheezing). 06/20/18   [provider]  loratadine (CLARITIN) 10 MG tablet Take 10 mg  by mouth daily as needed for allergies.    [provider]  metoprolol (LOPRESSOR) 100 MG tablet Take 100 mg by mouth 2 (two) times daily. 03/13/17   [provider]  mometasone (NASONEX) 50 MCG/ACT nasal spray Place 2 sprays into the nose daily as needed (allergies).    [provider]  montelukast (SINGULAIR) 10 MG tablet Take 1 tablet (10 mg total) by mouth daily. 09/17/22   Valentina Shaggy, MD  Multiple Vitamins-Minerals (MULTIVITAMIN WITH MINERALS) tablet Take 1 tablet by mouth daily.    [provider]  olmesartan (BENICAR) 20 MG tablet TAKE 1 TABLET BY MOUTH EVERY DAY 05/06/22   Tanda Rockers, MD  Omega-3 Fatty Acids (FISH OIL) 1000 MG CAPS Take 1,000 mg by mouth daily.    [provider]  Phenyleph-Doxylamine-DM-APAP (ALKA-SELTZER PLUS DAY/NIGHT PO) Take 1 capsule by mouth 2 (two) times daily as needed (congestion).    [provider]  Phenylephrine-DM-GG (MUCINEX FAST-MAX CONGEST COUGH) 2.5-5-100 MG/5ML LIQD Take 20 mLs by mouth daily as needed (congestion).    [provider]  sodium chloride (OCEAN) 0.65 % SOLN nasal spray Place 1 spray into both nostrils as needed for congestion.    [provider]  VENTOLIN HFA 108 (90 Base) MCG/ACT inhaler Inhale 2 puffs into the lungs every 4 (four) hours as needed for wheezing or shortness of breath. 09/17/22   Valentina Shaggy, MD  Vitamin D, Ergocalciferol, (DRISDOL) 1.25 MG (50000 UT) CAPS capsule Take 50,000 Units by mouth every 30 (thirty) days. 05/03/19   [provider]  zinc gluconate 50 MG tablet Take 50 mg by mouth daily.    [provider]      Allergies    Patient has no known allergies.    Review of Systems   Review of Systems  Respiratory:  Positive for shortness of breath.     Physical Exam Updated Vital Signs BP 120/68   Pulse 76   Temp (!) 101.2 F (38.4 C) (Oral)   Resp (!) 27   Ht 1.575 m ('5\' 2"'$ )   Wt 81.6 kg   SpO2 100%    BMI 32.92 kg/m  Physical Exam Vitals and nursing note reviewed.  Constitutional:      Appearance: She is well-developed. She is not diaphoretic.  HENT:     Head: Normocephalic and atraumatic.     Right Ear: External ear normal.     Left Ear: External ear normal.  Eyes:     General: No scleral icterus.       Right eye: No discharge.        Left eye: No discharge.     Conjunctiva/sclera: Conjunctivae normal.  Neck:     Trachea: No tracheal deviation.  Cardiovascular:     Rate and Rhythm: Normal rate and regular rhythm.  Pulmonary:     Effort: Pulmonary effort is normal. No respiratory distress.     Breath sounds: No stridor. Rales present. No wheezing.  Abdominal:  General: Bowel sounds are normal. There is no distension.     Palpations: Abdomen is soft.     Tenderness: There is no abdominal tenderness. There is no guarding or rebound.  Musculoskeletal:        General: No tenderness or deformity.     Cervical back: Neck supple.  Skin:    General: Skin is warm and dry.     Findings: No rash.  Neurological:     General: No focal deficit present.     Mental Status: She is alert.     Cranial Nerves: No cranial nerve deficit, dysarthria or facial asymmetry.     Sensory: No sensory deficit.     Motor: No abnormal muscle tone or seizure activity.     Coordination: Coordination normal.  Psychiatric:        Mood and Affect: Mood normal.     ED Results / Procedures / Treatments   Labs (all labs ordered are listed, but only abnormal results are displayed) Labs Reviewed  COMPREHENSIVE METABOLIC PANEL - Abnormal; Notable for the following components:      Result Value   Glucose, Bld 106 (*)    Creatinine, Ser 1.25 (*)    Calcium 8.8 (*)    GFR, Estimated 45 (*)    All other components within normal limits  CBC WITH DIFFERENTIAL/PLATELET - Abnormal; Notable for the following components:   WBC 23.3 (*)    Hemoglobin 11.5 (*)    HCT 35.1 (*)    Neutro Abs 16.3 (*)     Monocytes Absolute 1.6 (*)    Abs Immature Granulocytes 0.16 (*)    All other components within normal limits  CULTURE, BLOOD (ROUTINE X 2)  RESP PANEL BY RT-PCR (RSV, FLU A&B, COVID)  RVPGX2  CULTURE, BLOOD (ROUTINE X 2)  LACTIC ACID, PLASMA  PROTIME-INR    EKG EKG Interpretation  Date/Time:  Friday December 10 2022 20:40:19 EST Ventricular Rate:  78 PR Interval:  158 QRS Duration: 136 QT Interval:  417 QTC Calculation: 475 R Axis:   -52 Text Interpretation: Sinus rhythm RBBB and LAFB Nonspecific T abnormalities, lateral leads No significant change since last tracing Confirmed by Dorie Rank [phone]) on 12/10/2022 8:56:37 PM  Radiology DG Chest Port 1 View  Result Date: 12/10/2022 CLINICAL DATA:  Sepsis EXAM: PORTABLE CHEST 1 VIEW COMPARISON:  10/30/2021 FINDINGS: Cardiac shadow is mildly prominent but stable. Lungs are well aerated bilaterally. No focal infiltrate or effusion is seen. Mild vascular congestion is again seen. No bony abnormality is noted. IMPRESSION: Mild vascular congestion without edema or effusion. Electronically Signed   By: Inez Catalina M.D.   On: 12/10/2022 21:19    Procedures Procedures  {Document cardiac monitor, telemetry assessment procedure when appropriate:1}  Medications Ordered in ED Medications  methylPREDNISolone sodium succinate (SOLU-MEDROL) 125 mg/2 mL injection 125 mg (has no administration in time range)  ipratropium-albuterol (DUONEB) 0.5-2.5 (3) MG/3ML nebulizer solution 3 mL (has no administration in time range)  lactated ringers bolus 500 mL (0 mLs Intravenous Stopped 12/10/22 2150)  acetaminophen (TYLENOL) tablet 650 mg (650 mg Oral Given 12/10/22 2134)    ED Course/ Medical Decision Making/ A&P Clinical Course as of 12/10/22 2255  Fri Dec 10, 2022  2158 Cxr shows vasc congestion, no edema or effusion [JK]  2158 CBC with Differential(!) Elevated  [JK]  2159 Comprehensive metabolic panel(!) Cr increased [JK]  2235 Resp panel by  RT-PCR (RSV, Flu A&B, Covid) Anterior Nasal Swab Covid and flu are negative [  JK]  2252 Repeat exam.  Patient still having some wheezing and increased work of breathing.  Will add on breathing treatment [JK]    Clinical Course User Index [JK] Dorie Rank, MD                           Medical Decision Making Problems Addressed: Acute bronchitis, unspecified organism: acute illness or injury that poses a threat to life or bodily functions COPD exacerbation [facility]): acute illness or injury that poses a threat to life or bodily functions  Amount and/or Complexity of Data Reviewed Labs: ordered. Decision-making details documented in ED Course. Radiology: ordered and independent interpretation performed.  Risk OTC drugs. Prescription drug management.   Patient presented to the ED for evaluation of increased oxygen requirement today in the setting of recent cough congestion wheezing.  Patient was treated with steroids as well as breathing treatments at urgent care but her symptoms progressed.  Patient has new oxygen requirement.  Initially noted crackles on exam but no evidence of pneumonia on x-ray.  Doubt CHF.  Repeat exam she still has wheezing consistent with COPD exacerbation.  Laboratory tests do show leukocytosis but this may be related to recent steroid use.  She does not have a lactic acidosis.  Argues against evolving sepsis.  However with her new oxygen requirement and think she would benefit from position and further treatment including breathing treatments and steroids.  {Document critical care time when appropriate:1} {Document review of labs and clinical decision tools ie heart score, Chads2Vasc2 etc:1}  {Document your independent review of radiology images, and any outside records:1} {Document your discussion with family members, caretakers, and with consultants:1} {Document social determinants of health affecting pt's care:1} {Document your decision making why or why not admission,  treatments were needed:1} Final Clinical Impression(s) / ED Diagnoses Final diagnoses:  COPD exacerbation ([place])  Acute bronchitis, unspecified organism    Rx / DC Orders ED Discharge Orders     None

## 2022-12-10 NOTE — ED Triage Notes (Addendum)
Pt presents to ED from home with c/o sob that started today, pt was 89% in triage, pt taken to room 11, monitoring in place, pt assisted with getting in gown, pt O2 after this was 85% on room air- pt placed on O2 via [place] at 2L, sats quickly came up to 95%. Pt says she had PNA about 3 weeks ago, right after taking PNA vaccine- pt was seen urgent care today, given steroid shot and breathing tx, and prednisone pack, but pt did not pick this up from pharmacy. Pt has productive cough and labored breathing with exertion. Hx of COPD and asthma.

## 2022-12-11 ENCOUNTER — Observation Stay (HOSPITAL_COMMUNITY): Payer: Medicare PPO

## 2022-12-11 DIAGNOSIS — Z825 Family history of asthma and other chronic lower respiratory diseases: Secondary | ICD-10-CM | POA: Diagnosis not present

## 2022-12-11 DIAGNOSIS — Z811 Family history of alcohol abuse and dependence: Secondary | ICD-10-CM | POA: Diagnosis not present

## 2022-12-11 DIAGNOSIS — N1832 Chronic kidney disease, stage 3b: Secondary | ICD-10-CM

## 2022-12-11 DIAGNOSIS — I10 Essential (primary) hypertension: Secondary | ICD-10-CM | POA: Diagnosis not present

## 2022-12-11 DIAGNOSIS — J441 Chronic obstructive pulmonary disease with (acute) exacerbation: Secondary | ICD-10-CM

## 2022-12-11 DIAGNOSIS — I129 Hypertensive chronic kidney disease with stage 1 through stage 4 chronic kidney disease, or unspecified chronic kidney disease: Secondary | ICD-10-CM | POA: Diagnosis present

## 2022-12-11 DIAGNOSIS — F32A Depression, unspecified: Secondary | ICD-10-CM | POA: Diagnosis present

## 2022-12-11 DIAGNOSIS — Z1152 Encounter for screening for COVID-19: Secondary | ICD-10-CM | POA: Diagnosis not present

## 2022-12-11 DIAGNOSIS — J9601 Acute respiratory failure with hypoxia: Secondary | ICD-10-CM | POA: Diagnosis present

## 2022-12-11 DIAGNOSIS — E785 Hyperlipidemia, unspecified: Secondary | ICD-10-CM | POA: Diagnosis present

## 2022-12-11 DIAGNOSIS — A419 Sepsis, unspecified organism: Secondary | ICD-10-CM | POA: Diagnosis present

## 2022-12-11 DIAGNOSIS — Z87891 Personal history of nicotine dependence: Secondary | ICD-10-CM | POA: Diagnosis not present

## 2022-12-11 DIAGNOSIS — J440 Chronic obstructive pulmonary disease with acute lower respiratory infection: Secondary | ICD-10-CM | POA: Diagnosis present

## 2022-12-11 DIAGNOSIS — Z9049 Acquired absence of other specified parts of digestive tract: Secondary | ICD-10-CM | POA: Diagnosis not present

## 2022-12-11 DIAGNOSIS — R6510 Systemic inflammatory response syndrome (SIRS) of non-infectious origin without acute organ dysfunction: Secondary | ICD-10-CM | POA: Diagnosis not present

## 2022-12-11 DIAGNOSIS — J189 Pneumonia, unspecified organism: Secondary | ICD-10-CM | POA: Diagnosis present

## 2022-12-11 DIAGNOSIS — Z8042 Family history of malignant neoplasm of prostate: Secondary | ICD-10-CM | POA: Diagnosis not present

## 2022-12-11 DIAGNOSIS — Z823 Family history of stroke: Secondary | ICD-10-CM | POA: Diagnosis not present

## 2022-12-11 DIAGNOSIS — Z8249 Family history of ischemic heart disease and other diseases of the circulatory system: Secondary | ICD-10-CM | POA: Diagnosis not present

## 2022-12-11 DIAGNOSIS — N2581 Secondary hyperparathyroidism of renal origin: Secondary | ICD-10-CM | POA: Diagnosis present

## 2022-12-11 DIAGNOSIS — E1122 Type 2 diabetes mellitus with diabetic chronic kidney disease: Secondary | ICD-10-CM | POA: Diagnosis present

## 2022-12-11 DIAGNOSIS — Z79899 Other long term (current) drug therapy: Secondary | ICD-10-CM | POA: Diagnosis not present

## 2022-12-11 DIAGNOSIS — Z951 Presence of aortocoronary bypass graft: Secondary | ICD-10-CM | POA: Diagnosis not present

## 2022-12-11 DIAGNOSIS — Z90711 Acquired absence of uterus with remaining cervical stump: Secondary | ICD-10-CM | POA: Diagnosis not present

## 2022-12-11 DIAGNOSIS — Z7982 Long term (current) use of aspirin: Secondary | ICD-10-CM | POA: Diagnosis not present

## 2022-12-11 DIAGNOSIS — Z833 Family history of diabetes mellitus: Secondary | ICD-10-CM | POA: Diagnosis not present

## 2022-12-11 LAB — COMPREHENSIVE METABOLIC PANEL
ALT: 16 U/L (ref 0–44)
AST: 14 U/L — ABNORMAL LOW (ref 15–41)
Albumin: 3.2 g/dL — ABNORMAL LOW (ref 3.5–5.0)
Alkaline Phosphatase: 48 U/L (ref 38–126)
Anion gap: 6 (ref 5–15)
BUN: 16 mg/dL (ref 8–23)
CO2: 28 mmol/L (ref 22–32)
Calcium: 8.4 mg/dL — ABNORMAL LOW (ref 8.9–10.3)
Chloride: 107 mmol/L (ref 98–111)
Creatinine, Ser: 1.22 mg/dL — ABNORMAL HIGH (ref 0.44–1.00)
GFR, Estimated: 47 mL/min — ABNORMAL LOW (ref >60)
Glucose, Bld: 126 mg/dL — ABNORMAL HIGH (ref 70–99)
Potassium: 4.2 mmol/L (ref 3.5–5.1)
Sodium: 141 mmol/L (ref 135–145)
Total Bilirubin: 1 mg/dL (ref 0.3–1.2)
Total Protein: 6.3 g/dL — ABNORMAL LOW (ref 6.5–8.1)

## 2022-12-11 LAB — CBC WITH DIFFERENTIAL/PLATELET
Abs Immature Granulocytes: 0.09 10*3/uL — ABNORMAL HIGH (ref 0.00–0.07)
Basophils Absolute: 0 10*3/uL (ref 0.0–0.1)
Basophils Relative: 0 %
Eosinophils Absolute: 0 10*3/uL (ref 0.0–0.5)
Eosinophils Relative: 0 %
HCT: 33.9 % — ABNORMAL LOW (ref 36.0–46.0)
Hemoglobin: 11.2 g/dL — ABNORMAL LOW (ref 12.0–15.0)
Immature Granulocytes: 1 %
Lymphocytes Relative: 6 %
Lymphs Abs: 1 10*3/uL (ref 0.7–4.0)
MCH: 30.4 pg (ref 26.0–34.0)
MCHC: 33 g/dL (ref 30.0–36.0)
MCV: 92.1 fL (ref 80.0–100.0)
Monocytes Absolute: 0.3 10*3/uL (ref 0.1–1.0)
Monocytes Relative: 2 %
Neutro Abs: 14.5 10*3/uL — ABNORMAL HIGH (ref 1.7–7.7)
Neutrophils Relative %: 91 %
Platelets: 144 10*3/uL — ABNORMAL LOW (ref 150–400)
RBC: 3.68 MIL/uL — ABNORMAL LOW (ref 3.87–5.11)
RDW: 14.6 % (ref 11.5–15.5)
WBC: 15.8 10*3/uL — ABNORMAL HIGH (ref 4.0–10.5)
nRBC: 0 % (ref 0.0–0.2)

## 2022-12-11 LAB — RESPIRATORY PANEL BY PCR

## 2022-12-11 LAB — URINALYSIS, ROUTINE W REFLEX MICROSCOPIC
Bilirubin Urine: NEGATIVE
Glucose, UA: NEGATIVE mg/dL
Hgb urine dipstick: NEGATIVE
Ketones, ur: NEGATIVE mg/dL
Leukocytes,Ua: NEGATIVE
Nitrite: NEGATIVE
Protein, ur: NEGATIVE mg/dL
Specific Gravity, Urine: 1.012 (ref 1.005–1.030)
pH: 5 (ref 5.0–8.0)

## 2022-12-11 LAB — PROCALCITONIN: Procalcitonin: 0.95 ng/mL

## 2022-12-11 LAB — MAGNESIUM: Magnesium: 1.6 mg/dL — ABNORMAL LOW (ref 1.7–2.4)

## 2022-12-11 LAB — STREP PNEUMONIAE URINARY ANTIGEN: Strep Pneumo Urinary Antigen: NEGATIVE

## 2022-12-11 MED ORDER — IPRATROPIUM-ALBUTEROL 0.5-2.5 (3) MG/3ML IN SOLN: 3.0000 mL | Freq: Four times a day (QID) | RESPIRATORY_TRACT | Status: DC | PRN

## 2022-12-11 MED ORDER — IPRATROPIUM-ALBUTEROL 0.5-2.5 (3) MG/3ML IN SOLN
3.0000 mL | Freq: Three times a day (TID) | RESPIRATORY_TRACT | Status: DC
  Administered 2022-12-12: 3 mL via RESPIRATORY_TRACT
  Filled 2022-12-11: qty 3

## 2022-12-11 MED ORDER — UMECLIDINIUM BROMIDE 62.5 MCG/ACT IN AEPB
1.0000 | INHALATION_SPRAY | Freq: Every day | RESPIRATORY_TRACT | Status: DC
  Administered 2022-12-11 – 2022-12-12 (×2): 1 via RESPIRATORY_TRACT
  Filled 2022-12-11: qty 7

## 2022-12-11 MED ORDER — MOMETASONE FURO-FORMOTEROL FUM 100-5 MCG/ACT IN AERO
2.0000 | INHALATION_SPRAY | Freq: Two times a day (BID) | RESPIRATORY_TRACT | Status: DC
  Administered 2022-12-11 – 2022-12-12 (×3): 2 via RESPIRATORY_TRACT
  Filled 2022-12-11: qty 8.8

## 2022-12-11 MED ORDER — SODIUM CHLORIDE 0.9 % IV SOLN
500.0000 mg | INTRAVENOUS | Status: DC
  Administered 2022-12-11 – 2022-12-12 (×2): 500 mg via INTRAVENOUS
  Filled 2022-12-11 (×2): qty 5

## 2022-12-11 MED ORDER — SODIUM CHLORIDE 0.9 % IV SOLN
1.0000 g | INTRAVENOUS | Status: DC
  Administered 2022-12-11 – 2022-12-12 (×2): 1 g via INTRAVENOUS
  Filled 2022-12-11 (×2): qty 10

## 2022-12-11 MED ORDER — MAGNESIUM SULFATE 2 GM/50ML IV SOLN
2.0000 g | Freq: Once | INTRAVENOUS | Status: AC
  Administered 2022-12-11: 2 g via INTRAVENOUS
  Filled 2022-12-11: qty 50

## 2022-12-11 NOTE — Assessment & Plan Note (Signed)
-   Continue Norvasc, hydrochlorothiazide, metoprolol, olmesartan - Continue to monitor

## 2022-12-11 NOTE — H&P (Signed)
History and Physical    Patient: Debra [name] IHK:[id number] DOB: Feb 23, 1946 DOA: 12/10/2022 DOS: the patient was seen and examined on 12/11/2022 PCP: Coolidge Breeze, FNP  Patient coming from: Home  Chief Complaint:  Chief Complaint  Patient presents with   Shortness of Breath   HPI: Debra [name] is a 76 y.o. female with medical history significant of asthma, COPD, chronic kidney disease, diabetes mellitus type 2, hyperlipidemia, hypertension, and secondary hyperparathyroidism, presents to the ED with a chief complaint of cough and dyspnea.  Patient reports that it all started on 2 December.  She has seen outpatient providers for this.  She reports it was getting better and then on the day of presentation her symptoms suddenly got worse again.  She reports she finished her last antibiotic in the middle of last week.  She has had a couple steroid shots and the last one was more than a week ago.  Today she had fever and chills.  Her cough is productive of yellow sputum.  She denies chest pain, denies nausea and vomiting.  She did have dyspnea which is worse on exertion.  She had 1 loose, nonbloody bowel movement.  Primarily though she just could not catch her breath.  She does not wear oxygen at home.  She is requiring 3 L nasal cannula in the ED.  Patient has no other complaints at this time.  Patient does not smoke does not drink, does not use illicit drugs.  She is vaccinated for COVID.  Patient is full code. Review of Systems: As mentioned in the history of present illness. All other systems reviewed and are negative. Past Medical History:  Diagnosis Date   Allergic rhinitis    Arthritis    Asthma    Chronic kidney disease (CKD)    COPD (chronic obstructive pulmonary disease) ([place])    Diabetes mellitus without complication ([place])    Dyspnea    Hyperlipidemia    Hypertension    Hypertensive chronic kidney disease    Recurrent upper respiratory infection (URI)    Secondary  hyperparathyroidism of renal origin ([place])    Past Surgical History:  Procedure Laterality Date   CHOLECYSTECTOMY     CORONARY ARTERY BYPASS GRAFT     PARTIAL HYSTERECTOMY  1981   ovaries left in situ, was done for fibroids   ROBOTIC ASSISTED BILATERAL SALPINGO OOPHERECTOMY Bilateral 07/27/2022   Procedure: XI ROBOTIC [place],;  Surgeon: Lafonda Mosses, MD;  Location: [facility];  Service: Gynecology;  Laterality: Bilateral;   Social History:  reports that she quit smoking about 10 years ago. Her smoking use included cigarettes. She has never used smokeless tobacco. She reports that she does not drink alcohol and does not use drugs.  No Known Allergies  Family History  Problem Relation Age of Onset   Stroke Mother    Cancer Father    Prostate cancer Father    Alcohol abuse Sister    Heart attack Brother    Alcohol abuse Brother    Other Brother        MVA   Asthma Daughter    Miscarriages / [place] Daughter    Diabetes Son    Allergic rhinitis Neg Hx    Eczema Neg Hx    Immunodeficiency Neg Hx    Urticaria Neg Hx    Angioedema Neg Hx    Atopy Neg Hx     Prior to Admission medications   Medication Sig Start Date End Date  Taking? Authorizing Provider  acetaminophen (TYLENOL) 325 MG tablet Take 650 mg by mouth every 6 (six) hours as needed for moderate pain.    [provider]  albuterol (PROVENTIL) (2.5 MG/3ML) 0.083% nebulizer solution Take 2.5 mg by nebulization every 4 (four) hours as needed for wheezing or shortness of breath.    [provider]  amLODipine (NORVASC) 5 MG tablet Take 5 mg by mouth daily.    [provider]  aspirin 81 MG tablet Take 81 mg by mouth daily.    [provider]  atorvastatin (LIPITOR) 80 MG tablet Take 80 mg by mouth daily.    [provider]  Benralizumab (FASENRA) 30 MG/ML SOSY Inject 30 mg into the skin every 8 (eight) weeks.    [provider]   BREZTRI AEROSPHERE 160-9-4.8 MCG/ACT AERO Inhale 2 puffs into the lungs 2 (two) times daily. 09/17/22   Valentina Shaggy, MD  Calcium Carb-Cholecalciferol (CALCIUM 600+D3 PO) Take 1 tablet by mouth daily.    [provider]  Chlorphen-Phenyleph-APAP (SINUS CONGESTION/PAIN NIGHT) 2-5-325 MG TABS Take 1-2 tablets by mouth daily as needed (congestion).    [provider]  ciclopirox (LOPROX) 0.77 % cream Apply 1 Application topically 2 (two) times daily. Apply to toenails, and feet 07/07/22   [provider]  citalopram (CELEXA) 20 MG tablet Take 20 mg by mouth daily. 10/06/21   [provider]  dextromethorphan-guaiFENesin (MUCINEX DM) 30-600 MG 12hr tablet Take 1 tablet by mouth 2 (two) times daily as needed for cough.    [provider]  dextromethorphan-guaiFENesin (TUSSIN DM) 10-100 MG/5ML liquid Take 20 mLs by mouth daily as needed for cough.    [provider]  diclofenac Sodium (VOLTAREN) 1 % GEL as directed Externally    [provider]  fexofenadine (ALLEGRA) 60 MG tablet Take 60 mg by mouth daily.    [provider]  fluconazole (DIFLUCAN) 150 MG tablet Take 1 tablet (150 mg total) by mouth daily. 10/26/22   McKenzie, Candee Furbish, MD  fluticasone (FLONASE) 50 MCG/ACT nasal spray Place 1 spray into both nostrils daily as needed for allergies.    [provider]  furosemide (LASIX) 20 MG tablet Take 20 mg by mouth daily as needed for edema. 07/12/21   [provider]  gabapentin (NEURONTIN) 400 MG capsule Take 400 mg by mouth 3 (three) times daily. 07/22/21   [provider]  hydrochlorothiazide (HYDRODIURIL) 12.5 MG tablet Take 12.5 mg by mouth daily. 07/24/19   [provider]  ipratropium (ATROVENT) 0.06 % nasal spray Place 1 spray into both nostrils daily as needed for rhinitis.    [provider]  ipratropium-albuterol (DUONEB) 0.5-2.5 (3) MG/3ML SOLN Inhale 3 mLs into the lungs  every 4 (four) hours as needed (SOB/Wheezing). 06/20/18   [provider]  loratadine (CLARITIN) 10 MG tablet Take 10 mg by mouth daily as needed for allergies.    [provider]  metoprolol (LOPRESSOR) 100 MG tablet Take 100 mg by mouth 2 (two) times daily. 03/13/17   [provider]  mometasone (NASONEX) 50 MCG/ACT nasal spray Place 2 sprays into the nose daily as needed (allergies).    [provider]  montelukast (SINGULAIR) 10 MG tablet Take 1 tablet (10 mg total) by mouth daily. 09/17/22   Valentina Shaggy, MD  Multiple Vitamins-Minerals (MULTIVITAMIN WITH MINERALS) tablet Take 1 tablet by mouth daily.    [provider]  olmesartan (BENICAR) 20 MG tablet TAKE 1  TABLET BY MOUTH EVERY DAY 05/06/22   Tanda Rockers, MD  Omega-3 Fatty Acids (FISH OIL) 1000 MG CAPS Take 1,000 mg by mouth daily.    [provider]  Phenyleph-Doxylamine-DM-APAP (ALKA-SELTZER PLUS DAY/NIGHT PO) Take 1 capsule by mouth 2 (two) times daily as needed (congestion).    [provider]  Phenylephrine-DM-GG (MUCINEX FAST-MAX CONGEST COUGH) 2.5-5-100 MG/5ML LIQD Take 20 mLs by mouth daily as needed (congestion).    [provider]  sodium chloride (OCEAN) 0.65 % SOLN nasal spray Place 1 spray into both nostrils as needed for congestion.    [provider]  VENTOLIN HFA 108 (90 Base) MCG/ACT inhaler Inhale 2 puffs into the lungs every 4 (four) hours as needed for wheezing or shortness of breath. 09/17/22   Valentina Shaggy, MD  Vitamin D, Ergocalciferol, (DRISDOL) 1.25 MG (50000 UT) CAPS capsule Take 50,000 Units by mouth every 30 (thirty) days. 05/03/19   [provider]  zinc gluconate 50 MG tablet Take 50 mg by mouth daily.    [provider]    Physical Exam: Vitals:   12/10/22 2200 12/10/22 2315 12/11/22 0115 12/11/22 0201  BP: 120/68  (!) 109/55 (!) 121/59  Pulse: 76  69 62  Resp: (!) 27  20 (!) 21  Temp:  98.3  F (36.8 C) 98 F (36.7 C)   TempSrc:  Oral Oral   SpO2: 100%  97% 96%  Weight:      Height:       1.  General: Patient lying supine in bed,  no acute distress   2. Psychiatric: Alert and oriented x 3, mood and behavior normal for situation, pleasant and cooperative with exam   3. Neurologic: Speech and language are normal, face is symmetric, moves all 4 extremities voluntarily, at baseline without acute deficits on limited exam   4. HEENMT:  Head is atraumatic, normocephalic, pupils reactive to light, neck is supple, trachea is midline, mucous membranes are moist   5. Respiratory : Lungs are clear to auscultation bilaterally with minimal wheezing and no rhonchi, rales, no cyanosis, no increase in work of breathing or accessory muscle use   6. Cardiovascular : Heart rate normal, rhythm is regular, no murmurs, rubs or gallops, no peripheral edema, peripheral pulses palpated   7. Gastrointestinal:  Abdomen is soft, nondistended, nontender to palpation bowel sounds active, no masses or organomegaly palpated   8. Skin:  Skin is warm, dry and intact without rashes, acute lesions, or ulcers on limited exam   9.Musculoskeletal:  No acute deformities or trauma, no asymmetry in tone, no peripheral edema, peripheral pulses palpated, no tenderness to palpation in the extremities  Data Reviewed: In the ED Temp 101.2, heart rate 76-84, respiratory rate 19-27, blood pressure 111/55-147/68 Patient satting as low as 84% on room air Patient now satting 100% on 3 L nasal cannula Leukocytosis at 23.3, hemoglobin 11.5, platelets 165 Chemistry reveals a creatinine that is slightly elevated but at baseline 1.25 Negative COVID and flu UA pending Blood culture pending Chest x-ray shows mild vascular congestion without edema or effusion Patient received Solu-Medrol and breathing treatments in the ED Admission requested for COPD exacerbation  Assessment and Plan: * SIRS (systemic inflammatory  response syndrome) (HCC) - Temp 101.2, respiratory rate 27, white blood cell count 23.3 - Endorgan damage with acute respiratory failure with hypoxia requiring 3 L nasal cannula - Blood culture pending - Chest x-ray shows mild vascular congestion without edema or effusion - Suspected pneumonia  but not seen on chest x-ray - Start Zithromax and Rocephin - Sputum culture pending - Procalcitonin pending - Continue to monitor  Depression Continue Celexa  COPD with acute exacerbation (HCC) - Continue breztri and Singulair - Continue steroids, breathing treatments - Likely triggered by pneumonia - Continue to monitor  Essential hypertension - Continue Norvasc, hydrochlorothiazide, metoprolol, olmesartan - Continue to monitor      Advance Care Planning:   Code Status: Full Code  Consults: None  Family Communication: No family at bedside  Severity of Illness: The appropriate patient status for this patient is OBSERVATION. Observation status is judged to be reasonable and necessary in order to provide the required intensity of service to ensure the patient's safety. The patient's presenting symptoms, physical exam findings, and initial radiographic and laboratory data in the context of their medical condition is felt to place them at decreased risk for further clinical deterioration. Furthermore, it is anticipated that the patient will be medically stable for discharge from the hospital within 2 midnights of admission.   Author: Rolla Plate, DO 12/11/2022 3:52 AM  For on call review [email or web address].

## 2022-12-11 NOTE — Assessment & Plan Note (Signed)
-   Not on oxygen at home - Suspected in setting of underlying pneumonia and COPD exacerbation - Weaned to RA prior to discharge; ambulated with no desaturations

## 2022-12-11 NOTE — Assessment & Plan Note (Signed)
-  patient has history of CKD3b. Baseline creat ~ 1.2 - 1.3, eGFR 40 - at baseline  

## 2022-12-11 NOTE — ED Notes (Signed)
Breakfast tray given. °

## 2022-12-11 NOTE — Assessment & Plan Note (Addendum)
-   Continue breztri and Singulair - Continue steroids, breathing treatments - Likely triggered by pneumonia, see above  - Continue to monitor

## 2022-12-11 NOTE — Progress Notes (Addendum)
Progress Note    Debra [name]   GEZ:[id number]  DOB: 1946-04-17  DOA: 12/10/2022     0 PCP: Coolidge Breeze, FNP  Initial CC: SOB  Hospital Course: Debra [name] is a 76 y.o. female with PMH asthma, COPD, CKD, DMII, HLD, HTN, secondary hyperparathyroidism who presented with cough and dyspnea.  She reports symptoms initially started the beginning of December and she was started on an antibiotic.  She also had a recent allergy shot just prior to admission and then felt worse.  On presentation to the ER she was complaining of a cough productive of yellow sputum and fever/chills.  Dyspnea was also her biggest complaint.  She was found to be hypoxic and placed on 3 L oxygen as well. CXR was obtained on admission which did not show obvious infiltrates however suspicion was high for pneumonia on admission and she was started on antibiotics.  Steroids were also initiated due to COPD exacerbation suspected from underlying pneumonia.  Interval History:  Seen this morning in the ER.  Breathing had improved some, still on oxygen but less short of breath.  Assessment and Plan: * CAP (community acquired pneumonia) - Temp 101.2, respiratory rate 27, white blood cell count 23.3 -Initial CXR showed only subtle signs of congestion but repeat CXR on 12/11/2022 showing more haziness over the left mid and lower lung raising further suspicion for infection/pneumonia - PCT also elevated, 0.95, continue trending - Continue Rocephin and azithromycin - Check strep and urine Legionella antigens - Check RVP  COPD with acute exacerbation (HCC) - Continue breztri and Singulair - Continue steroids, breathing treatments - Likely triggered by pneumonia - Continue to monitor  Acute respiratory failure with hypoxia ([place]) - Not on oxygen at home - Suspected in setting of underlying pneumonia and COPD exacerbation - Wean as able  Chronic kidney disease, stage 3b ([place]) - patient has history of CKD3b. Baseline  creat ~ 1.2 - 1.3, eGFR 40 - at baseline    Depression Continue Celexa  Essential hypertension - Continue Norvasc, hydrochlorothiazide, metoprolol, olmesartan - Continue to monitor   Old records reviewed in assessment of this patient  Antimicrobials: Azithromycin 12/11/2022 >> current Rocephin 12/11/2022 >> current  DVT prophylaxis:  heparin injection 5,000 Units Start: 12/10/22 2345 SCDs Start: 12/10/22 2333   Code Status:   Code Status: Full Code  Mobility Assessment (last 72 hours)     Mobility Assessment     Row Name 12/11/22 04:39:48           Does patient have an order for bedrest or is patient medically unstable No - Continue assessment       What is the highest level of mobility based on the progressive mobility assessment? Level 6 (Walks independently in room and hall) - Balance while walking in room without assist - Complete                Barriers to discharge:  Disposition Plan: Home 1 to 2 days Status is: OBS needing change to inpatient  Objective: Blood pressure 139/72, pulse 73, temperature 98.1 F (36.7 C), temperature source Oral, resp. rate 18, height 5' 2" (1.575 m), weight 81.6 kg, SpO2 95 %.  Examination:  Physical Exam Constitutional:      General: She is not in acute distress.    Appearance: Normal appearance.  HENT:     Head: Normocephalic and atraumatic.     Mouth/Throat:     Mouth: Mucous membranes are moist.  Eyes:  Extraocular Movements: Extraocular movements intact.  Cardiovascular:     Rate and Rhythm: Normal rate and regular rhythm.  Pulmonary:     Breath sounds: No wheezing.     Comments: Coarse breath sounds bilaterally Abdominal:     General: Bowel sounds are normal. There is no distension.     Palpations: Abdomen is soft.     Tenderness: There is no abdominal tenderness.  Musculoskeletal:        General: Normal range of motion.     Cervical back: Normal range of motion and neck supple.  Skin:    General:  Skin is warm and dry.  Neurological:     General: No focal deficit present.     Mental Status: She is alert.  Psychiatric:        Mood and Affect: Mood normal.        Behavior: Behavior normal.      Consultants:    Procedures:    Data Reviewed: Results for orders placed or performed during the hospital encounter of 12/10/22 (from the past 24 hour(s))  Comprehensive metabolic panel     Status: Abnormal   Collection Time: 12/10/22  8:44 PM  Result Value Ref Range   Sodium 141 135 - 145 mmol/L   Potassium 3.8 3.5 - 5.1 mmol/L   Chloride 105 98 - 111 mmol/L   CO2 27 22 - 32 mmol/L   Glucose, Bld 106 (H) 70 - 99 mg/dL   BUN 14 8 - 23 mg/dL   Creatinine, Ser 1.25 (H) 0.44 - 1.00 mg/dL   Calcium 8.8 (L) 8.9 - 10.3 mg/dL   Total Protein 6.9 6.5 - 8.1 g/dL   Albumin 3.5 3.5 - 5.0 g/dL   AST 15 15 - 41 U/L   ALT 17 0 - 44 U/L   Alkaline Phosphatase 54 38 - 126 U/L   Total Bilirubin 0.9 0.3 - 1.2 mg/dL   GFR, Estimated 45 (L) >60 mL/min   Anion gap 9 5 - 15  Lactic acid, plasma     Status: None   Collection Time: 12/10/22  8:44 PM  Result Value Ref Range   Lactic Acid, Venous 1.2 0.5 - 1.9 mmol/L  CBC with Differential     Status: Abnormal   Collection Time: 12/10/22  8:44 PM  Result Value Ref Range   WBC 23.3 (H) 4.0 - 10.5 K/uL   RBC 3.89 3.87 - 5.11 MIL/uL   Hemoglobin 11.5 (L) 12.0 - 15.0 g/dL   HCT 35.1 (L) 36.0 - 46.0 %   MCV 90.2 80.0 - 100.0 fL   MCH 29.6 26.0 - 34.0 pg   MCHC 32.8 30.0 - 36.0 g/dL   RDW 14.8 11.5 - 15.5 %   Platelets 165 150 - 400 K/uL   nRBC 0.1 0.0 - 0.2 %   Neutrophils Relative % 78 %   Neutro Abs 16.3 (H) 1.7 - 7.7 K/uL   Lymphocytes Relative 14 %   Lymphs Abs 2.9 0.7 - 4.0 K/uL   Monocytes Relative 7 %   Monocytes Absolute 1.6 (H) 0.1 - 1.0 K/uL   Eosinophils Relative 0 %   Eosinophils Absolute 0.1 0.0 - 0.5 K/uL   Basophils Relative 0 %   Basophils Absolute 0.0 0.0 - 0.1 K/uL   Immature Granulocytes 1 %   Abs Immature Granulocytes  0.16 (H) 0.00 - 0.07 K/uL  Protime-INR     Status: None   Collection Time: 12/10/22  8:44 PM  Result Value Ref Range  Prothrombin Time 13.0 11.4 - 15.2 seconds   INR 1.0 0.8 - 1.2  Culture, blood (Routine x 2)     Status: None (Preliminary result)   Collection Time: 12/10/22  8:44 PM   Specimen: BLOOD RIGHT FOREARM  Result Value Ref Range   Specimen Description BLOOD RIGHT FOREARM    Special Requests      BOTTLES DRAWN AEROBIC AND ANAEROBIC Blood Culture adequate volume   Culture      NO GROWTH < 12 HOURS Performed at [facility], [address] [place], [place] [address]    Report Status PENDING   Culture, blood (Routine x 2)     Status: None (Preliminary result)   Collection Time: 12/10/22  9:06 PM   Specimen: Site Not Specified; Blood  Result Value Ref Range   Specimen Description      SITE NOT SPECIFIED BOTTLES DRAWN AEROBIC AND ANAEROBIC   Special Requests Blood Culture adequate volume    Culture      NO GROWTH < 12 HOURS Performed at [facility], [address] [place], [place] [address]    Report Status PENDING   Resp panel by RT-PCR (RSV, Flu A&B, Covid) Anterior Nasal Swab     Status: None   Collection Time: 12/10/22  9:17 PM   Specimen: Anterior Nasal Swab  Result Value Ref Range   SARS Coronavirus 2 by RT PCR NEGATIVE NEGATIVE   Influenza A by PCR NEGATIVE NEGATIVE   Influenza B by PCR NEGATIVE NEGATIVE   Resp Syncytial Virus by PCR NEGATIVE NEGATIVE  Comprehensive metabolic panel     Status: Abnormal   Collection Time: 12/11/22  3:57 AM  Result Value Ref Range   Sodium 141 135 - 145 mmol/L   Potassium 4.2 3.5 - 5.1 mmol/L   Chloride 107 98 - 111 mmol/L   CO2 28 22 - 32 mmol/L   Glucose, Bld 126 (H) 70 - 99 mg/dL   BUN 16 8 - 23 mg/dL   Creatinine, Ser 1.22 (H) 0.44 - 1.00 mg/dL   Calcium 8.4 (L) 8.9 - 10.3 mg/dL   Total Protein 6.3 (L) 6.5 - 8.1 g/dL   Albumin 3.2 (L) 3.5 - 5.0 g/dL   AST 14 (L) 15 - 41 U/L   ALT 16 0 - 44 U/L   Alkaline Phosphatase  48 38 - 126 U/L   Total Bilirubin 1.0 0.3 - 1.2 mg/dL   GFR, Estimated 47 (L) >60 mL/min   Anion gap 6 5 - 15  Magnesium     Status: Abnormal   Collection Time: 12/11/22  3:57 AM  Result Value Ref Range   Magnesium 1.6 (L) 1.7 - 2.4 mg/dL  Procalcitonin - Baseline     Status: None   Collection Time: 12/11/22  3:57 AM  Result Value Ref Range   Procalcitonin 0.95 ng/mL  CBC with Differential/Platelet     Status: Abnormal   Collection Time: 12/11/22  5:56 AM  Result Value Ref Range   WBC 15.8 (H) 4.0 - 10.5 K/uL   RBC 3.68 (L) 3.87 - 5.11 MIL/uL   Hemoglobin 11.2 (L) 12.0 - 15.0 g/dL   HCT 33.9 (L) 36.0 - 46.0 %   MCV 92.1 80.0 - 100.0 fL   MCH 30.4 26.0 - 34.0 pg   MCHC 33.0 30.0 - 36.0 g/dL   RDW 14.6 11.5 - 15.5 %   Platelets 144 (L) 150 - 400 K/uL   nRBC 0.0 0.0 - 0.2 %   Neutrophils Relative %  91 %   Neutro Abs 14.5 (H) 1.7 - 7.7 K/uL   Lymphocytes Relative 6 %   Lymphs Abs 1.0 0.7 - 4.0 K/uL   Monocytes Relative 2 %   Monocytes Absolute 0.3 0.1 - 1.0 K/uL   Eosinophils Relative 0 %   Eosinophils Absolute 0.0 0.0 - 0.5 K/uL   Basophils Relative 0 %   Basophils Absolute 0.0 0.0 - 0.1 K/uL   Immature Granulocytes 1 %   Abs Immature Granulocytes 0.09 (H) 0.00 - 0.07 K/uL    I have Reviewed nursing notes, Vitals, and Lab results since pt's last encounter. Pertinent lab results : see above I have ordered labwork to follow up on.  I have reviewed the last note from staff over past 24 hours I have discussed pt's care plan and test results with nursing staff, CM/SW, and other staff as appropriate  Time spent: Greater than 50% of the 55 minute visit was spent in counseling/coordination of care for the patient as laid out in the A&P.   LOS: 0 days   Dwyane Dee, MD [facility] 12/11/2022, 3:15 PM

## 2022-12-11 NOTE — Hospital Course (Signed)
Debra [name] is a 76 y.o. female with PMH asthma, COPD, CKD, DMII, HLD, HTN, secondary hyperparathyroidism who presented with cough and dyspnea.  She reports symptoms initially started the beginning of December and she was started on an antibiotic.  She also had a recent allergy shot just prior to admission and then felt worse.  On presentation to the ER she was complaining of a cough productive of yellow sputum and fever/chills.  Dyspnea was also her biggest complaint.  She was found to be hypoxic and placed on 3 L oxygen as well. CXR was obtained on admission which did not show obvious infiltrates however suspicion was high for pneumonia on admission and she was started on antibiotics.  Steroids were also initiated due to COPD exacerbation suspected from underlying pneumonia.

## 2022-12-11 NOTE — Assessment & Plan Note (Addendum)
-   Temp 101.2, respiratory rate 27, white blood cell count 23.3 -Initial CXR showed only subtle signs of congestion but repeat CXR on 12/11/2022 showing more haziness over the left mid and lower lung raising further suspicion for infection/pneumonia - PCT also elevated, 0.95, continue trending - Continue Rocephin and azithromycin - Check strep and urine Legionella antigens - Check RVP

## 2022-12-11 NOTE — Assessment & Plan Note (Signed)
Continue Celexa

## 2022-12-12 DIAGNOSIS — J9601 Acute respiratory failure with hypoxia: Secondary | ICD-10-CM

## 2022-12-12 DIAGNOSIS — A419 Sepsis, unspecified organism: Secondary | ICD-10-CM

## 2022-12-12 DIAGNOSIS — J189 Pneumonia, unspecified organism: Secondary | ICD-10-CM

## 2022-12-12 DIAGNOSIS — J441 Chronic obstructive pulmonary disease with (acute) exacerbation: Secondary | ICD-10-CM | POA: Diagnosis not present

## 2022-12-12 LAB — BASIC METABOLIC PANEL
Anion gap: 9 (ref 5–15)
BUN: 28 mg/dL — ABNORMAL HIGH (ref 8–23)
CO2: 25 mmol/L (ref 22–32)
Calcium: 8.5 mg/dL — ABNORMAL LOW (ref 8.9–10.3)
Chloride: 106 mmol/L (ref 98–111)
Creatinine, Ser: 1.26 mg/dL — ABNORMAL HIGH (ref 0.44–1.00)
GFR, Estimated: 45 mL/min — ABNORMAL LOW (ref >60)
Glucose, Bld: 188 mg/dL — ABNORMAL HIGH (ref 70–99)
Potassium: 3.9 mmol/L (ref 3.5–5.1)
Sodium: 140 mmol/L (ref 135–145)

## 2022-12-12 LAB — CBC WITH DIFFERENTIAL/PLATELET
Abs Immature Granulocytes: 0.14 10*3/uL — ABNORMAL HIGH (ref 0.00–0.07)
Basophils Absolute: 0 10*3/uL (ref 0.0–0.1)
Basophils Relative: 0 %
Eosinophils Absolute: 0 10*3/uL (ref 0.0–0.5)
Eosinophils Relative: 0 %
HCT: 30.8 % — ABNORMAL LOW (ref 36.0–46.0)
Hemoglobin: 10.3 g/dL — ABNORMAL LOW (ref 12.0–15.0)
Immature Granulocytes: 1 %
Lymphocytes Relative: 6 %
Lymphs Abs: 0.9 10*3/uL (ref 0.7–4.0)
MCH: 30.1 pg (ref 26.0–34.0)
MCHC: 33.4 g/dL (ref 30.0–36.0)
MCV: 90.1 fL (ref 80.0–100.0)
Monocytes Absolute: 0.3 10*3/uL (ref 0.1–1.0)
Monocytes Relative: 2 %
Neutro Abs: 14.5 10*3/uL — ABNORMAL HIGH (ref 1.7–7.7)
Neutrophils Relative %: 91 %
Platelets: 149 10*3/uL — ABNORMAL LOW (ref 150–400)
RBC: 3.42 MIL/uL — ABNORMAL LOW (ref 3.87–5.11)
RDW: 14.5 % (ref 11.5–15.5)
WBC: 15.9 10*3/uL — ABNORMAL HIGH (ref 4.0–10.5)
nRBC: 0 % (ref 0.0–0.2)

## 2022-12-12 LAB — MAGNESIUM: Magnesium: 2.2 mg/dL (ref 1.7–2.4)

## 2022-12-12 MED ORDER — AMOXICILLIN-POT CLAVULANATE 875-125 MG PO TABS: 1.0000 | ORAL_TABLET | Freq: Two times a day (BID) | ORAL | 0 refills | Status: AC

## 2022-12-12 MED ORDER — PREDNISONE 20 MG PO TABS: 40.0000 mg | ORAL_TABLET | Freq: Every day | ORAL | 0 refills | Status: AC

## 2022-12-12 NOTE — Assessment & Plan Note (Signed)
See CAP 

## 2022-12-12 NOTE — Progress Notes (Signed)
Pt has discharge orders, discharge teaching given and no further questions at this time. Pt wheeled down to main lobby by staff via w/c to vehicle accompanied by son.

## 2022-12-12 NOTE — TOC Progression Note (Signed)
  Transition of Care [facility]) Screening Note   Patient Details  Name: Debra [name] Date of Birth: 06/16/1946   Transition of Care [facility]) CM/SW Contact:    Shade Flood, LCSW Phone Number: 12/12/2022, 9:18 AM    Transition of Care Department [facility]) has reviewed patient and no TOC needs have been identified at this time. We will continue to monitor patient advancement through interdisciplinary progression rounds. If new patient transition needs arise, please place a TOC consult.

## 2022-12-12 NOTE — Discharge Summary (Signed)
Physician Discharge Summary   Debra [name] OLM:[id number] DOB: 1946-07-22 DOA: 12/10/2022  PCP: Coolidge Breeze, FNP  Admit date: 12/10/2022 Discharge date: 12/12/2022  Barriers to discharge: none  Admitted From: Home Disposition:  Home Discharging physician: Dwyane Dee, MD  Recommendations for Outpatient Follow-up:  Continue chronic management   Home Health:  Equipment/Devices:   Discharge Condition: stable CODE STATUS: Full Diet recommendation:  Diet Orders (From admission, onward)     Start     Ordered   12/12/22 0000  Diet - low sodium heart healthy        12/12/22 1020            Hospital Course: Debra [name] is a 76 y.o. female with PMH asthma, COPD, CKD, DMII, HLD, HTN, secondary hyperparathyroidism who presented with cough and dyspnea.  She reports symptoms initially started the beginning of December and she was started on an antibiotic.  She also had a recent allergy shot just prior to admission and then felt worse.  On presentation to the ER she was complaining of a cough productive of yellow sputum and fever/chills.  Dyspnea was also her biggest complaint.  She was found to be hypoxic and placed on 3 L oxygen as well. CXR was obtained on admission which did not show obvious infiltrates however suspicion was high for pneumonia on admission and she was started on antibiotics.  Steroids were also initiated due to COPD exacerbation suspected from underlying pneumonia.  Assessment and Plan: * CAP (community acquired pneumonia) - Temp 101.2, respiratory rate 27, white blood cell count 23.3 -Initial CXR showed only subtle signs of congestion but repeat CXR on 12/11/2022 showing more haziness over the left mid and lower lung raising further suspicion for infection/pneumonia - PCT also elevated, 0.95 - s/p Rocephin and azithromycin; continue on Augmentin at discharge to complete course - Check strep (negative) and urine Legionella (pending) antigens - Check RVP,  positive for Coronavirus HKU1, likely the culprit of COPD exac  COPD with acute exacerbation (HCC) - Continue breztri and Singulair - Continue steroids, breathing treatments - Likely triggered by pneumonia, see above  - Continue to monitor  Acute respiratory failure with hypoxia (HCC)-resolved as of 12/12/2022 - Not on oxygen at home - Suspected in setting of underlying pneumonia and COPD exacerbation - Weaned to RA prior to discharge; ambulated with no desaturations  Chronic kidney disease, stage 3b ([place]) - patient has history of CKD3b. Baseline creat ~ 1.2 - 1.3, eGFR 40 - at baseline    Sepsis ([place]) See CAP  Depression Continue Celexa  Essential hypertension - Continue Norvasc, hydrochlorothiazide, metoprolol, olmesartan       The patient's chronic medical conditions were treated accordingly per the patient's home medication regimen except as noted.  On day of discharge, patient was felt deemed stable for discharge. Patient/family member advised to call PCP or come back to ER if needed.   Principal Diagnosis: CAP (community acquired pneumonia)  Discharge Diagnoses: Active Hospital Problems   Diagnosis Date Noted   CAP (community acquired pneumonia) 12/10/2022    Priority: 1.   COPD with acute exacerbation ([place]) 12/11/2022    Priority: 2.   Chronic kidney disease, stage 3b ([place]) 12/11/2022    Priority: 4.   Depression 12/11/2022   Sepsis ([place]) 12/11/2022   Essential hypertension 06/11/2021    Resolved Hospital Problems   Diagnosis Date Noted Date Resolved   Acute respiratory failure with hypoxia [facility]) 12/11/2022 12/12/2022    Priority: 3.  Discharge Instructions     Diet - low sodium heart healthy   Complete by: As directed    Increase activity slowly   Complete by: As directed       Allergies as of 12/12/2022   No Known Allergies      Medication List     TAKE these medications    acetaminophen 325 MG tablet Commonly known as:  TYLENOL Take 650 mg by mouth every 6 (six) hours as needed for moderate pain.   ALKA-SELTZER PLUS DAY/NIGHT PO Take 1 capsule by mouth 2 (two) times daily as needed (congestion).   amLODipine 5 MG tablet Commonly known as: NORVASC Take 5 mg by mouth daily.   amoxicillin-clavulanate 875-125 MG tablet Commonly known as: AUGMENTIN Take 1 tablet by mouth 2 (two) times daily for 4 days.   aspirin 81 MG tablet Take 81 mg by mouth daily.   atorvastatin 80 MG tablet Commonly known as: LIPITOR Take 80 mg by mouth daily.   Breztri Aerosphere 160-9-4.8 MCG/ACT Aero Generic drug: Budeson-Glycopyrrol-Formoterol Inhale 2 puffs into the lungs 2 (two) times daily.   CALCIUM 600+D3 PO Take 1 tablet by mouth daily.   ciclopirox 0.77 % cream Commonly known as: LOPROX Apply 1 Application topically 2 (two) times daily as needed (irritation on feet.). Apply to toenails, and feet   citalopram 20 MG tablet Commonly known as: CELEXA Take 20 mg by mouth daily.   dextromethorphan-guaiFENesin 30-600 MG 12hr tablet Commonly known as: MUCINEX DM Take 1 tablet by mouth 2 (two) times daily as needed for cough.   Tussin DM 10-100 MG/5ML liquid Generic drug: dextromethorphan-guaiFENesin Take 20 mLs by mouth daily as needed for cough.   Fasenra 30 MG/ML Sosy Generic drug: Benralizumab Inject 30 mg into the skin every 8 (eight) weeks.   fexofenadine 60 MG tablet Commonly known as: ALLEGRA Take 60 mg by mouth daily.   fluticasone 50 MCG/ACT nasal spray Commonly known as: FLONASE Place 1 spray into both nostrils daily as needed for allergies.   furosemide 20 MG tablet Commonly known as: LASIX Take 20 mg by mouth daily as needed for edema.   gabapentin 400 MG capsule Commonly known as: NEURONTIN Take 400 mg by mouth 3 (three) times daily.   hydrochlorothiazide 12.5 MG tablet Commonly known as: HYDRODIURIL Take 12.5 mg by mouth daily.   ipratropium 0.06 % nasal spray Commonly known as:  ATROVENT Place 1 spray into both nostrils daily as needed for rhinitis.   ipratropium-albuterol 0.5-2.5 (3) MG/3ML Soln Commonly known as: DUONEB Inhale 3 mLs into the lungs every 4 (four) hours as needed (SOB/Wheezing).   loratadine 10 MG tablet Commonly known as: CLARITIN Take 10 mg by mouth daily as needed for allergies.   metoprolol tartrate 100 MG tablet Commonly known as: LOPRESSOR Take 100 mg by mouth 2 (two) times daily.   mometasone 50 MCG/ACT nasal spray Commonly known as: NASONEX Place 2 sprays into the nose daily as needed (allergies).   montelukast 10 MG tablet Commonly known as: SINGULAIR Take 1 tablet (10 mg total) by mouth daily.   Mucinex Fast-Max Congest Cough 2.5-5-100 MG/5ML Liqd Generic drug: Phenylephrine-DM-GG Take 20 mLs by mouth daily as needed (congestion).   multivitamin with minerals tablet Take 1 tablet by mouth daily.   olmesartan 20 MG tablet Commonly known as: BENICAR TAKE 1 TABLET BY MOUTH EVERY DAY   predniSONE 20 MG tablet Commonly known as: DELTASONE Take 2 tablets (40 mg total) by mouth daily with breakfast for  4 days. Start taking on: December 13, 2022   promethazine-dextromethorphan 6.25-15 MG/5ML syrup Commonly known as: PROMETHAZINE-DM Take 5 mLs by mouth 4 (four) times daily as needed for cough.   Sinus Congestion/Pain Night 2-5-325 MG Tabs Generic drug: Chlorphen-Phenyleph-APAP Take 1-2 tablets by mouth daily as needed (congestion).   sodium chloride 0.65 % Soln nasal spray Commonly known as: OCEAN Place 1 spray into both nostrils as needed for congestion.   Ventolin HFA 108 (90 Base) MCG/ACT inhaler Generic drug: albuterol Inhale 2 puffs into the lungs every 4 (four) hours as needed for wheezing or shortness of breath.   Vitamin D (Ergocalciferol) 1.25 MG (50000 UNIT) Caps capsule Commonly known as: DRISDOL Take 50,000 Units by mouth every 30 (thirty) days.   Voltaren 1 % Gel Generic drug: diclofenac Sodium Apply  2 g topically 4 (four) times daily as needed (pain).   zinc gluconate 50 MG tablet Take 50 mg by mouth daily.        No Known Allergies  Consultations:   Procedures:   Discharge Exam: BP (!) 143/78 (BP Location: Left Arm)   Pulse 71   Temp 98.1 F (36.7 C) (Oral)   Resp 19   Ht _0  (1.575 m)   Wt 81.6 kg   SpO2 93%   BMI 32.92 kg/m  Physical Exam Constitutional:      General: She is not in acute distress.    Appearance: Normal appearance.  HENT:     Head: Normocephalic and atraumatic.     Mouth/Throat:     Mouth: Mucous membranes are moist.  Eyes:     Extraocular Movements: Extraocular movements intact.  Cardiovascular:     Rate and Rhythm: Normal rate and regular rhythm.  Pulmonary:     Breath sounds: No wheezing.     Comments: Coarse breath sounds bilaterally Abdominal:     General: Bowel sounds are normal. There is no distension.     Palpations: Abdomen is soft.     Tenderness: There is no abdominal tenderness.  Musculoskeletal:        General: Normal range of motion.     Cervical back: Normal range of motion and neck supple.  Skin:    General: Skin is warm and dry.  Neurological:     General: No focal deficit present.     Mental Status: She is alert.  Psychiatric:        Mood and Affect: Mood normal.        Behavior: Behavior normal.      The results of significant diagnostics from this hospitalization (including imaging, microbiology, ancillary and laboratory) are listed below for reference.   Microbiology: Recent Results (from the past 240 hour(s))  Culture, blood (Routine x 2)     Status: None (Preliminary result)   Collection Time: 12/10/22  8:44 PM   Specimen: BLOOD RIGHT FOREARM  Result Value Ref Range Status   Specimen Description BLOOD RIGHT FOREARM  Final   Special Requests   Final    BOTTLES DRAWN AEROBIC AND ANAEROBIC Blood Culture adequate volume   Culture   Final    NO GROWTH 2 DAYS Performed at [facility], [address], [place], [place] [address]    Report Status PENDING  Incomplete  Culture, blood (Routine x 2)     Status: None (Preliminary result)   Collection Time: 12/10/22  9:06 PM   Specimen: Site Not Specified; Blood  Result Value Ref Range Status   Specimen Description  Final    SITE NOT SPECIFIED BOTTLES DRAWN AEROBIC AND ANAEROBIC   Special Requests Blood Culture adequate volume  Final   Culture   Final    NO GROWTH 2 DAYS Performed at [facility], [address] [place], [place] [address]    Report Status PENDING  Incomplete  Resp panel by RT-PCR (RSV, Flu A&B, Covid) Anterior Nasal Swab     Status: None   Collection Time: 12/10/22  9:17 PM   Specimen: Anterior Nasal Swab  Result Value Ref Range Status   SARS Coronavirus 2 by RT PCR NEGATIVE NEGATIVE Final    Comment: (NOTE) SARS-CoV-2 target nucleic acids are NOT DETECTED.  The SARS-CoV-2 RNA is generally detectable in upper respiratory specimens during the acute phase of infection. The lowest concentration of SARS-CoV-2 viral copies this assay can detect is 138 copies/mL. A negative result does not preclude SARS-Cov-2 infection and should not be used as the sole basis for treatment or other patient management decisions. A negative result may occur with  improper specimen collection/handling, submission of specimen other than nasopharyngeal swab, presence of viral mutation(s) within the areas targeted by this assay, and inadequate number of viral copies(<138 copies/mL). A negative result must be combined with clinical observations, patient history, and epidemiological information. The expected result is Negative.  Fact Sheet for Patients:  EntrepreneurPulse.com.au  Fact Sheet for Healthcare Providers:  IncredibleEmployment.be  This test is no t yet approved or cleared by the [place] FDA and  has been authorized for detection and/or diagnosis of SARS-CoV-2 by FDA under an Emergency Use  Authorization (EUA). This EUA will remain  in effect (meaning this test can be used) for the duration of the COVID-19 declaration under Section 564(b)(1) of the Act, 21 U.S.C.section 360bbb-3(b)(1), unless the authorization is terminated  or revoked sooner.       Influenza A by PCR NEGATIVE NEGATIVE Final   Influenza B by PCR NEGATIVE NEGATIVE Final    Comment: (NOTE) The Xpert Xpress SARS-CoV-2/FLU/RSV plus assay is intended as an aid in the diagnosis of influenza from Nasopharyngeal swab specimens and should not be used as a sole basis for treatment. Nasal washings and aspirates are unacceptable for Xpert Xpress SARS-CoV-2/FLU/RSV testing.  Fact Sheet for Patients: EntrepreneurPulse.com.au  Fact Sheet for Healthcare Providers: IncredibleEmployment.be  This test is not yet approved or cleared by the [place] FDA and has been authorized for detection and/or diagnosis of SARS-CoV-2 by FDA under an Emergency Use Authorization (EUA). This EUA will remain in effect (meaning this test can be used) for the duration of the COVID-19 declaration under Section 564(b)(1) of the Act, 21 U.S.C. section 360bbb-3(b)(1), unless the authorization is terminated or revoked.     Resp Syncytial Virus by PCR NEGATIVE NEGATIVE Final    Comment: (NOTE) Fact Sheet for Patients: EntrepreneurPulse.com.au  Fact Sheet for Healthcare Providers: IncredibleEmployment.be  This test is not yet approved or cleared by the [place] FDA and has been authorized for detection and/or diagnosis of SARS-CoV-2 by FDA under an Emergency Use Authorization (EUA). This EUA will remain in effect (meaning this test can be used) for the duration of the COVID-19 declaration under Section 564(b)(1) of the Act, 21 U.S.C. section 360bbb-3(b)(1), unless the authorization is terminated or revoked.  Performed at [facility], [address]  [place], [place] [address]   Respiratory (~20 pathogens) panel by PCR     Status: Abnormal   Collection Time: 12/11/22 10:15 AM   Specimen: Nasopharyngeal Swab; Respiratory  Result Value Ref Range Status   Adenovirus NOT DETECTED NOT DETECTED Final   Coronavirus 229E NOT DETECTED NOT DETECTED Final    Comment: (NOTE) The Coronavirus on the Respiratory Panel, DOES NOT test for the novel  Coronavirus (2019 nCoV)    Coronavirus HKU1 DETECTED (A) NOT DETECTED Final   Coronavirus NL63 NOT DETECTED NOT DETECTED Final   Coronavirus OC43 NOT DETECTED NOT DETECTED Final   Metapneumovirus NOT DETECTED NOT DETECTED Final   Rhinovirus / Enterovirus NOT DETECTED NOT DETECTED Final   Influenza A NOT DETECTED NOT DETECTED Final   Influenza B NOT DETECTED NOT DETECTED Final   Parainfluenza Virus 1 NOT DETECTED NOT DETECTED Final   Parainfluenza Virus 2 NOT DETECTED NOT DETECTED Final   Parainfluenza Virus 3 NOT DETECTED NOT DETECTED Final   Parainfluenza Virus 4 NOT DETECTED NOT DETECTED Final   Respiratory Syncytial Virus NOT DETECTED NOT DETECTED Final   Bordetella pertussis NOT DETECTED NOT DETECTED Final   Bordetella Parapertussis NOT DETECTED NOT DETECTED Final   Chlamydophila pneumoniae NOT DETECTED NOT DETECTED Final   Mycoplasma pneumoniae NOT DETECTED NOT DETECTED Final    Comment: Performed at [facility], [address] [place], [place] [address]     Labs: BNP (last 3 results) No results for input(s): "BNP" in the last 8760 hours. Basic Metabolic Panel: Recent Labs  Lab 12/10/22 2044 12/11/22 0357 12/12/22 0431  NA 141 141 140  K 3.8 4.2 3.9  CL 105 107 106  CO2 _0 GLUCOSE 106* 126* 188*  BUN 14 16 28*  CREATININE 1.25* 1.22* 1.26*  CALCIUM 8.8* 8.4* 8.5*  MG  --  1.6* 2.2   Liver Function Tests: Recent Labs  Lab 12/10/22 2044 12/11/22 0357  AST 15 14*  ALT 17 16  ALKPHOS 54 48  BILITOT 0.9 1.0  PROT 6.9 6.3*  ALBUMIN 3.5 3.2*   No results for  input(s): "LIPASE", "AMYLASE" in the last 168 hours. No results for input(s): "AMMONIA" in the last 168 hours. CBC: Recent Labs  Lab 12/10/22 2044 12/11/22 0556 12/12/22 0431  WBC 23.3* 15.8* 15.9*  NEUTROABS 16.3* 14.5* 14.5*  HGB 11.5* 11.2* 10.3*  HCT 35.1* 33.9* 30.8*  MCV 90.2 92.1 90.1  PLT 165 144* 149*   Cardiac Enzymes: No results for input(s): "CKTOTAL", "CKMB", "CKMBINDEX", "TROPONINI" in the last 168 hours. BNP: Invalid input(s): "POCBNP" CBG: No results for input(s): "GLUCAP" in the last 168 hours. D-Dimer No results for input(s): "DDIMER" in the last 72 hours. Hgb A1c No results for input(s): "HGBA1C" in the last 72 hours. Lipid Profile No results for input(s): "CHOL", "HDL", "LDLCALC", "TRIG", "CHOLHDL", "LDLDIRECT" in the last 72 hours. Thyroid function studies No results for input(s): "TSH", "T4TOTAL", "T3FREE", "THYROIDAB" in the last 72 hours.  Invalid input(s): "FREET3" Anemia work up No results for input(s): "VITAMINB12", "FOLATE", "FERRITIN", "TIBC", "IRON", "RETICCTPCT" in the last 72 hours. Urinalysis    Component Value Date/Time   COLORURINE YELLOW 12/10/2022 [place] 12/10/2022 1515   APPEARANCEUR Cloudy (A) 10/26/2022 1121   LABSPEC 1.012 12/10/2022 1515   PHURINE 5.0 12/10/2022 1515   GLUCOSEU NEGATIVE 12/10/2022 1515   HGBUR NEGATIVE 12/10/2022 [place] 12/10/2022 1515   BILIRUBINUR Negative 10/26/2022 [place] 12/10/2022 1515   PROTEINUR NEGATIVE 12/10/2022 1515   NITRITE NEGATIVE 12/10/2022 1515   LEUKOCYTESUR NEGATIVE 12/10/2022 1515   Sepsis Labs Recent Labs  Lab 12/10/22 2044 12/11/22 0556 12/12/22  0431  WBC 23.3* 15.8* 15.9*   Microbiology Recent Results (from the past 240 hour(s))  Culture, blood (Routine x 2)     Status: None (Preliminary result)   Collection Time: 12/10/22  8:44 PM   Specimen: BLOOD RIGHT FOREARM  Result Value Ref Range Status   Specimen  Description BLOOD RIGHT FOREARM  Final   Special Requests   Final    BOTTLES DRAWN AEROBIC AND ANAEROBIC Blood Culture adequate volume   Culture   Final    NO GROWTH 2 DAYS Performed at [facility], [address] [place], [place] [address]    Report Status PENDING  Incomplete  Culture, blood (Routine x 2)     Status: None (Preliminary result)   Collection Time: 12/10/22  9:06 PM   Specimen: Site Not Specified; Blood  Result Value Ref Range Status   Specimen Description   Final    SITE NOT SPECIFIED BOTTLES DRAWN AEROBIC AND ANAEROBIC   Special Requests Blood Culture adequate volume  Final   Culture   Final    NO GROWTH 2 DAYS Performed at [facility], [address] [place], [place] [address]    Report Status PENDING  Incomplete  Resp panel by RT-PCR (RSV, Flu A&B, Covid) Anterior Nasal Swab     Status: None   Collection Time: 12/10/22  9:17 PM   Specimen: Anterior Nasal Swab  Result Value Ref Range Status   SARS Coronavirus 2 by RT PCR NEGATIVE NEGATIVE Final    Comment: (NOTE) SARS-CoV-2 target nucleic acids are NOT DETECTED.  The SARS-CoV-2 RNA is generally detectable in upper respiratory specimens during the acute phase of infection. The lowest concentration of SARS-CoV-2 viral copies this assay can detect is 138 copies/mL. A negative result does not preclude SARS-Cov-2 infection and should not be used as the sole basis for treatment or other patient management decisions. A negative result may occur with  improper specimen collection/handling, submission of specimen other than nasopharyngeal swab, presence of viral mutation(s) within the areas targeted by this assay, and inadequate number of viral copies(<138 copies/mL). A negative result must be combined with clinical observations, patient history, and epidemiological information. The expected result is Negative.  Fact Sheet for Patients:  EntrepreneurPulse.com.au  Fact Sheet for Healthcare  Providers:  IncredibleEmployment.be  This test is no t yet approved or cleared by the [place] FDA and  has been authorized for detection and/or diagnosis of SARS-CoV-2 by FDA under an Emergency Use Authorization (EUA). This EUA will remain  in effect (meaning this test can be used) for the duration of the COVID-19 declaration under Section 564(b)(1) of the Act, 21 U.S.C.section 360bbb-3(b)(1), unless the authorization is terminated  or revoked sooner.       Influenza A by PCR NEGATIVE NEGATIVE Final   Influenza B by PCR NEGATIVE NEGATIVE Final    Comment: (NOTE) The Xpert Xpress SARS-CoV-2/FLU/RSV plus assay is intended as an aid in the diagnosis of influenza from Nasopharyngeal swab specimens and should not be used as a sole basis for treatment. Nasal washings and aspirates are unacceptable for Xpert Xpress SARS-CoV-2/FLU/RSV testing.  Fact Sheet for Patients: EntrepreneurPulse.com.au  Fact Sheet for Healthcare Providers: IncredibleEmployment.be  This test is not yet approved or cleared by the [place] FDA and has been authorized for detection and/or diagnosis of SARS-CoV-2 by FDA under an Emergency Use Authorization (EUA). This EUA will remain in effect (meaning this test can be used) for the duration of the COVID-19 declaration under Section 564(b)(1)  of the Act, 21 U.S.C. section 360bbb-3(b)(1), unless the authorization is terminated or revoked.     Resp Syncytial Virus by PCR NEGATIVE NEGATIVE Final    Comment: (NOTE) Fact Sheet for Patients: EntrepreneurPulse.com.au  Fact Sheet for Healthcare Providers: IncredibleEmployment.be  This test is not yet approved or cleared by the [place] FDA and has been authorized for detection and/or diagnosis of SARS-CoV-2 by FDA under an Emergency Use Authorization (EUA). This EUA will remain in effect (meaning this test can be  used) for the duration of the COVID-19 declaration under Section 564(b)(1) of the Act, 21 U.S.C. section 360bbb-3(b)(1), unless the authorization is terminated or revoked.  Performed at [facility], [address] [place], [place] [address]   Respiratory (~20 pathogens) panel by PCR     Status: Abnormal   Collection Time: 12/11/22 10:15 AM   Specimen: Nasopharyngeal Swab; Respiratory  Result Value Ref Range Status   Adenovirus NOT DETECTED NOT DETECTED Final   Coronavirus 229E NOT DETECTED NOT DETECTED Final    Comment: (NOTE) The Coronavirus on the Respiratory Panel, DOES NOT test for the novel  Coronavirus (2019 nCoV)    Coronavirus HKU1 DETECTED (A) NOT DETECTED Final   Coronavirus NL63 NOT DETECTED NOT DETECTED Final   Coronavirus OC43 NOT DETECTED NOT DETECTED Final   Metapneumovirus NOT DETECTED NOT DETECTED Final   Rhinovirus / Enterovirus NOT DETECTED NOT DETECTED Final   Influenza A NOT DETECTED NOT DETECTED Final   Influenza B NOT DETECTED NOT DETECTED Final   Parainfluenza Virus 1 NOT DETECTED NOT DETECTED Final   Parainfluenza Virus 2 NOT DETECTED NOT DETECTED Final   Parainfluenza Virus 3 NOT DETECTED NOT DETECTED Final   Parainfluenza Virus 4 NOT DETECTED NOT DETECTED Final   Respiratory Syncytial Virus NOT DETECTED NOT DETECTED Final   Bordetella pertussis NOT DETECTED NOT DETECTED Final   Bordetella Parapertussis NOT DETECTED NOT DETECTED Final   Chlamydophila pneumoniae NOT DETECTED NOT DETECTED Final   Mycoplasma pneumoniae NOT DETECTED NOT DETECTED Final    Comment: Performed at [facility], [place]. [address] [place], [place] [address]    Procedures/Studies: DG CHEST PORT 1 VIEW  Result Date: 12/11/2022 CLINICAL DATA:  Shortness of breath.  Pneumonia 3 weeks ago. EXAM: PORTABLE CHEST 1 VIEW COMPARISON:  December 10, 2022 FINDINGS: Sternotomy wires are intact. No pneumothorax. No overt edema. Hazy opacity over the left mid lower lung is more pronounced  compared to previous imaging suggesting the possibility of effusion and/or opacity. No other acute abnormalities are identified. IMPRESSION: Increased haziness over the left mid and lower lung is concerning for effusion and/or opacity. No other acute abnormalities are identified. Electronically Signed   By: Dorise Bullion III M.D.   On: 12/11/2022 09:51   DG Chest Port 1 View  Result Date: 12/10/2022 CLINICAL DATA:  Sepsis EXAM: PORTABLE CHEST 1 VIEW COMPARISON:  10/30/2021 FINDINGS: Cardiac shadow is mildly prominent but stable. Lungs are well aerated bilaterally. No focal infiltrate or effusion is seen. Mild vascular congestion is again seen. No bony abnormality is noted. IMPRESSION: Mild vascular congestion without edema or effusion. Electronically Signed   By: Inez Catalina M.D.   On: 12/10/2022 21:19     Time coordinating discharge: Over 30 minutes    Dwyane Dee, MD  [facility] 12/12/2022, 5:06 PM

## 2022-12-12 NOTE — Progress Notes (Signed)
SATURATION QUALIFICATIONS: (This note is used to comply with regulatory documentation for home oxygen)  Patient Saturations on Room Air at Rest = 96%  Patient Saturations on Room Air while Ambulating = 94%  Patient Saturations on 0 Liters of oxygen while Ambulating = 94%  Please briefly explain why patient needs home oxygen: Pt did not desat enough to need oxygen.

## 2022-12-15 LAB — CULTURE, BLOOD (ROUTINE X 2)
Culture: NO GROWTH
Culture: NO GROWTH
Special Requests: ADEQUATE
Special Requests: ADEQUATE

## 2022-12-16 LAB — LEGIONELLA PNEUMOPHILA SEROGP 1 UR AG: L. pneumophila Serogp 1 Ur Ag: NEGATIVE

## 2022-12-20 ENCOUNTER — Other Ambulatory Visit: Payer: Self-pay | Admitting: Gynecologic Oncology

## 2022-12-20 DIAGNOSIS — R978 Other abnormal tumor markers: Secondary | ICD-10-CM

## 2022-12-20 DIAGNOSIS — N83209 Unspecified ovarian cyst, unspecified side: Secondary | ICD-10-CM

## 2023-01-25 ENCOUNTER — Other Ambulatory Visit: Payer: Self-pay | Admitting: *Deleted

## 2023-01-25 MED ORDER — FASENRA 30 MG/ML ~~LOC~~ SOSY: 30.0000 mg | PREFILLED_SYRINGE | SUBCUTANEOUS | 6 refills | Status: DC

## 2023-02-07 ENCOUNTER — Ambulatory Visit (INDEPENDENT_AMBULATORY_CARE_PROVIDER_SITE_OTHER): Payer: Medicare PPO

## 2023-02-07 DIAGNOSIS — J4550 Severe persistent asthma, uncomplicated: Secondary | ICD-10-CM

## 2023-02-17 ENCOUNTER — Other Ambulatory Visit: Payer: Self-pay | Admitting: Gynecologic Oncology

## 2023-02-17 DIAGNOSIS — R978 Other abnormal tumor markers: Secondary | ICD-10-CM

## 2023-02-17 DIAGNOSIS — N83209 Unspecified ovarian cyst, unspecified side: Secondary | ICD-10-CM

## 2023-03-09 ENCOUNTER — Ambulatory Visit: Payer: Medicare PPO | Admitting: Allergy & Immunology

## 2023-03-09 ENCOUNTER — Other Ambulatory Visit: Payer: Self-pay

## 2023-03-09 ENCOUNTER — Encounter: Payer: Self-pay | Admitting: Allergy & Immunology

## 2023-03-09 VITALS — BP 132/70 | HR 63 | Temp 98.3°F | Resp 16 | Ht 62.0 in | Wt 172.5 lb

## 2023-03-09 DIAGNOSIS — J3089 Other allergic rhinitis: Secondary | ICD-10-CM

## 2023-03-09 DIAGNOSIS — J4489 Other specified chronic obstructive pulmonary disease: Secondary | ICD-10-CM

## 2023-03-09 DIAGNOSIS — B999 Unspecified infectious disease: Secondary | ICD-10-CM

## 2023-03-09 DIAGNOSIS — J4550 Severe persistent asthma, uncomplicated: Secondary | ICD-10-CM

## 2023-03-09 NOTE — Patient Instructions (Addendum)
1. Asthma-COPD overlap syndrome - Lung testing looks stable today which I think it all we can hope for now.  - We are not going to make any medication changes today.  - We will send information for a home spirometry device called Aluna.  - Daily controller medication(s): Fasenra 30mg  every 8 weeks and Breztri two puffs twice daily with spacer - Prior to physical activity: albuterol 2 puffs 10-15 minutes before physical activity. - Rescue medications: albuterol 4 puffs every 4-6 hours as needed - Asthma control goals:  * Full participation in all desired activities (may need albuterol before activity) * Albuterol use two time or less a week on average (not counting use with activity) * Cough interfering with sleep two time or less a month * Oral steroids no more than once a year * No hospitalizations  2. Perennial allergic rhinitis - Continue with Allegra one tablet daily (you can INCREASE to twice daily)  - Continue with Flonase one spray per nostril daily.  - Continue with nasal saline rinses as needed.  3. Return in about 6 months (around 09/09/2023) or earlier if needed.    Please inform us of any Emergency Department visits, hospitalizations, or changes in symptoms. Call us before going to the ED for breathing or allergy symptoms since we might be able to fit you in for a sick visit. Feel free to contact us anytime with any questions, problems, or concerns.  It was a pleasure to see you again today!  Websites that have reliable patient information: 1. American Academy of Asthma, Allergy, and Immunology: www.aaaai.org 2. Food Allergy Research and Education (FARE): foodallergy.org 3. Mothers of Asthmatics: http://www.asthmacommunitynetwork.org 4. American College of Allergy, Asthma, and Immunology: www.acaai.org   COVID-19 Vaccine Information can be found at: ShippingScam.co.uk For questions related to vaccine distribution  or appointments, please email vaccine@[email or web address] .[email or web address] or call [phone].   We realize that you might be concerned about having an allergic reaction to the COVID19 vaccines. To help with that concern, WE ARE OFFERING THE COVID19 VACCINES IN OUR OFFICE! Ask the front desk for dates!     "Like" [place] on Facebook and Instagram for our latest updates!      A healthy democracy works best when [employer] participate! Make sure you are registered to vote! If you have moved or changed any of your contact information, you will need to get this updated before voting!  In some cases, you MAY be able to register to vote online: [email or web address]

## 2023-03-09 NOTE — Progress Notes (Signed)
FOLLOW UP  Date of Service/Encounter:  03/09/23   Assessment:   Asthma-COPD overlap syndrome - with severe stable restriction improved on Fasenra (followed by Dr. 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 Asthma-COPD overlap syndrome - Lung testing looks stable today which I think it all we can hope for now.  - We are not going to make any medication changes today.  - We will send information for a home spirometry device called Aluna.  - Daily controller medication(s): Fasenra 30mg  every 8 weeks and Breztri two puffs twice daily with spacer - Prior to physical activity: albuterol 2 puffs 10-15 minutes before physical activity. - Rescue medications: albuterol 4 puffs every 4-6 hours as needed - Asthma control goals:  * Full participation in all desired activities (may need albuterol before activity) * Albuterol use two time or less a week on average (not counting use with activity) * Cough interfering with sleep two time or less a month * Oral steroids no more than once a year * No hospitalizations  2. Perennial allergic rhinitis - Continue with Allegra one tablet daily (you can INCREASE to twice daily)  - Continue with Flonase one spray per nostril daily.  - Continue with nasal saline rinses as needed.  3. Return in about 6 months (around 09/09/2023) or earlier if needed.   Subjective:   Debra [name] is a 77 y.o. female presenting today for follow up of  Chief Complaint  Patient presents with   Follow-up    Debra [name] has a history of the following: Patient Active Problem List   Diagnosis Date Noted   COPD with acute exacerbation ([place]) 12/11/2022   Depression 12/11/2022   Chronic kidney disease, stage 3b ([place]) 12/11/2022   Sepsis ([place]) 12/11/2022   CAP (community  acquired pneumonia) 12/10/2022   Pelvic mass 07/27/2022   Ovarian cyst 03/08/2022   Essential hypertension 06/11/2021   Asthma-COPD overlap syndrome 09/28/2016   Perennial allergic rhinitis 09/28/2016    History obtained from: chart review and patient.  Debra [name] is a 77 y.o. female presenting for a follow up visit.  She was last seen in September 2023.  At that time, her lung testing looks stable and actually looked a little bit better.  We continued with Berna Bue every 8 weeks as well as Breztri 2 puffs twice daily.  For her allergic rhinitis, she continue with Allegra up to twice daily as well as Flonase.  In the interim, she was admitted to the hospital right before Christmas with a COPD exacerbation. She was actually diagnosed with norovirus. Her oxygen was a bit lower than it should have been.   Since last visit, she reports that she has done well.   Asthma/Respiratory Symptom History: She does have an intermittent coughing and throat clearing. Otherwise she is good.  Debra [name] is doing well. She is doing two puffs twice daily. This seems to be controlling her symptoms in combination with her Berna Bue. She has not been using her rescue inhaler much at all. She continues to do a lot of her yard work which she enjoys.   Allergic Rhinitis Symptom History: She remains on the Allegra once daily as well as the fluticasone. She has not been on antibiotics in quite some time for her breathing. She is overall doing well with  the current regimen.   She did pull something in her shoulder around one week ago. She was lifting something that was likely too heavy for her. She is stubborn and likes to just do things herself, so she never asks for help.   Otherwise, there have been no changes to her past medical history, surgical history, family history, or social history.    Review of Systems  Constitutional: Negative.  Negative for chills, fever, malaise/fatigue and weight loss.  HENT: Negative.  Negative  for congestion, ear discharge, ear pain, sinus pain and sore throat.        Positive for rhinorrhea with postnasal drip. Positive for throat clearing.   Eyes:  Negative for pain, discharge and redness.  Respiratory:  Positive for cough. Negative for sputum production, shortness of breath and wheezing.   Cardiovascular: Negative.  Negative for chest pain and palpitations.  Gastrointestinal:  Negative for abdominal pain, constipation, diarrhea, heartburn, nausea and vomiting.  Skin: Negative.  Negative for itching and rash.  Neurological:  Negative for dizziness and headaches.  Endo/Heme/Allergies:  Positive for environmental allergies. Does not bruise/bleed easily.       Objective:   Blood pressure 132/70, pulse 63, temperature 98.3 F (36.8 C), resp. rate 16, height 5\' 2"  (1.575 m), weight 172 lb 8 oz (78.2 kg), SpO2 97 %. Body mass index is 31.55 kg/m.    Physical Exam Vitals reviewed.  Constitutional:      Appearance: She is well-developed.     Comments: Pleasant female.  Very talkative as always.  Full of energy.  HENT:     Head: Normocephalic and atraumatic.     Right Ear: Tympanic membrane, ear canal and external ear normal.     Left Ear: Tympanic membrane, ear canal and external ear normal.     Nose: No nasal deformity, septal deviation, mucosal edema or rhinorrhea.     Right Turbinates: Enlarged, swollen and pale.     Left Turbinates: Enlarged, swollen and pale.     Right Sinus: No maxillary sinus tenderness or frontal sinus tenderness.     Left Sinus: No maxillary sinus tenderness or frontal sinus tenderness.     Comments: No nasal polyps noted.     Mouth/Throat:     Mouth: Mucous membranes are not pale and not dry.     Pharynx: Uvula midline.  Eyes:     General: Lids are normal. No allergic shiner.       Right eye: No discharge.        Left eye: No discharge.     Conjunctiva/sclera: Conjunctivae normal.     Right eye: Right conjunctiva is not injected. No  chemosis.    Left eye: Left conjunctiva is not injected. No chemosis.    Pupils: Pupils are equal, round, and reactive to light.  Cardiovascular:     Rate and Rhythm: Normal rate and regular rhythm.     Heart sounds: Normal heart sounds.  Pulmonary:     Effort: Pulmonary effort is normal. No tachypnea, accessory muscle usage or respiratory distress.     Breath sounds: Normal breath sounds. No wheezing, rhonchi or rales.     Comments: Moving air well in all lung fields. No increased work of breathing noted.  Chest:     Chest wall: No tenderness.  Lymphadenopathy:     Cervical: No cervical adenopathy.  Skin:    General: Skin is warm.     Capillary Refill: Capillary refill takes less than 2 seconds.  Coloration: Skin is not pale.     Findings: No abrasion, erythema, petechiae or rash. Rash is not papular, urticarial or vesicular.  Neurological:     Mental Status: She is alert.  Psychiatric:        Behavior: Behavior is cooperative.      Diagnostic studies:    Spirometry: results abnormal (FEV1: 0.71/43%, FVC: 0.88/41%, FEV1/FVC: 81%).    Spirometry consistent with mixed obstructive and restrictive disease.  Overall, this appears stable compared to previous visit   Allergy Studies: none        Salvatore Marvel, MD  [facility]

## 2023-03-11 ENCOUNTER — Inpatient Hospital Stay: Payer: Medicare PPO

## 2023-03-11 ENCOUNTER — Inpatient Hospital Stay: Payer: Medicare PPO | Attending: Gynecologic Oncology | Admitting: Gynecologic Oncology

## 2023-03-11 ENCOUNTER — Encounter: Payer: Self-pay | Admitting: Gynecologic Oncology

## 2023-03-11 ENCOUNTER — Other Ambulatory Visit: Payer: Self-pay

## 2023-03-11 VITALS — BP 135/55 | HR 59 | Temp 98.1°F | Wt 174.4 lb

## 2023-03-11 DIAGNOSIS — R971 Elevated cancer antigen 125 [CA 125]: Secondary | ICD-10-CM | POA: Insufficient documentation

## 2023-03-11 DIAGNOSIS — R978 Other abnormal tumor markers: Secondary | ICD-10-CM | POA: Diagnosis not present

## 2023-03-11 DIAGNOSIS — D3912 Neoplasm of uncertain behavior of left ovary: Secondary | ICD-10-CM | POA: Insufficient documentation

## 2023-03-11 DIAGNOSIS — Z90722 Acquired absence of ovaries, bilateral: Secondary | ICD-10-CM | POA: Insufficient documentation

## 2023-03-11 DIAGNOSIS — R109 Unspecified abdominal pain: Secondary | ICD-10-CM | POA: Insufficient documentation

## 2023-03-11 DIAGNOSIS — R140 Abdominal distension (gaseous): Secondary | ICD-10-CM

## 2023-03-11 DIAGNOSIS — Z90710 Acquired absence of both cervix and uterus: Secondary | ICD-10-CM | POA: Diagnosis not present

## 2023-03-11 DIAGNOSIS — B3731 Acute candidiasis of vulva and vagina: Secondary | ICD-10-CM

## 2023-03-11 DIAGNOSIS — B379 Candidiasis, unspecified: Secondary | ICD-10-CM

## 2023-03-11 MED ORDER — FLUCONAZOLE 150 MG PO TABS: 150.0000 mg | ORAL_TABLET | Freq: Every day | ORAL | 1 refills | Status: DC

## 2023-03-11 NOTE — Patient Instructions (Addendum)
It was good to see you today.  I do not see or feel any evidence of cancer recurrence on your exam.  I will release your blood test to you when they are back.  Because of your bloating symptoms and recent pain, I have ordered a CT scan.  I will contact you with these results.  Please remember to keep a food diary and if you are able to identify foods that cause bloating, please try to avoid these foods.  If bloating improves between now and when you are scheduled for the CT scan, please call my clinic so that we can cancel this.   Please call Dr. Annie Main office to schedule a follow-up in 6 months.  Please call my office after the new year to schedule a visit to see me in 12 months.   As always, if you develop any new and concerning symptoms before your next visit, please call to see me sooner.

## 2023-03-11 NOTE — Progress Notes (Signed)
Gynecologic Oncology Return Clinic Visit  03/11/23  Reason for Visit: surveillance  Treatment History: 06/17/22: CA-125 34.8, HE4 158. CEA 2.5. 07/21/22: CT A/P shows 4.6 cm complex cystic mass in right ovary. No lymphadenopathy or ascites. No metastatic disease.  07/27/22: Robotic BSO, LOA, staging including peritoneal biopsies and omental biopsy, cystoscopy  Interval History: Doing well.  Notes more recently developing abdominal bloating, thinks it may be related to diet but is unsure if certain foods cause this.  Had some upper abdominal pain yesterday and in the evening, thinks this may have been related to abdominal palpation after seeing her primary.  She denies any low pelvic pain.  Reports normal bowel function.  She denies any vaginal bleeding or discharge.  Reports some weight gain although her weight is stable from her visit with me in August.  She was recently admitted in December for community-acquired pneumonia with COPD acute exacerbation.  Past Medical/Surgical History: Past Medical History:  Diagnosis Date   Allergic rhinitis    Arthritis    Asthma    Chronic kidney disease (CKD)    COPD (chronic obstructive pulmonary disease) ([place])    Diabetes mellitus without complication ([place])    Dyspnea    Hyperlipidemia    Hypertension    Hypertensive chronic kidney disease    Recurrent upper respiratory infection (URI)    Secondary hyperparathyroidism of renal origin ([place])     Past Surgical History:  Procedure Laterality Date   CHOLECYSTECTOMY     CORONARY ARTERY BYPASS GRAFT     PARTIAL HYSTERECTOMY  1981   ovaries left in situ, was done for fibroids   ROBOTIC ASSISTED BILATERAL SALPINGO OOPHERECTOMY Bilateral 07/27/2022   Procedure: XI ROBOTIC [place],;  Surgeon: Lafonda Mosses, MD;  Location: [facility];  Service: Gynecology;  Laterality: Bilateral;    Family History  Problem Relation Age of Onset   Stroke Mother    Cancer  Father    Prostate cancer Father    Alcohol abuse Sister    Heart attack Brother    Alcohol abuse Brother    Other Brother        MVA   Asthma Daughter    Miscarriages / [place] Daughter    Diabetes Son    Allergic rhinitis Neg Hx    Eczema Neg Hx    Immunodeficiency Neg Hx    Urticaria Neg Hx    Angioedema Neg Hx    Atopy Neg Hx     Social History   Socioeconomic History   Marital status: Married    Spouse name: Not on file   Number of children: Not on file   Years of education: Not on file   Highest education level: Not on file  Occupational History   Not on file  Tobacco Use   Smoking status: Former    Types: Cigarettes    Quit date: 05/19/2012    Years since quitting: 10.8   Smokeless tobacco: Never  Vaping Use   Vaping Use: Never used  Substance and Sexual Activity   Alcohol use: No    Alcohol/week: 0.0 standard drinks of alcohol   Drug use: No   Sexual activity: Not Currently    Birth control/protection: Surgical    Comment: partial hyst  Other Topics Concern   Not on file  Social History Narrative   Not on file   Social Determinants of Health   Financial Resource Strain: Low Risk  (04/08/2022)   Overall Emergency planning/management officer Strain (  CARDIA)    Difficulty of Paying Living Expenses: Not very hard  Food Insecurity: No Food Insecurity (12/11/2022)   Hunger Vital Sign    Worried About Running Out of Food in the Last Year: Never true    Ran Out of Food in the Last Year: Never true  Transportation Needs: No Transportation Needs (12/11/2022)   PRAPARE - Hydrologist (Medical): No    Lack of Transportation (Non-Medical): No  Physical Activity: Inactive (04/08/2022)   Exercise Vital Sign    Days of Exercise per Week: 0 days    Minutes of Exercise per Session: 0 min  Stress: No Stress Concern Present (04/08/2022)   [place]    Feeling of Stress : Not at all   Social Connections: Moderately Isolated (04/08/2022)   Social Connection and Isolation Panel [NHANES]    Frequency of Communication with Friends and Family: More than three times a week    Frequency of Social Gatherings with Friends and Family: Three times a week    Attends Religious Services: Never    Active Member of Clubs or Organizations: No    Attends Archivist Meetings: Never    Marital Status: Married   Review of Systems: + Weight change, shortness of breath, wheezing, cough, swelling of the legs, bloating, hot flashes, back pain, joint pain. Denies appetite changes, fevers, chills, fatigue. Denies hearing loss, neck lumps or masses, mouth sores, ringing in ears or voice changes. Denies chest pain or palpitations.  Denies abdominal pain, blood in stools, constipation, diarrhea, nausea, vomiting, or early satiety. Denies pain with intercourse, dysuria, frequency, hematuria or incontinence. Denies pelvic pain, vaginal bleeding or vaginal discharge.   Denies muscle pain/cramps. Denies itching, rash, or wounds. Denies dizziness, headaches, numbness or seizures. Denies swollen lymph nodes or glands, denies easy bruising or bleeding. Denies anxiety, depression, confusion, or decreased concentration.  Physical Exam: BP (!) 135/55 (BP Location: Left Arm, Patient Position: Sitting)   Pulse (!) 59   Temp 98.1 F (36.7 C) (Oral)   Wt 174 lb 6.4 oz (79.1 kg)   SpO2 93%   BMI 31.90 kg/m  General: Alert, oriented, no acute distress. HEENT: Normocephalic, atraumatic, sclera anicteric. Chest: Mild expiratory wheezes, otherwise lungs clear to auscultation bilaterally. Cardiovascular: Regular rate and rhythm, no murmurs. Abdomen: Obese, soft, nontender.  Moderate upper abdominal distention with mild tympany.  Normoactive bowel sounds.  No masses or hepatosplenomegaly appreciated.  Well healed incisions. Extremities: Grossly normal range of motion.  Warm, well perfused.  No edema  bilaterally. Skin: No rashes or lesions noted. Lymphatics: No cervical, supraclavicular, or inguinal adenopathy. GU: Normal appearing external genitalia without erythema, excoriation, or lesions.  Speculum exam reveals cuff intact, mildly atrophic vagina.  Bimanual exam reveals no masses or nodularity appreciated.  Rectovaginal exam confirms exam findings.  Laboratory & Radiologic Studies: None new  Assessment & Plan: Debra [name] is a 77 y.o. 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  Patient is overall doing well.  Although she reports some unexplained weight changes, we discussed that her weight is stable from her visit with me 6 months ago.    Given new onset abdominal bloating as well as some recent pain, discussed getting imaging to assure no recurrence of borderline tumor.  I suspect that bloating may be related to diet.  I have asked the patient to keep a food diary to see  if she can identify foods that are causing her bloating.  If she was able to identify these foods and eliminate them with improvement of her symptoms, I have asked her to cancel that her upcoming scheduled CT scan.  Otherwise, we will seed with CT scan of the abdomen and pelvis.   Tumor markers were ordered today including CA-125 and HE4, which was elevated at time of diagnosis.  I will contact the patient with both her CT results as well as tumor markers.  She has been having some vulvar and vaginal itching, has had some relief with Monistat but continues to have symptoms.  Prescription sent for one-time dose of Diflucan with a refill.  We have previously discussed no true guidelines for surveillance in the setting.  We have previously discussed alternating visits between my office and her OB/GYN.  My recommendation is that we alternate these visits every 6 months.  I have encouraged her to reach out to her OB/GYN for follow-up in the fall and to call my clinic to  schedule follow-up this time next year.  We reviewed signs and symptoms that would be concerning for recurrence of her borderline tumor.  22 minutes of total time was spent for this patient encounter, including preparation, face-to-face counseling with the patient and coordination of care, and documentation of the encounter.  Jeral Pinch, MD  Division of Gynecologic Oncology  Department of Obstetrics and Gynecology  [facility]

## 2023-03-12 LAB — CA 125: Cancer Antigen (CA) 125: 18.8 U/mL (ref 0.0–38.1)

## 2023-03-17 ENCOUNTER — Other Ambulatory Visit: Payer: Self-pay | Admitting: Allergy & Immunology

## 2023-03-17 ENCOUNTER — Other Ambulatory Visit: Payer: Self-pay | Admitting: Gynecologic Oncology

## 2023-03-17 DIAGNOSIS — R978 Other abnormal tumor markers: Secondary | ICD-10-CM

## 2023-03-17 DIAGNOSIS — N83209 Unspecified ovarian cyst, unspecified side: Secondary | ICD-10-CM

## 2023-03-17 NOTE — Telephone Encounter (Signed)
Is it ok to refill this medication? There was no mention of it in your last office note.

## 2023-03-18 ENCOUNTER — Telehealth: Payer: Self-pay

## 2023-03-18 NOTE — Telephone Encounter (Signed)
Pt called office to let Dr. Berline Lopes know she wants to cancel the CT scan scheduled for 4/4 at [facility]. She states the reason she has been having bloating is due to her diet. She sits at the table and eats and will try to do better. She has also been on Prednisone. She will do better with diet and if she thinks she needs the CT she will call back.  Dr.Tucker notified

## 2023-03-24 ENCOUNTER — Ambulatory Visit (HOSPITAL_COMMUNITY): Payer: Medicare PPO

## 2023-04-04 ENCOUNTER — Ambulatory Visit (INDEPENDENT_AMBULATORY_CARE_PROVIDER_SITE_OTHER): Payer: Medicare PPO

## 2023-04-04 DIAGNOSIS — J4550 Severe persistent asthma, uncomplicated: Secondary | ICD-10-CM

## 2023-04-12 ENCOUNTER — Other Ambulatory Visit: Payer: Self-pay | Admitting: Gynecologic Oncology

## 2023-04-12 DIAGNOSIS — N83209 Unspecified ovarian cyst, unspecified side: Secondary | ICD-10-CM

## 2023-04-12 DIAGNOSIS — R978 Other abnormal tumor markers: Secondary | ICD-10-CM

## 2023-04-18 ENCOUNTER — Other Ambulatory Visit: Payer: Self-pay | Admitting: Allergy & Immunology

## 2023-04-18 MED ORDER — FEXOFENADINE HCL 180 MG PO TABS: 180.0000 mg | ORAL_TABLET | Freq: Two times a day (BID) | ORAL | 1 refills | Status: DC | PRN

## 2023-04-18 NOTE — Addendum Note (Signed)
Addended by: Dub Mikes on: 04/18/2023 02:04 PM   Modules accepted: Orders

## 2023-04-20 ENCOUNTER — Ambulatory Visit: Payer: Medicare PPO | Admitting: Obstetrics & Gynecology

## 2023-04-20 ENCOUNTER — Other Ambulatory Visit (HOSPITAL_COMMUNITY)
Admission: RE | Admit: 2023-04-20 | Discharge: 2023-04-20 | Disposition: A | Discharge status: Home / Self Care | Payer: Medicare PPO | Source: Ambulatory Visit | Attending: Obstetrics & Gynecology | Admitting: Obstetrics & Gynecology

## 2023-04-20 ENCOUNTER — Encounter: Payer: Self-pay | Admitting: Obstetrics & Gynecology

## 2023-04-20 VITALS — BP 146/65 | HR 55 | Ht 62.0 in | Wt 170.2 lb

## 2023-04-20 DIAGNOSIS — N898 Other specified noninflammatory disorders of vagina: Secondary | ICD-10-CM | POA: Diagnosis present

## 2023-04-20 DIAGNOSIS — N8110 Cystocele, unspecified: Secondary | ICD-10-CM

## 2023-04-20 DIAGNOSIS — L309 Dermatitis, unspecified: Secondary | ICD-10-CM

## 2023-04-20 DIAGNOSIS — Z4689 Encounter for fitting and adjustment of other specified devices: Secondary | ICD-10-CM

## 2023-04-20 MED ORDER — TRIAMCINOLONE ACETONIDE 0.5 % EX OINT: TOPICAL_OINTMENT | CUTANEOUS | 1 refills | Status: DC

## 2023-04-20 NOTE — Progress Notes (Signed)
   GYN VISIT Patient name: Debra [name] MRN [id number]  Date of birth: 1946/09/03 Chief Complaint:   Follow-up (Having problems with bladder dropping, vaginal irritation)  History of Present Illness:   Debra [name] is a 77 y.o. [id number] PM, PH female being seen today for the following concerns:  -Vaginal burning: Most notably at night- feels internal discomfort.  On occasion itching externally.  No discharge, slight odor.  Denies pelvic pain.    Up at night to void x 1.  Denies urinary urgency.  Denies incontinence.  No LMP recorded. Patient has had a hysterectomy.     04/08/2022    9:25 AM 08/06/2021   10:59 AM 11/29/2014   10:52 AM  Depression screen PHQ 2/9  Decreased Interest 0 0 0  Down, Depressed, Hopeless 0 0 0  PHQ - 2 Score 0 0 0  Altered sleeping 0 0   Tired, decreased energy 1 1   Change in appetite 1 3   Feeling bad or failure about yourself  0 0   Trouble concentrating 0 0   Moving slowly or fidgety/restless 0 0   Suicidal thoughts 0 0   PHQ-9 Score 2 4      Review of Systems:   Pertinent items are noted in HPI Denies fever/chills, dizziness, headaches, visual disturbances, fatigue, shortness of breath, chest pain, abdominal pain, vomiting, no problems with bowel movements, urination, or intercourse unless otherwise stated above.  Pertinent History Reviewed:  Reviewed past medical,surgical, social, obstetrical and family history.  Reviewed problem list, medications and allergies. Physical Assessment:   Vitals:   04/20/23 0940 04/20/23 1015  BP: (!) 141/69 (!) 146/65  Pulse: (!) 56 (!) 55  Weight: 170 lb 3.2 oz (77.2 kg)   Height: 5\' 2"  (1.575 m)   Body mass index is 31.13 kg/m.       Physical Examination:   General appearance: alert, well appearing, and in no distress  Psych: mood appropriate, normal affect  Skin: warm & dry   Cardiovascular: normal heart rate noted  Respiratory: normal respiratory effort, no distress  Abdomen: soft, non-tender    Pelvic: VULVA: pink spot-like discoloration of labia minora and perineum, VAGINA: normal appearing vagina with normal color and discharge, no lesions, uterus and cervix surgically absent.  With cough stage I cystocele noted.  No rectocele  Extremities: no edema   Chaperone: Faith Rogue    Assessment & Plan:  1) stage I cystocele -Reviewed findings on exam -Patient fitted for pessary Milex ring #2 placed -Plan to follow-up in 3 months []  May need to consider vaginal estrogen cream  2) vulvar/vaginal irritation -Plan to rule out underlying infection, vaginitis panel obtained -Low-dose steroid ointment sent in, reviewed sparing use with patient -Follow-up in 3 months    Meds ordered this encounter  Medications   triamcinolone ointment (KENALOG) 0.5 %    Sig: Apply pea-sized amount externally to affected area as needed    Dispense:  30 g    Refill:  1      Return in about 3 months (around 07/21/2023) for pessary follow up (Katelee Schupp or Eure).   Myna Hidalgo, DO Attending Obstetrician & Gynecologist, [facility] for [employer], [facility]

## 2023-04-20 NOTE — Addendum Note (Signed)
Addended by: Annamarie Dawley on: 04/20/2023 12:32 PM   Modules accepted: Orders

## 2023-04-21 ENCOUNTER — Other Ambulatory Visit: Payer: Self-pay | Admitting: Obstetrics & Gynecology

## 2023-04-21 DIAGNOSIS — N760 Acute vaginitis: Secondary | ICD-10-CM

## 2023-04-21 LAB — CERVICOVAGINAL ANCILLARY ONLY
Bacterial Vaginitis (gardnerella): POSITIVE — AB
Candida Glabrata: NEGATIVE
Candida Vaginitis: NEGATIVE
Comment: NEGATIVE
Comment: NEGATIVE
Comment: NEGATIVE

## 2023-04-21 MED ORDER — METRONIDAZOLE 500 MG PO TABS: 500.0000 mg | ORAL_TABLET | Freq: Two times a day (BID) | ORAL | 0 refills | Status: AC

## 2023-04-21 NOTE — Progress Notes (Signed)
Rx for oral metronidazole  Metrogel was $50  Myna Hidalgo, DO Attending Obstetrician & Gynecologist, [facility] for [employer], [facility]

## 2023-04-28 ENCOUNTER — Other Ambulatory Visit: Payer: Self-pay | Admitting: Gynecologic Oncology

## 2023-04-28 DIAGNOSIS — N83209 Unspecified ovarian cyst, unspecified side: Secondary | ICD-10-CM

## 2023-04-28 DIAGNOSIS — R978 Other abnormal tumor markers: Secondary | ICD-10-CM

## 2023-05-30 ENCOUNTER — Ambulatory Visit (INDEPENDENT_AMBULATORY_CARE_PROVIDER_SITE_OTHER): Payer: Medicare PPO

## 2023-05-30 DIAGNOSIS — J4550 Severe persistent asthma, uncomplicated: Secondary | ICD-10-CM

## 2023-07-20 ENCOUNTER — Ambulatory Visit: Payer: Medicare PPO | Admitting: Obstetrics & Gynecology

## 2023-07-25 ENCOUNTER — Ambulatory Visit (INDEPENDENT_AMBULATORY_CARE_PROVIDER_SITE_OTHER): Payer: Medicare PPO

## 2023-07-25 DIAGNOSIS — J4550 Severe persistent asthma, uncomplicated: Secondary | ICD-10-CM

## 2023-07-27 ENCOUNTER — Ambulatory Visit: Payer: Medicare PPO | Admitting: Obstetrics & Gynecology

## 2023-08-02 ENCOUNTER — Encounter: Payer: Self-pay | Admitting: Obstetrics & Gynecology

## 2023-08-02 ENCOUNTER — Ambulatory Visit: Payer: Medicare PPO | Admitting: Obstetrics & Gynecology

## 2023-08-02 ENCOUNTER — Other Ambulatory Visit (HOSPITAL_COMMUNITY)
Admission: RE | Admit: 2023-08-02 | Discharge: 2023-08-02 | Disposition: A | Discharge status: Home / Self Care | Payer: Medicare PPO | Source: Ambulatory Visit | Attending: Obstetrics & Gynecology | Admitting: Obstetrics & Gynecology

## 2023-08-02 VITALS — BP 139/75 | HR 84 | Ht 62.0 in | Wt 167.6 lb

## 2023-08-02 DIAGNOSIS — N898 Other specified noninflammatory disorders of vagina: Secondary | ICD-10-CM | POA: Diagnosis present

## 2023-08-02 DIAGNOSIS — N8110 Cystocele, unspecified: Secondary | ICD-10-CM | POA: Diagnosis not present

## 2023-08-02 DIAGNOSIS — L309 Dermatitis, unspecified: Secondary | ICD-10-CM | POA: Diagnosis not present

## 2023-08-02 NOTE — Progress Notes (Signed)
     GYN VISIT Patient name: Debra [name] MRN [id number]  Date of birth: 09/09/1946 Chief Complaint:    Chief Complaint  Patient presents with   Pessary Check   History of Present Illness:   Debra [name] is a 77 y.o. [id number] PM, PH female being seen today for the following concerns:  POP: At her last visit she was fitted for a Milex ring #2.  Today she notes that the device actually fell out about a month ago and did not bring it with her.  She does note that at that time she was having issues with persistent coughing, which is when the device fell out.  Prior to that she feels as though the device was working well to help with her prolapse.  Vaginal irritation: At her last visit she was treated for BV infection.  She does note improvement of the irritation and odor. 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  Pertinent History Reviewed:  Reviewed past medical,surgical, social, obstetrical and family history.  Reviewed problem list, medications and allergies. Physical Assessment:   Vitals:   08/02/23 0955  BP: 139/75  Pulse: 84  Weight: 167 lb 9.6 oz (76 kg)  Height: 5\' 2"  (1.575 m)  Body mass index is 30.65 kg/m.       Physical Examination:   General appearance: alert, well appearing, and in no distress  Psych: mood appropriate, normal affect  Skin: warm & dry   Cardiovascular: normal heart rate noted  Respiratory: normal respiratory effort, no distress  Abdomen: soft, non-tender   GU: VULVA: pink spot-like discoloration of labia minora and perineum, VAGINA: normal appearing vagina with normal color and discharge, no lesions, uterus and cervix surgically absent.  With cough stage I cystocele noted.  No rectocele     Extremities: no edema   Chaperone:  Dr. Wyn Forster     Assessment & Plan:     ICD-10-CM   1. 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   Myna Hidalgo, DO Attending Obstetrician & Gynecologist, [facility] for [employer], [facility]

## 2023-08-22 ENCOUNTER — Emergency Department (HOSPITAL_COMMUNITY): Payer: Medicare PPO

## 2023-08-22 ENCOUNTER — Emergency Department (HOSPITAL_COMMUNITY)
Admission: EM | Admit: 2023-08-22 | Discharge: 2023-08-22 | Disposition: A | Discharge status: Home / Self Care | Payer: Medicare PPO | Attending: Emergency Medicine | Admitting: Emergency Medicine

## 2023-08-22 ENCOUNTER — Encounter (HOSPITAL_COMMUNITY): Payer: Self-pay | Admitting: Emergency Medicine

## 2023-08-22 ENCOUNTER — Other Ambulatory Visit: Payer: Self-pay

## 2023-08-22 DIAGNOSIS — Z20822 Contact with and (suspected) exposure to covid-19: Secondary | ICD-10-CM | POA: Insufficient documentation

## 2023-08-22 DIAGNOSIS — Z79899 Other long term (current) drug therapy: Secondary | ICD-10-CM | POA: Diagnosis not present

## 2023-08-22 DIAGNOSIS — J441 Chronic obstructive pulmonary disease with (acute) exacerbation: Secondary | ICD-10-CM | POA: Insufficient documentation

## 2023-08-22 DIAGNOSIS — R0602 Shortness of breath: Secondary | ICD-10-CM | POA: Diagnosis present

## 2023-08-22 DIAGNOSIS — Z7982 Long term (current) use of aspirin: Secondary | ICD-10-CM | POA: Diagnosis not present

## 2023-08-22 LAB — COMPREHENSIVE METABOLIC PANEL
ALT: 13 U/L (ref 0–44)
AST: 19 U/L (ref 15–41)
Albumin: 3.4 g/dL — ABNORMAL LOW (ref 3.5–5.0)
Alkaline Phosphatase: 63 U/L (ref 38–126)
Anion gap: 5 (ref 5–15)
BUN: 27 mg/dL — ABNORMAL HIGH (ref 8–23)
CO2: 32 mmol/L (ref 22–32)
Calcium: 8.9 mg/dL (ref 8.9–10.3)
Chloride: 99 mmol/L (ref 98–111)
Creatinine, Ser: 1.69 mg/dL — ABNORMAL HIGH (ref 0.44–1.00)
GFR, Estimated: 31 mL/min — ABNORMAL LOW (ref >60)
Glucose, Bld: 111 mg/dL — ABNORMAL HIGH (ref 70–99)
Potassium: 3.8 mmol/L (ref 3.5–5.1)
Sodium: 136 mmol/L (ref 135–145)
Total Bilirubin: 0.9 mg/dL (ref 0.3–1.2)
Total Protein: 7.3 g/dL (ref 6.5–8.1)

## 2023-08-22 LAB — CBC WITH DIFFERENTIAL/PLATELET
Abs Immature Granulocytes: 0.07 10*3/uL (ref 0.00–0.07)
Basophils Absolute: 0 10*3/uL (ref 0.0–0.1)
Basophils Relative: 0 %
Eosinophils Absolute: 0.1 10*3/uL (ref 0.0–0.5)
Eosinophils Relative: 1 %
HCT: 32.7 % — ABNORMAL LOW (ref 36.0–46.0)
Hemoglobin: 10.6 g/dL — ABNORMAL LOW (ref 12.0–15.0)
Immature Granulocytes: 0 %
Lymphocytes Relative: 15 %
Lymphs Abs: 2.5 10*3/uL (ref 0.7–4.0)
MCH: 29.6 pg (ref 26.0–34.0)
MCHC: 32.4 g/dL (ref 30.0–36.0)
MCV: 91.3 fL (ref 80.0–100.0)
Monocytes Absolute: 1 10*3/uL (ref 0.1–1.0)
Monocytes Relative: 6 %
Neutro Abs: 12.9 10*3/uL — ABNORMAL HIGH (ref 1.7–7.7)
Neutrophils Relative %: 78 %
Platelets: 205 10*3/uL (ref 150–400)
RBC: 3.58 MIL/uL — ABNORMAL LOW (ref 3.87–5.11)
RDW: 13.8 % (ref 11.5–15.5)
WBC: 16.7 10*3/uL — ABNORMAL HIGH (ref 4.0–10.5)
nRBC: 0 % (ref 0.0–0.2)

## 2023-08-22 LAB — RESP PANEL BY RT-PCR (RSV, FLU A&B, COVID)  RVPGX2
Influenza A by PCR: NEGATIVE
Influenza B by PCR: NEGATIVE
Resp Syncytial Virus by PCR: NEGATIVE
SARS Coronavirus 2 by RT PCR: NEGATIVE

## 2023-08-22 MED ORDER — IPRATROPIUM-ALBUTEROL 0.5-2.5 (3) MG/3ML IN SOLN
3.0000 mL | Freq: Once | RESPIRATORY_TRACT | Status: AC
  Administered 2023-08-22: 3 mL via RESPIRATORY_TRACT
  Filled 2023-08-22: qty 3

## 2023-08-22 MED ORDER — METHYLPREDNISOLONE SODIUM SUCC 125 MG IJ SOLR
125.0000 mg | Freq: Once | INTRAMUSCULAR | Status: AC
  Administered 2023-08-22: 125 mg via INTRAVENOUS
  Filled 2023-08-22: qty 2

## 2023-08-22 MED ORDER — ALBUTEROL SULFATE (2.5 MG/3ML) 0.083% IN NEBU
2.5000 mg | INHALATION_SOLUTION | Freq: Once | RESPIRATORY_TRACT | Status: AC
  Administered 2023-08-22: 2.5 mg via RESPIRATORY_TRACT
  Filled 2023-08-22: qty 3

## 2023-08-22 MED ORDER — SODIUM CHLORIDE 0.9 % IV SOLN
500.0000 mg | Freq: Once | INTRAVENOUS | Status: AC
  Administered 2023-08-22: 500 mg via INTRAVENOUS
  Filled 2023-08-22: qty 5

## 2023-08-22 MED ORDER — PREDNISONE 20 MG PO TABS: ORAL_TABLET | ORAL | 0 refills | Status: DC

## 2023-08-22 MED ORDER — SODIUM CHLORIDE 0.9 % IV SOLN
2.0000 g | Freq: Once | INTRAVENOUS | Status: AC
  Administered 2023-08-22: 2 g via INTRAVENOUS
  Filled 2023-08-22: qty 20

## 2023-08-22 MED ORDER — DOXYCYCLINE HYCLATE 100 MG PO CAPS: 100.0000 mg | ORAL_CAPSULE | Freq: Two times a day (BID) | ORAL | 0 refills | Status: DC

## 2023-08-22 NOTE — Discharge Instructions (Signed)
Follow-up with your family doctor this week for recheck 

## 2023-08-22 NOTE — ED Triage Notes (Signed)
Last night pt started feeling more sob than normal. She checked her o2 and it was 77%. Pt left her o2 at 2L. Pt used her inhaler and it was not very helpful. Is labored with occasional audible wheezing in triage, she is also 97% on RA

## 2023-08-23 NOTE — ED Provider Notes (Signed)
[place] EMERGENCY DEPARTMENT AT [facility] Provider Note   CSN: [id number] Arrival date & time: 08/22/23  1036     History  Chief Complaint  Patient presents with   Shortness of Breath    Debra [name] is a 77 y.o. female.  Patient complains of dyspnea.  She also states her oxygen has been low  The history is provided by the patient and medical records. No language interpreter was used.  Shortness of Breath Severity:  Mild Onset quality:  Sudden Timing:  Intermittent Progression:  Resolved Chronicity:  New Relieved by:  Nothing Worsened by:  Nothing Ineffective treatments:  None tried Associated symptoms: no abdominal pain, no chest pain, no cough, no headaches and no rash        Home Medications Prior to Admission medications   Medication Sig Start Date End Date Taking? Authorizing Provider  acetaminophen (TYLENOL) 325 MG tablet Take 650 mg by mouth every 6 (six) hours as needed for moderate pain.   Yes [provider]  amLODipine (NORVASC) 5 MG tablet Take 5 mg by mouth daily.   Yes [provider]  aspirin 81 MG tablet Take 81 mg by mouth daily.   Yes [provider]  atorvastatin (LIPITOR) 80 MG tablet Take 80 mg by mouth daily.   Yes [provider]  Azelastine-Fluticasone (DYMISTA) 137-50 MCG/ACT SUSP Place 1 spray into both nostrils 2 (two) times daily.   Yes [provider]  Benralizumab (FASENRA) 30 MG/ML SOSY Inject 1 mL (30 mg total) into the skin every 8 (eight) weeks. 01/25/23  Yes Alfonse Spruce, MD  BREZTRI AEROSPHERE 160-9-4.8 MCG/ACT AERO Inhale 2 puffs into the lungs 2 (two) times daily. 09/17/22  Yes Alfonse Spruce, MD  calcitRIOL (ROCALTROL) 0.25 MCG capsule Take 0.25 mcg by mouth 2 (two) times a week.   Yes [provider]  Calcium Carb-Cholecalciferol (CALCIUM 600+D3 PO) Take 1 tablet by mouth daily.   Yes [provider]  cetirizine (ZYRTEC) 10 MG tablet Take 10  mg by mouth daily.   Yes [provider]  ciclopirox (LOPROX) 0.77 % cream Apply 1 Application topically 2 (two) times daily as needed (irritation on feet.). Apply to toenails, and feet 07/07/22  Yes [provider]  citalopram (CELEXA) 20 MG tablet Take 20 mg by mouth daily. 10/06/21  Yes [provider]  diclofenac Sodium (VOLTAREN) 1 % GEL Apply 2 g topically 4 (four) times daily as needed (pain).   Yes [provider]  doxycycline (VIBRAMYCIN) 100 MG capsule Take 1 capsule (100 mg total) by mouth 2 (two) times daily. One po bid x 7 days 08/22/23  Yes Bethann Berkshire, MD  fexofenadine (ALLEGRA) 180 MG tablet Take 1 tablet (180 mg total) by mouth 2 (two) times daily as needed for allergies or rhinitis. 04/18/23  Yes Alfonse Spruce, MD  fluticasone [facility]) 50 MCG/ACT nasal spray Place 1 spray into both nostrils daily as needed for allergies.   Yes [provider]  furosemide (LASIX) 20 MG tablet Take 20 mg by mouth daily as needed for edema. 07/12/21  Yes [provider]  gabapentin (NEURONTIN) 400 MG capsule Take 400 mg by mouth 3 (three) times daily. 07/22/21  Yes [provider]  hydrochlorothiazide (HYDRODIURIL) 12.5 MG tablet Take 12.5 mg by mouth daily. 07/24/19  Yes [provider]  ipratropium (ATROVENT) 0.06 % nasal spray Place 1 spray into both nostrils daily as needed for rhinitis.   Yes [provider]  ipratropium-albuterol (DUONEB) 0.5-2.5 (3) MG/3ML SOLN Inhale 3 mLs into the lungs every 4 (four) hours as needed (SOB/Wheezing). 06/20/18  Yes [provider]  loratadine (CLARITIN) 10 MG tablet Take 10 mg by mouth daily as needed for allergies.   Yes [provider]  metoprolol (LOPRESSOR) 100 MG tablet Take 100 mg by mouth 2 (two) times daily. 03/13/17  Yes [provider]  montelukast (SINGULAIR) 10 MG tablet TAKE 1 TABLET BY MOUTH EVERY DAY 03/21/23  Yes Alfonse Spruce, MD   nystatin (MYCOSTATIN) 100000 UNIT/ML suspension PLEASE SEE ATTACHED FOR DETAILED DIRECTIONS   Yes [provider]  olmesartan (BENICAR) 20 MG tablet TAKE 1 TABLET BY MOUTH EVERY DAY Patient taking differently: Take 20 mg by mouth daily. 05/06/22  Yes Nyoka Cowden, MD  OXYGEN Inhale 2 L into the lungs as needed.   Yes [provider]  predniSONE (DELTASONE) 20 MG tablet 2 tabs po daily x 3 days 08/22/23  Yes Bethann Berkshire, MD  Vitamin D, Ergocalciferol, (DRISDOL) 1.25 MG (50000 UT) CAPS capsule Take 50,000 Units by mouth every 30 (thirty) days. 05/03/19  Yes [provider]      Allergies    Patient has no known allergies.    Review of Systems   Review of Systems  Constitutional:  Negative for appetite change and fatigue.  HENT:  Negative for congestion, ear discharge and sinus pressure.   Eyes:  Negative for discharge.  Respiratory:  Positive for shortness of breath. Negative for cough.   Cardiovascular:  Negative for chest pain.  Gastrointestinal:  Negative for abdominal pain and diarrhea.  Genitourinary:  Negative for frequency and hematuria.  Musculoskeletal:  Negative for back pain.  Skin:  Negative for rash.  Neurological:  Negative for seizures and headaches.  Psychiatric/Behavioral:  Negative for hallucinations.     Physical Exam Updated Vital Signs BP 132/62 (BP Location: Right Arm)   Pulse 67   Temp 98.5 F (36.9 C) (Oral)   Resp (!) 27   Ht 5\' 2"  (1.575 m)   Wt 75.3 kg   SpO2 96%   BMI 30.36 kg/m  Physical Exam Constitutional:      Appearance: She is well-developed.  HENT:     Head: Normocephalic.  Eyes:     General: No scleral icterus.    Conjunctiva/sclera: Conjunctivae normal.  Neck:     Thyroid: No thyromegaly.  Cardiovascular:     Rate and Rhythm: Normal rate and regular rhythm.     Heart sounds: No murmur heard.    No friction rub. No gallop.  Pulmonary:     Breath sounds: No stridor. No wheezing or rales.  Chest:      Chest wall: No tenderness.  Abdominal:     General: There is no distension.     Tenderness: There is no abdominal tenderness. There is no rebound.  Musculoskeletal:        General: Normal range of motion.     Cervical back: Neck supple.  Lymphadenopathy:     Cervical: No cervical adenopathy.  Skin:    Findings: No erythema or rash.  Neurological:     Mental Status: She is oriented to person, place, and time.     Motor: No abnormal muscle tone.     Coordination: Coordination normal.  Psychiatric:        Behavior: Behavior normal.     ED Results / Procedures / Treatments   Labs (all labs ordered are listed, but only  abnormal results are displayed) Labs Reviewed  CBC WITH DIFFERENTIAL/PLATELET - Abnormal; Notable for the following components:      Result Value   WBC 16.7 (*)    RBC 3.58 (*)    Hemoglobin 10.6 (*)    HCT 32.7 (*)    Neutro Abs 12.9 (*)    All other components within normal limits  COMPREHENSIVE METABOLIC PANEL - Abnormal; Notable for the following components:   Glucose, Bld 111 (*)    BUN 27 (*)    Creatinine, Ser 1.69 (*)    Albumin 3.4 (*)    GFR, Estimated 31 (*)    All other components within normal limits  RESP PANEL BY RT-PCR (RSV, FLU A&B, COVID)  [id number]    EKG None  Radiology DG Chest Port 1 View  Result Date: 08/22/2023 CLINICAL DATA:  Shortness of breath EXAM: PORTABLE CHEST 1 VIEW COMPARISON:  12/11/2022 FINDINGS: 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 Aortic atherosclerosis. Left lung base is largely obscured. Probable bibasilar atelectasis. No pneumothorax. IMPRESSION: Probable bibasilar atelectasis. Electronically Signed   By: Duanne Guess D.O.   On: 08/22/2023 11:56    Procedures Procedures    Medications Ordered in ED Medications  methylPREDNISolone sodium succinate (SOLU-MEDROL) 125 mg/2 mL injection 125 mg (125 mg Intravenous Given 08/22/23 1136)  ipratropium-albuterol (DUONEB) 0.5-2.5 (3) MG/3ML nebulizer solution  3 mL (3 mLs Nebulization Given 08/22/23 1132)  albuterol (PROVENTIL) (2.5 MG/3ML) 0.083% nebulizer solution 2.5 mg (2.5 mg Nebulization Given 08/22/23 1132)  cefTRIAXone (ROCEPHIN) 2 g in sodium chloride 0.9 % 100 mL IVPB (0 g Intravenous Stopped 08/22/23 1257)  azithromycin (ZITHROMAX) 500 mg in sodium chloride 0.9 % 250 mL IVPB (0 mg Intravenous Stopped 08/22/23 1507)    ED Course/ Medical Decision Making/ A&P                                 Medical Decision Making Amount and/or Complexity of Data Reviewed Labs: ordered. Radiology: ordered. ECG/medicine tests: ordered.  Risk Prescription drug management.  Patient improved with neb treatments.  She is sent home with prednisone and doxycycline and will follow-up with PCP        Final Clinical Impression(s) / ED Diagnoses Final diagnoses:  COPD exacerbation (HCC)    Rx / DC Orders ED Discharge Orders          Ordered    doxycycline (VIBRAMYCIN) 100 MG capsule  2 times daily        08/22/23 1509    predniSONE (DELTASONE) 20 MG tablet        08/22/23 1509              Bethann Berkshire, MD 08/23/23 1635

## 2023-09-23 ENCOUNTER — Ambulatory Visit: Payer: Medicare PPO | Admitting: Family Medicine

## 2023-09-23 ENCOUNTER — Ambulatory Visit: Payer: Medicare PPO

## 2023-09-27 NOTE — Patient Instructions (Incomplete)
1. Asthma-COPD overlap syndrome -Information given on Dupixent to use in place of Fasenra.  Discussed how Dupixent was recently approved for COPD.  Give our office a call if you are interested in stopping Fasenra and starting Dupixent. -Continue to follow-up with pulmonology - Spoke with Scarlette Calico via telephone about getting lab work for severe asthma ordered. She will try to get this done next week. We will call you with results once they are all back - Daily controller medication(s): Fasenra 30mg  every 8 weeks and Breztri two puffs twice daily with spacer - Prior to physical activity: albuterol 2 puffs 10-15 minutes before physical activity. - Rescue medications: albuterol 4 puffs every 4-6 hours as needed - Asthma control goals:  * Full participation in all desired activities (may need albuterol before activity) * Albuterol use two time or less a week on average (not counting use with activity) * Cough interfering with sleep two time or less a month * Oral steroids no more than once a year * No hospitalizations  2. Perennial allergic rhinitis - Continue with Allegra one tablet daily (you can INCREASE to twice daily)  - Continue with Flonase one spray per nostril daily.  Try using this more frequently to see if this helps with your stuffy nose - Continue with nasal saline rinses as needed. -Continue ipratropium bromide 0.06% nasal spray using 1 spray each nostril once a day as needed for runny/drippy nose  3. Return in about 2 months or sooner if needed

## 2023-09-28 ENCOUNTER — Ambulatory Visit: Payer: Medicare PPO

## 2023-09-28 ENCOUNTER — Encounter: Payer: Self-pay | Admitting: Family

## 2023-09-28 ENCOUNTER — Ambulatory Visit: Payer: Medicare PPO | Admitting: Family

## 2023-09-28 VITALS — BP 130/60 | HR 62 | Temp 98.1°F | Resp 16 | Ht 62.0 in | Wt 167.4 lb

## 2023-09-28 DIAGNOSIS — J3089 Other allergic rhinitis: Secondary | ICD-10-CM | POA: Diagnosis not present

## 2023-09-28 DIAGNOSIS — B999 Unspecified infectious disease: Secondary | ICD-10-CM | POA: Diagnosis not present

## 2023-09-28 DIAGNOSIS — J4489 Other specified chronic obstructive pulmonary disease: Secondary | ICD-10-CM | POA: Diagnosis not present

## 2023-09-28 NOTE — Progress Notes (Cosign Needed Addendum)
[address] [place] [address] Dept: [phone]  FOLLOW UP NOTE  Patient ID: Debra [name], female    DOB: [birth year]  Age: 77 y.o. MRN: [id number] Date of Office Visit: 09/28/2023  Assessment  Chief Complaint: Follow-up  HPI Debra [name] is a 77 year old female who presents today for follow-up of asthma COPD overlap syndrome with severe restriction, perennial allergic rhinitis, previous smoker 30+ years, recurrent infections-antibiotics 3-4 times per year (sinusitis mostly but with excellent response to Pneumovax).  She was last seen on March 17, 2023 by Dr. Dellis Anes.  She denies any new diagnosis or surgery since her last office visit.  Asthma/COPD overlap syndrome with severe restriction:  She continues to to use Breztri 2 puffs twice a day with spacer, albuterol as needed, and Fasenra injections every 8 weeks.  She does feel like Harrington Challenger has helped her breathing.  She denies any problems or reactions with her Fasenra injections.  She reports that her breathing has been doing pretty good.  She reports a productive cough at times with clear sputum, off-and-on wheezing, and off-and-on short of breath.  She reports that she will be short of breath if she lifts something heavy or does something on an incline.  When she puts down the heavy items she is okay.  She denies fever, chills, tightness in chest, and nocturnal awakenings due to breathing problems.  She reports that she is now on oxygen 2 L continuous from her pulmonologist in [place] (Dr. Rondall Allegra Alghanim).  She sees Dr.Alghanim for emergencies since his office is closer to where she lives. Since her last office visit she did go to the emergency room on August 22, 2023 for COPD exacerbation.  She reports that she was feeling not well at the time and her oxygen dropped down to 88%.  She reports that she was told at the emergency room she had a touch of pneumonia.  While she was at the emergency room she was given  Solu-Medrol, albuterol, Rocephin, and azithromycin.  She was sent home with prednisone and doxycycline.  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 Aortic atherosclerosis. Left lung base is largely obscured. Probable bibasilar atelectasis. No pneumothorax.   IMPRESSION: Probable bibasilar atelectasis."  She uses her albuterol every so often.  This is approximately 2 to times a week depending on her activity.  She no longer smokes.  She has an upcoming appointment with Dr. Sherene Sires (pulmonology) next  month.  She is currently on prednisone 2 mg once a day for rheumatoid arthritis.   Perennial allergic rhinitis: She reports nasal congestion when she wakes up in the morning that she feels like causes her to have headaches.  She also has rhinorrhea at times.  She takes Allegra once a day and his been using Flonase nasal spray as needed, and ipratropium bromide 0.06% nasal spray as needed.  She has not had any sinus infections since we last saw her.  She reports that if she comes into contact with her allergies that it flares up her asthma/COPD.  Drug Allergies:  No Known Allergies  Review of Systems: Negative except as per HPI   Physical Exam: BP 130/60   Pulse 62   Temp 98.1 F (36.7 C)    Physical Exam Constitutional:      Appearance: Normal appearance.  Comments: Wearing oxygen  HENT:     Head: Normocephalic.     Comments: Pharynx normal, eyes normal, ears normal, nose: Bilateral lower turbinates mildly edematous with no drainage noted    Right Ear: Tympanic membrane, ear canal and external ear normal.     Left Ear: Tympanic membrane, ear canal and external ear normal.     Mouth/Throat:     Mouth: Mucous membranes are moist.     Pharynx: Oropharynx is clear.  Eyes:     Conjunctiva/sclera: Conjunctivae normal.   Cardiovascular:     Rate and Rhythm: Regular rhythm.     Heart sounds: Normal heart sounds.  Pulmonary:     Effort: Pulmonary effort is normal.     Comments: Rhonchi heard throughout. Musculoskeletal:     Cervical back: Neck supple.  Skin:    General: Skin is warm.  Neurological:     Mental Status: She is alert and oriented to person, place, and time.  Psychiatric:        Mood and Affect: Mood normal.        Behavior: Behavior normal.        Thought Content: Thought content normal.        Judgment: Judgment normal.     Diagnostics: FVC 0.72 L (33%), FEV1 0.56 L (33%), FEV1/FVC 0.78.  Spirometry indicates possible restrictive defect.  FEV1 is decreased from last office visit.  Assessment and Plan: 1. Asthma-COPD overlap syndrome (HCC)   2. Perennial allergic rhinitis   3. Recurrent infections     No orders of the defined types were placed in this encounter.   Patient Instructions  1. Asthma-COPD overlap syndrome -Information given on Dupixent to use in place of Fasenra.  Discussed how Dupixent was recently approved for COPD.  Give our office a call if you are interested in stopping Fasenra and starting Dupixent. -Continue to follow-up with pulmonology - Spoke with Scarlette Calico via telephone about getting lab work for severe asthma ordered. She will try to get this done next week. We will call you with results once they are all back - Daily controller medication(s): Fasenra 30mg  every 8 weeks and Breztri two puffs twice daily with spacer - Prior to physical activity: albuterol 2 puffs 10-15 minutes before physical activity. - Rescue medications: albuterol 4 puffs every 4-6 hours as needed - Asthma control goals:  * Full participation in all desired activities (may need albuterol before activity) * Albuterol use two time or less a week on average (not counting use with activity) * Cough interfering with sleep two time or less a month * Oral steroids no more than once a year *  No hospitalizations  2. Perennial allergic rhinitis - Continue with Allegra one tablet daily (you can INCREASE to twice daily)  - Continue with Flonase one spray per nostril daily.  Try using this more frequently to see if this helps with your stuffy nose - Continue with nasal saline rinses as needed. -Continue ipratropium bromide 0.06% nasal spray using 1 spray each nostril once a day as needed for runny/drippy nose  3. Return in about 2 months or sooner if needed        Return in about 2 months (around 11/28/2023), or if symptoms worsen or fail to improve.    Thank you for the opportunity to care for this patient.  Please do not hesitate to contact me with questions.  Nehemiah Settle, FNP [facility]

## 2023-09-28 NOTE — Addendum Note (Signed)
Addended by: Nehemiah Settle on: 09/28/2023 02:51 PM   Modules accepted: Orders

## 2023-10-07 LAB — ALPHA-1-ANTITRYPSIN: A-1 Antitrypsin: 152 mg/dL (ref 101–187)

## 2023-10-07 LAB — CBC WITH DIFFERENTIAL/PLATELET
Basophils Absolute: 0 10*3/uL (ref 0.0–0.2)
Basos: 0 %
EOS (ABSOLUTE): 0 10*3/uL (ref 0.0–0.4)
Eos: 0 %
Hematocrit: 34.8 % (ref 34.0–46.6)
Hemoglobin: 11.4 g/dL (ref 11.1–15.9)
Immature Grans (Abs): 0 10*3/uL (ref 0.0–0.1)
Immature Granulocytes: 0 %
Lymphocytes Absolute: 2.5 10*3/uL (ref 0.7–3.1)
Lymphs: 34 %
MCH: 29.4 pg (ref 26.6–33.0)
MCHC: 32.8 g/dL (ref 31.5–35.7)
MCV: 90 fL (ref 79–97)
Monocytes Absolute: 0.5 10*3/uL (ref 0.1–0.9)
Monocytes: 6 %
Neutrophils Absolute: 4.5 10*3/uL (ref 1.4–7.0)
Neutrophils: 60 %
Platelets: 243 10*3/uL (ref 150–450)
RBC: 3.88 x10E6/uL (ref 3.77–5.28)
RDW: 13.8 % (ref 11.7–15.4)
WBC: 7.5 10*3/uL (ref 3.4–10.8)

## 2023-10-07 LAB — ANCA TITERS
Atypical pANCA: <1:20 {titer}
C-ANCA: <1:20 {titer}
P-ANCA: <1:20 {titer}

## 2023-10-07 LAB — ASPERGILLUS PRECIPITINS
A.Fumigatus #1 Abs: NEGATIVE
Aspergillus Flavus Antibodies: NEGATIVE
Aspergillus Niger Antibodies: NEGATIVE
Aspergillus glaucus IgG: NEGATIVE
Aspergillus nidulans IgG: NEGATIVE
Aspergillus terreus IgG: NEGATIVE

## 2023-10-07 LAB — IGE: IgE (Immunoglobulin E), Serum: 363 [IU]/mL (ref 6–495)

## 2023-10-07 NOTE — Progress Notes (Signed)
Please let Debra [name] know that her lab work looking for hard to treat asthma was normal. This is good news.   Routing to Dr. Dellis Anes as an [place]ain Childes.

## 2023-10-11 NOTE — Progress Notes (Signed)
Thanks

## 2023-10-25 ENCOUNTER — Ambulatory Visit: Payer: Medicare PPO | Admitting: Internal Medicine

## 2023-10-25 ENCOUNTER — Telehealth: Payer: Self-pay | Admitting: Internal Medicine

## 2023-10-25 ENCOUNTER — Other Ambulatory Visit: Payer: Self-pay

## 2023-10-25 ENCOUNTER — Encounter: Payer: Self-pay | Admitting: Internal Medicine

## 2023-10-25 VITALS — BP 126/63 | HR 70 | Ht 62.0 in | Wt 166.0 lb

## 2023-10-25 DIAGNOSIS — I10 Essential (primary) hypertension: Secondary | ICD-10-CM | POA: Insufficient documentation

## 2023-10-25 DIAGNOSIS — J449 Chronic obstructive pulmonary disease, unspecified: Secondary | ICD-10-CM | POA: Insufficient documentation

## 2023-10-25 DIAGNOSIS — E785 Hyperlipidemia, unspecified: Secondary | ICD-10-CM | POA: Insufficient documentation

## 2023-10-25 DIAGNOSIS — J4489 Other specified chronic obstructive pulmonary disease: Secondary | ICD-10-CM | POA: Diagnosis not present

## 2023-10-25 DIAGNOSIS — J4540 Moderate persistent asthma, uncomplicated: Secondary | ICD-10-CM | POA: Insufficient documentation

## 2023-10-25 DIAGNOSIS — R0602 Shortness of breath: Secondary | ICD-10-CM | POA: Insufficient documentation

## 2023-10-25 DIAGNOSIS — R0600 Dyspnea, unspecified: Secondary | ICD-10-CM | POA: Insufficient documentation

## 2023-10-25 DIAGNOSIS — E7800 Pure hypercholesterolemia, unspecified: Secondary | ICD-10-CM | POA: Insufficient documentation

## 2023-10-25 DIAGNOSIS — I25810 Atherosclerosis of coronary artery bypass graft(s) without angina pectoris: Secondary | ICD-10-CM | POA: Insufficient documentation

## 2023-10-25 DIAGNOSIS — I2510 Atherosclerotic heart disease of native coronary artery without angina pectoris: Secondary | ICD-10-CM | POA: Insufficient documentation

## 2023-10-25 DIAGNOSIS — J479 Bronchiectasis, uncomplicated: Secondary | ICD-10-CM | POA: Insufficient documentation

## 2023-10-25 DIAGNOSIS — J9611 Chronic respiratory failure with hypoxia: Secondary | ICD-10-CM | POA: Insufficient documentation

## 2023-10-25 DIAGNOSIS — J9612 Chronic respiratory failure with hypercapnia: Secondary | ICD-10-CM | POA: Diagnosis not present

## 2023-10-25 NOTE — Assessment & Plan Note (Signed)
Quit smoking 2013 with multiple spirometries showing min obsruction/ mostly restriction  - [facility] [place] rx spring 2021 [facility]>>> - Daily pred per arthritic doctor as of 1st pulmonary eval 06/11/2021 with [place] hump noted - 06/11/2021 rec trial off ACEi   - 06/11/2022  After extensive coaching inhaler device,  effectiveness =    25% with hfa  (late insp, very short Ti)   Spirometry 10/04/23 restrictive with min curvature to f/v loop  - 10/25/2023  After extensive coaching inhaler device,  effectiveness =    near 0 due to very short TI improved with empty breztri divide to teach inspiratory technique   Clearly doesn't have copd at this point and not even clear how much true asthma is still present now that on fasenra  Rec She either needs to lear good hfa technique to switch to DPI or just nebs prn at this point / advised.  Also: In the setting of respiratory symptoms of unknown etiology,  It would be preferable to use bystolic, the most beta -1  selective Beta blocker available in sample form, with bisoprolol the most selective generic choice  on the market, at least on a trial basis, to make sure the spillover Beta 2 effects of the less specific Beta blockers are not contributing to this patient's symptoms.  Would consider bisoprolol 10 mg bid in place of lopressor 100 mg bid > defer to PCP  Rec return for full PFT to work out the restrictive component which I feel is most likely due to poor abd compliance from obesity.

## 2023-10-25 NOTE — Telephone Encounter (Signed)
Spoke with patient regarding the 12/01/23 2:00 PM PFT appointment at [facility] time is 1:45 pm at the 1st floor registration desk,  Will mail information to patient and she voiced her understanding.

## 2023-10-25 NOTE — Telephone Encounter (Signed)
Left message for patient to call and discuss scheduling the PFT's and 3 month follow up with Dr. Sherene Sires

## 2023-10-25 NOTE — Assessment & Plan Note (Addendum)
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   Each maintenance medication was reviewed in detail including emphasizing most importantly the difference between maintenance and prns and under what circumstances the prns are to be triggered using an action plan format where appropriate.  Total time for H and P, chart review, counseling, reviewing hfa/02/pulse ox device(s) , directly observing portions of ambulatory 02 saturation study/ and generating customized AVS unique to this office visit / same day charting  > 40 min for multiple  refractory respiratory  symptoms of uncertain etiology

## 2023-10-25 NOTE — Patient Instructions (Addendum)
Work on inhaler technique:  relax and gently blow all the way out then take a nice smooth full deep breath back in, triggering the inhaler at same time you start breathing in.  Hold breath in for at least  5 seconds if you can. Blow out breztri thru nose. Rinse and gargle with water when done.  If mouth or throat bother you at all,  try brushing teeth/gums/tongue with arm and hammer toothpaste/ make a slurry and gargle and spit out.   Make sure you check your oxygen saturation  AT  your highest level of activity (not after you stop)   to be sure it stays over 90% and adjust  02 flow upward to maintain this level if needed but remember to turn it back to previous settings when you stop (to conserve your supply).    My office will be contacting you by phone for referral for PFTs   - if you don't hear back from my office within one week please call us back or notify us thru MyChart and we'll address it right away    You are eligible for a free low dose CT chest until 2028 > defer to PCP   You should consider changing lopressor 100  to Bisoprolol 10 mg twice daily  > defer PCP    Please schedule a follow up visit in 3 months but call sooner if needed  with all medications /inhalers/ solutions in hand so we can verify exactly what you are taking. This includes all medications from all doctors and over the counters

## 2023-10-25 NOTE — Progress Notes (Signed)
Debra [name], female    DOB: 31-Oct-1946   MRN: [id number]   Brief patient profile:  11  yobf quit smoking 2013 with onset of rhinitis/ asthma in her 11s better since rx fosenra from [facility] 2021 prev under care of Orson Aloe referred to pulmonary clinic in [place]  06/11/2021 by [facility]    History of Present Illness  06/11/2021  Pulmonary/ 1st office eval/ Hanif Radin / [place] [facility]  maint on breztri 2 bid / acei and prednisone per arthritis doctor/ fasenra per Dr Dellis Anes Chief Complaint  Patient presents with   Consult    Patient reports she was diagnosed with COPD 7 years ago, 04/2012. She reports she may take a while to get some sleep.    Dyspnea:  limited by knees > sob/ able to weed eat and do most yard work  Cough: none  Sleep: sleep flat bed/ one pillow  SABA use: last used 6/20 when got hot assoc with subjective wheeze   Still on prednisone per Dr Orvan Falconer for arthritis  Rec Plan A = Automatic = Always=    breztri Take 2 puffs first thing in am and then another 2 puffs about 12 hours later.   Work on inhaler technique:   Plan B = Backup (to supplement plan A, not to replace it) Only use your albuterol inhaler as a rescue medication  Plan C = Crisis (instead of Plan B but only if Plan B stops working) - only use your albuterol nebulizer if you first try Plan B   Stop lisinopril and replace with olmesartan 20 mg one daily and your wheezing should improve and let your primary doctor know    Spirometry 10/04/23 restrictive with min curvature to f/v loop    10/25/2023  f/u ov/[place] office/Georgio Hattabaugh re: asthma maint on singulair breztri/fasenra high dose lopressor Chief Complaint  Patient presents with   Asthma-COPD overlap syndrome  Dyspnea:  walking at [employer] at least once or twice a week pushing cart s 02 /uses [facility] parking sometimes  Cough: resolved  Sleeping: flat bed one pillow s   resp cc  SABA use: rarely, only uses neb when gets sick   02: just started in Aug 2024 2lpm hs and prn but not titrating daytime   Lung cancer screening: deferred to PCP   No obvious day to day or daytime variability or assoc excess/ purulent sputum or mucus plugs or hemoptysis or cp or chest tightness, subjective wheeze or overt sinus or hb symptoms.    Also denies any obvious fluctuation of symptoms with weather or environmental changes or other aggravating or alleviating factors except as outlined above   No unusual exposure hx or h/o childhood pna/ asthma or knowledge of premature birth.  Current Allergies, Complete Past Medical History, Past Surgical History, Family History, and Social History were reviewed in [employer] record.  ROS  The following are not active complaints unless bolded Hoarseness, sore throat, dysphagia, dental problems, itching, sneezing,  nasal congestion or discharge of excess mucus or purulent secretions, ear ache,   fever, chills, sweats, unintended wt loss or wt gain, classically pleuritic or exertional cp,  orthopnea pnd or arm/hand swelling  or leg swelling, presyncope, palpitations, abdominal pain, anorexia, nausea, vomiting, diarrhea  or change in bowel habits or change in bladder habits, change in stools or change in urine, dysuria, hematuria,  rash, arthralgias, visual complaints, headache, numbness, weakness or ataxia or problems with walking or coordination,  change in mood or  memory.        Current Meds  Medication Sig   acetaminophen (TYLENOL) 325 MG tablet Take 650 mg by mouth every 6 (six) hours as needed for moderate pain.   albuterol (PROVENTIL) (2.5 MG/3ML) 0.083% nebulizer solution Take 2.5 mg by nebulization every 6 (six) hours as needed.   amLODipine (NORVASC) 5 MG tablet Take 5 mg by mouth daily.   aspirin 81 MG tablet Take 81 mg by mouth daily.   atorvastatin (LIPITOR) 80 MG tablet Take 80 mg by mouth daily.   Azelastine-Fluticasone (DYMISTA) 137-50 MCG/ACT SUSP Place 1  spray into both nostrils 2 (two) times daily.   Benralizumab (FASENRA) 30 MG/ML SOSY Inject 1 mL (30 mg total) into the skin every 8 (eight) weeks.   BREZTRI AEROSPHERE 160-9-4.8 MCG/ACT AERO Inhale 2 puffs into the lungs 2 (two) times daily.   calcitRIOL (ROCALTROL) 0.25 MCG capsule Take 0.25 mcg by mouth 2 (two) times a week.   Calcium Carb-Cholecalciferol (CALCIUM 600+D3 PO) Take 1 tablet by mouth daily.   ciclopirox (LOPROX) 0.77 % cream Apply 1 Application topically 2 (two) times daily as needed (irritation on feet.). Apply to toenails, and feet   citalopram (CELEXA) 20 MG tablet Take 20 mg by mouth daily.   diclofenac Sodium (VOLTAREN) 1 % GEL Apply 2 g topically 4 (four) times daily as needed (pain).   fexofenadine (ALLEGRA) 180 MG tablet Take 1 tablet (180 mg total) by mouth 2 (two) times daily as needed for allergies or rhinitis.   fluticasone (FLONASE) 50 MCG/ACT nasal spray Place 1 spray into both nostrils daily as needed for allergies.   furosemide (LASIX) 20 MG tablet Take 20 mg by mouth daily as needed for edema.   gabapentin (NEURONTIN) 400 MG capsule Take 400 mg by mouth 3 (three) times daily.   hydrochlorothiazide (HYDRODIURIL) 12.5 MG tablet Take 12.5 mg by mouth daily.   ipratropium (ATROVENT) 0.06 % nasal spray Place 1 spray into both nostrils daily as needed for rhinitis.   ipratropium-albuterol (DUONEB) 0.5-2.5 (3) MG/3ML SOLN Inhale 3 mLs into the lungs every 4 (four) hours as needed (SOB/Wheezing).   loratadine (CLARITIN) 10 MG tablet Take 10 mg by mouth daily as needed for allergies.   metoprolol (LOPRESSOR) 100 MG tablet Take 100 mg by mouth 2 (two) times daily.   montelukast (SINGULAIR) 10 MG tablet TAKE 1 TABLET BY MOUTH EVERY DAY   nystatin (MYCOSTATIN) 100000 UNIT/ML suspension PLEASE SEE ATTACHED FOR DETAILED DIRECTIONS   olmesartan (BENICAR) 20 MG tablet TAKE 1 TABLET BY MOUTH EVERY DAY (Patient taking differently: Take 20 mg by mouth daily.)   OXYGEN Inhale 2 L  into the lungs as needed.   predniSONE (DELTASONE) 20 MG tablet 2 tabs po daily x 3 days   Vitamin D, Ergocalciferol, (DRISDOL) 1.25 MG (50000 UT) CAPS capsule Take 50,000 Units by mouth every 30 (thirty) days.   Current Facility-Administered Medications for the 10/25/23 encounter (Office Visit) with Nyoka Cowden, MD  Medication   Benralizumab SOSY 30 mg             Past Medical History:  Diagnosis Date   Allergic rhinitis    Asthma    COPD (chronic obstructive pulmonary disease) (HCC)    Diabetes mellitus without complication (HCC)    Hyperlipidemia    Hypertension    Recurrent upper respiratory infection (URI)       Objective:    Wts  10/25/2023       166  10/15/2022  169  06/11/2022       176   06/11/21 182 lb (82.6 kg)  03/25/21 188 lb 3.2 oz (85.4 kg)  12/03/20 182 lb 3.2 oz (82.6 kg)     Vital signs reviewed  10/25/2023  - Note at rest 02 sats  98% on 2lpm cont    General appearance:    obese wf nad/mild pseudowheeze        HEENT : Oropharynx  clear      Nasal turbinates nl    NECK :  without  apparent JVD/ palpable Nodes/TM    LUNGS: no acc muscle use,  Nl contour chest which is clear to A and P bilaterally without cough on insp or exp maneuvers   CV:  RRR  no s3 or murmur or increase in P2, and no edema   ABD: tensely obese but nontender with nl inspiratory excursion in the supine position. No bruits or organomegaly appreciated   MS:  Nl gait/ ext warm without deformities Or obvious joint restrictions  calf tenderness, cyanosis or clubbing    SKIN: warm and dry without lesions    NEURO:  alert, approp, nl sensorium with  no motor or cerebellar deficits apparent.            Assessment

## 2023-10-25 NOTE — Telephone Encounter (Signed)
3 month follow up is schedule with Dr.Wert. Patient just needs a PFT to be scheduled.

## 2023-10-26 ENCOUNTER — Encounter: Payer: Self-pay | Admitting: Obstetrics & Gynecology

## 2023-10-26 ENCOUNTER — Ambulatory Visit: Payer: Medicare PPO | Admitting: Obstetrics & Gynecology

## 2023-10-26 VITALS — BP 125/63 | HR 61 | Ht 62.0 in | Wt 165.8 lb

## 2023-10-26 DIAGNOSIS — N8110 Cystocele, unspecified: Secondary | ICD-10-CM | POA: Diagnosis not present

## 2023-10-26 DIAGNOSIS — L309 Dermatitis, unspecified: Secondary | ICD-10-CM

## 2023-10-26 NOTE — Progress Notes (Signed)
   GYN VISIT Patient name: Debra [name] MRN [id number]  Date of birth: 1946-05-13 Chief Complaint:   Follow-up  History of Present Illness:   Debra [name] is a 77 y.o. [id number] PM, PH female being seen today for POP.  [name], the pessary had fallen out and she did not have the device with her.  At that time, she did think it had helped and was considering replacement.  She did try to put it in at home, but was unsuccessful.  Today she notes that actually she has been doing ok without it.  She may feel the bulge and some pressure on occasion, but not regularly and it does not seem to bother her.  She denies any urinary concerns.  Overall doing well and reports no acute complaints.  No LMP recorded. Patient has had a hysterectomy.    Review of Systems:   Pertinent items are noted in HPI Denies fever/chills, dizziness, headaches, visual disturbances, fatigue, shortness of breath, chest pain, abdominal pain, vomiting. Pertinent History Reviewed:   Past Surgical History:  Procedure Laterality Date   CHOLECYSTECTOMY     CORONARY ARTERY BYPASS GRAFT     PARTIAL HYSTERECTOMY  1981   ovaries left in situ, was done for fibroids   ROBOTIC ASSISTED BILATERAL SALPINGO OOPHERECTOMY Bilateral 07/27/2022   Procedure: XI ROBOTIC ASSISTED BILATERAL SALPINGO OOPHORECTOMY STAGING,;  Surgeon: Carver Fila, MD;  Location: [facility];  Service: Gynecology;  Laterality: Bilateral;    Past Medical History:  Diagnosis Date   Allergic rhinitis    Arthritis    Asthma    Chronic kidney disease (CKD)    COPD (chronic obstructive pulmonary disease) (HCC)    Diabetes mellitus without complication (HCC)    Dyspnea    Hyperlipidemia    Hypertension    Hypertensive chronic kidney disease    Recurrent upper respiratory infection (URI)    Secondary hyperparathyroidism of renal origin [facility])    Reviewed problem list, medications and allergies. Physical Assessment:   Vitals:   10/26/23  1110  BP: 125/63  Pulse: 61  Weight: 165 lb 12.8 oz (75.2 kg)  Height: 5\' 2"  (1.575 m)  Body mass index is 30.33 kg/m.       Physical Examination:   General appearance: alert, well appearing, and in no distress  Psych: mood appropriate, normal affect  Skin: warm & dry   Cardiovascular: normal heart rate noted  Respiratory: normal respiratory effort, no distress  Abdomen: soft, non-tender   Pelvic: VULVA: patchy pink skin changes-same as previously seen VAGINA: normal appearing vagina with normal color and discharge, no lesions.  No change in prolapse- Stage1 cystocele noted.  Uterus and cervix surgically absent  Extremities: no edema   Chaperone: Faith Rogue    Assessment & Plan:  1) Pelvic organ prolapse -plan to hold off on pessary replacement at this time -f/u prn []  pt to bring back in her milex ring #2 -reviewed conservative treatment  2) Vulvar dermatitis -continue with steroid cream as needed   Return in about 1 year (around 10/25/2024) for annual/follow up.   Myna Hidalgo, DO Attending Obstetrician & Gynecologist, [facility] for [employer], [facility]

## 2023-10-27 ENCOUNTER — Other Ambulatory Visit: Payer: Self-pay | Admitting: Allergy & Immunology

## 2023-10-28 NOTE — Telephone Encounter (Signed)
A refill was requested on Singulair 10 mg. She did not mention this medication to me at her last office visit  and not with your visit in March 2024

## 2023-10-31 NOTE — Telephone Encounter (Signed)
Thank you :)

## 2023-11-02 ENCOUNTER — Telehealth: Payer: Self-pay | Admitting: Allergy & Immunology

## 2023-11-02 MED ORDER — BREZTRI AEROSPHERE 160-9-4.8 MCG/ACT IN AERO: 2.0000 | INHALATION_SPRAY | Freq: Two times a day (BID) | RESPIRATORY_TRACT | 3 refills | Status: DC

## 2023-11-02 NOTE — Telephone Encounter (Signed)
Patient came in with [employer] paperwork and stated she needed [place] to send her prescription of BrezTri to the AZ[employer]ME fax number.

## 2023-11-02 NOTE — Telephone Encounter (Signed)
Papers as well as prescription have been faxed to [employer] at [phone]

## 2023-11-14 ENCOUNTER — Telehealth: Payer: Self-pay

## 2023-11-14 NOTE — Telephone Encounter (Signed)
Patient called and she is interested in starting Dupixent. She is going to get her Harrington Challenger on 11/21/2023.

## 2023-11-15 NOTE — Telephone Encounter (Signed)
Great - I think we need to try something else since she is still needing so much prednisone.  Malachi Bonds, MD [facility]

## 2023-11-21 ENCOUNTER — Ambulatory Visit: Payer: Medicare PPO

## 2023-11-21 DIAGNOSIS — J4550 Severe persistent asthma, uncomplicated: Secondary | ICD-10-CM

## 2023-11-25 NOTE — Telephone Encounter (Signed)
L/m for patient to contact me to discuss Dupixent

## 2023-11-29 NOTE — Telephone Encounter (Signed)
Spoke to patient and sounds like she will be eligible with new financial MCR requirements for Dupixent My Way. Will mail app for her to fill out and advised he it may be delay in approvals for PAP due to end of re-enrollment for all [facility] assistance programs

## 2023-11-29 NOTE — Telephone Encounter (Signed)
Patient called and I called back and l/m to contact me

## 2023-11-30 NOTE — Telephone Encounter (Signed)
Awesome - thank you for taking care of her!   Malachi Bonds, MD [facility]

## 2023-12-01 ENCOUNTER — Ambulatory Visit (HOSPITAL_COMMUNITY)
Admission: RE | Admit: 2023-12-01 | Discharge: 2023-12-01 | Disposition: A | Discharge status: Home / Self Care | Payer: Medicare PPO | Source: Ambulatory Visit | Attending: Internal Medicine | Admitting: Internal Medicine

## 2023-12-01 DIAGNOSIS — J4489 Other specified chronic obstructive pulmonary disease: Secondary | ICD-10-CM | POA: Insufficient documentation

## 2023-12-01 LAB — PULMONARY FUNCTION TEST
DL/VA % pred: 92 %
DL/VA: 3.9 ml/min/mmHg/L
DLCO unc % pred: 44 %
DLCO unc: 7.91 ml/min/mmHg
FEF 25-75 Post: 1.15 L/s
FEF 25-75 Pre: 0.57 L/s
FEF2575-%Change-Post: 100 %
FEF2575-%Pred-Post: 74 %
FEF2575-%Pred-Pre: 36 %
FEV1-%Change-Post: 17 %
FEV1-%Pred-Post: 40 %
FEV1-%Pred-Pre: 34 %
FEV1-Post: 0.77 L
FEV1-Pre: 0.66 L
FEV1FVC-%Change-Post: 1 %
FEV1FVC-%Pred-Pre: 102 %
FEV6-%Change-Post: 19 %
FEV6-%Pred-Post: 40 %
FEV6-%Pred-Pre: 34 %
FEV6-Post: 1 L
FEV6-Pre: 0.84 L
FEV6FVC-%Pred-Post: 105 %
FEV6FVC-%Pred-Pre: 105 %
FVC-%Change-Post: 15 %
FVC-%Pred-Post: 38 %
FVC-%Pred-Pre: 33 %
FVC-Post: 1 L
FVC-Pre: 0.86 L
Post FEV1/FVC ratio: 77 %
Post FEV6/FVC ratio: 100 %
Pre FEV1/FVC ratio: 76 %
Pre FEV6/FVC Ratio: 100 %
RV % pred: 78 %
RV: 1.72 L
TLC % pred: 57 %
TLC: 2.75 L

## 2023-12-01 MED ORDER — ALBUTEROL SULFATE (2.5 MG/3ML) 0.083% IN NEBU
2.5000 mg | INHALATION_SOLUTION | Freq: Once | RESPIRATORY_TRACT | Status: AC
  Administered 2023-12-01: 2.5 mg via RESPIRATORY_TRACT

## 2023-12-02 ENCOUNTER — Ambulatory Visit: Payer: Medicare PPO | Admitting: Allergy & Immunology

## 2023-12-02 ENCOUNTER — Encounter: Payer: Self-pay | Admitting: Allergy & Immunology

## 2023-12-02 VITALS — BP 160/70 | HR 82 | Temp 98.8°F | Resp 22 | Wt 169.2 lb

## 2023-12-02 DIAGNOSIS — J4551 Severe persistent asthma with (acute) exacerbation: Secondary | ICD-10-CM

## 2023-12-02 DIAGNOSIS — J3089 Other allergic rhinitis: Secondary | ICD-10-CM | POA: Diagnosis not present

## 2023-12-02 DIAGNOSIS — B999 Unspecified infectious disease: Secondary | ICD-10-CM | POA: Diagnosis not present

## 2023-12-02 MED ORDER — METHYLPREDNISOLONE ACETATE 80 MG/ML IJ SUSP
80.0000 mg | Freq: Once | INTRAMUSCULAR | Status: AC
Start: 2023-12-02 — End: 2023-12-02
  Administered 2023-12-02: 80 mg via INTRAMUSCULAR

## 2023-12-02 MED ORDER — PREDNISONE 10 MG PO TABS: ORAL_TABLET | ORAL | 0 refills | Status: DC

## 2023-12-02 MED ORDER — AMOXICILLIN-POT CLAVULANATE 875-125 MG PO TABS: 1.0000 | ORAL_TABLET | Freq: Two times a day (BID) | ORAL | 0 refills | Status: AC

## 2023-12-02 MED ORDER — PROMETHAZINE-DM 6.25-15 MG/5ML PO SYRP: 5.0000 mL | ORAL_SOLUTION | Freq: Four times a day (QID) | ORAL | 0 refills | Status: DC | PRN

## 2023-12-02 NOTE — Progress Notes (Unsigned)
FOLLOW UP  Date of Service/Encounter:  12/02/23   Assessment:   Asthma-COPD overlap syndrome - with severe stable restriction improved on Fasenra (followed by Dr. Sherene Sires)   Perennial allergic rhinitis   Previous smoker (30+ years)   Recurrent infections - antibiotics 3-4 times per year (sinusitis mostly) but with excellent response to Pnuemovax   RA - on chronic prednisone  Plan/Recommendations:   Assessment and Plan              Patient Instructions  1. Asthma-COPD overlap syndrome with acute exacerbation - Lung testing not done since you were having such a hard time breathing.  - We gave you two neb treatments with improvement in your symptoms. - We did give you a DepoMedrol injection. - Start the prednisone taper provided.  - Start the cough medicine 5 mL as needed (paper script provided). - We are not going to make any medication changes today.  - Be sure to fill out that form when you get it in the mail.  - Daily controller medication(s): Fasenra 30mg  every 8 weeks and Breztri two puffs twice daily with spacer - Prior to physical activity: albuterol 2 puffs 10-15 minutes before physical activity. - Rescue medications: albuterol 4 puffs every 4-6 hours as needed - Asthma control goals:  * Full participation in all desired activities (may need albuterol before activity) * Albuterol use two time or less a week on average (not counting use with activity) * Cough interfering with sleep two time or less a month * Oral steroids no more than once a year * No hospitalizations  2. Perennial allergic rhinitis - with overlying sinusitis - Continue with Allegra one tablet daily (you can INCREASE to twice daily)  - Continue with Flonase one spray per nostril daily.  - Continue with nasal saline rinses as needed. - Start Augmetin one tablet twice daily for ten days.  - Call us Monday with an update.   3. Return in about 2 months (around 02/02/2024). You can have the follow  up appointment with Dr. Dellis [name] or a Nurse Practicioner (our Nurse Practitioners are excellent and always have Physician oversight!).    Please inform us of any Emergency Department visits, hospitalizations, or changes in symptoms. Call us before going to the ED for breathing or allergy symptoms since we might be able to fit you in for a sick visit. Feel free to contact us anytime with any questions, problems, or concerns.  It was a pleasure to see you again today! FEEL BETTER!   Websites that have reliable patient information: 1. American Academy of Asthma, Allergy, and Immunology: www.aaaai.org 2. Food Allergy Research and Education (FARE): foodallergy.org 3. Mothers of Asthmatics: http://www.asthmacommunitynetwork.org 4. American College of Allergy, Asthma, and Immunology: www.acaai.org      "Like" [place] on Facebook and Instagram for our latest updates!      A healthy democracy works best when [employer] participate! Make sure you are registered to vote! If you have moved or changed any of your contact information, you will need to get this updated before voting! Scan the QR codes below to learn more!                 Subjective:   Debra [name] is a 77 y.o. female presenting today for follow up of  Chief Complaint  Patient presents with  . Follow-up    Debra [name] has a history of the following: Patient Active Problem List   Diagnosis Date Noted  .  Hyperlipidemia 10/25/2023  . CAD (coronary artery disease) 10/25/2023  . Chronic obstructive pulmonary disease (HCC) 10/25/2023  . Hypertension 10/25/2023  . Dyspnea 10/25/2023  . 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 Moderate persistent asthma, uncomplicated 10/25/2023  . Coronary arteriosclerosis 10/25/2023  . Shortness of breath 10/25/2023  . Cylindrical bronchiectasis (HCC) 10/25/2023  . Hypercholesteremia 10/25/2023  . Chronic respiratory failure with hypoxia and  hypercapnia (HCC) 10/25/2023  . COPD with acute exacerbation (HCC) 12/11/2022  . Depression 12/11/2022  . Chronic kidney disease, stage 3b (HCC) 12/11/2022  . Sepsis (HCC) 12/11/2022  . CAP (community acquired pneumonia) 12/10/2022  . Pelvic mass 07/27/2022  . Ovarian cyst 03/08/2022  . Essential hypertension 06/11/2021  . Benign hypertensive kidney disease with chronic kidney disease 09/11/2019  . Proteinuria 09/11/2019  . Asthma-COPD overlap syndrome 09/28/2016  . Perennial allergic rhinitis 09/28/2016    History obtained from: chart review and {Persons; PED relatives w/patient:19415::"patient"}.  Discussed the use of AI scribe software for clinical note transcription with the patient and/or guardian, who gave verbal consent to proceed.  Debra [name] is a 77 y.o. female presenting for {Blank single:19197::"a food challenge","a drug challenge","skin testing","a sick visit","an evaluation of ***","a follow up visit"}.  She was last seen in October 2024 by Debra [name], one of our nurse practitioners.  At that time, we gave her information to use on Dupixent instead of Fasenra.  We obtain labs to look for causes of severe asthma.  We continue with her Breztri 2 puffs twice daily and albuterol as needed.  For her rhinitis, we continue with Allegra and Flonase as well as ipratropium.  Her workup for severe causes of asthma was negative.  Discussed the use of AI scribe software for clinical note transcription with the patient, who gave verbal consent to proceed.  History of Present Illness            {Blank single:19197::"Asthma/Respiratory Symptom History: ***"," "}  {Blank single:19197::"Allergic Rhinitis Symptom History: ***"," "}  {Blank single:19197::"Food Allergy Symptom History: ***"," "}  {Blank single:19197::"Skin Symptom History: ***"," "}  {Blank single:19197::"GERD Symptom History: ***"," "}  Otherwise, there have been no changes to her past medical history, surgical history,  family history, or social history.    Review of systems otherwise negative other than that mentioned in the HPI.    Objective:   Blood pressure (!) 160/70, pulse 82, temperature 98.8 F (37.1 C), resp. rate (!) 22, weight 169 lb 4 oz (76.8 kg), SpO2 90%. Body mass index is 30.96 kg/m.    Physical Exam   Diagnostic studies: {Blank single:19197::"none","deferred due to recent antihistamine use","deferred due to insurance stipulations that require a separate visit for testing","labs sent instead"," "}  Spirometry: {Blank single:19197::"results normal (FEV1: ***%, FVC: ***%, FEV1/FVC: ***%)","results abnormal (FEV1: ***%, FVC: ***%, FEV1/FVC: ***%)"}.    {Blank single:19197::"Spirometry consistent with mild obstructive disease","Spirometry consistent with moderate obstructive disease","Spirometry consistent with severe obstructive disease","Spirometry consistent with possible restrictive disease","Spirometry consistent with mixed obstructive and restrictive disease","Spirometry uninterpretable due to technique","Spirometry consistent with normal pattern"}. {Blank single:19197::"Albuterol/Atrovent nebulizer","Xopenex/Atrovent nebulizer","Albuterol nebulizer","Albuterol four puffs via MDI","Xopenex four puffs via MDI"} treatment given in clinic with {Blank single:19197::"significant improvement in FEV1 per ATS criteria","significant improvement in FVC per ATS criteria","significant improvement in FEV1 and FVC per ATS criteria","improvement in FEV1, but not significant per ATS criteria","improvement in FVC, but not significant per ATS criteria","improvement in FEV1 and FVC, but not significant per ATS criteria","no improvement"}.  Allergy Studies: {Blank single:19197::"none","deferred due to recent antihistamine use","deferred due  to insurance stipulations that require a separate visit for testing","labs sent instead"," "}    {Blank single:19197::"Allergy testing results were read and  interpreted by myself, documented by clinical staff."," "}      Malachi Bonds, MD  [facility]

## 2023-12-02 NOTE — Patient Instructions (Addendum)
1. Asthma-COPD overlap syndrome with acute exacerbation - Lung testing not done since you were having such a hard time breathing.  - We gave you two neb treatments with improvement in your symptoms. - We did give you a DepoMedrol injection. - Start the prednisone taper provided.  - Start the cough medicine 5 mL as needed (paper script provided). - We are not going to make any medication changes today.  - Be sure to fill out that form when you get it in the mail.  - Daily controller medication(s): Fasenra 30mg  every 8 weeks and Breztri two puffs twice daily with spacer - Prior to physical activity: albuterol 2 puffs 10-15 minutes before physical activity. - Rescue medications: albuterol 4 puffs every 4-6 hours as needed - Asthma control goals:  * Full participation in all desired activities (may need albuterol before activity) * Albuterol use two time or less a week on average (not counting use with activity) * Cough interfering with sleep two time or less a month * Oral steroids no more than once a year * No hospitalizations  2. Perennial allergic rhinitis - with overlying sinusitis - Continue with Allegra one tablet daily (you can INCREASE to twice daily)  - Continue with Flonase one spray per nostril daily.  - Continue with nasal saline rinses as needed. - Start Augmetin one tablet twice daily for ten days.  - Call us Monday with an update.   3. Return in about 2 months (around 02/02/2024). You can have the follow up appointment with Dr. Dellis Anes or a Nurse Practicioner (our Nurse Practitioners are excellent and always have Physician oversight!).    Please inform us of any Emergency Department visits, hospitalizations, or changes in symptoms. Call us before going to the ED for breathing or allergy symptoms since we might be able to fit you in for a sick visit. Feel free to contact us anytime with any questions, problems, or concerns.  It was a pleasure to see you again today! FEEL  BETTER!   Websites that have reliable patient information: 1. American Academy of Asthma, Allergy, and Immunology: www.aaaai.org 2. Food Allergy Research and Education (FARE): foodallergy.org 3. Mothers of Asthmatics: http://www.asthmacommunitynetwork.org 4. American College of Allergy, Asthma, and Immunology: www.acaai.org      "Like" [place] on Facebook and Instagram for our latest updates!      A healthy democracy works best when [employer] participate! Make sure you are registered to vote! If you have moved or changed any of your contact information, you will need to get this updated before voting! Scan the QR codes below to learn more!

## 2023-12-05 ENCOUNTER — Telehealth: Payer: Self-pay

## 2023-12-05 NOTE — Telephone Encounter (Signed)
FANTASTIC to hear that! Thanks, Shelburn!

## 2023-12-05 NOTE — Telephone Encounter (Signed)
Mrs. Debra [name] called stating she feels much better.

## 2023-12-13 ENCOUNTER — Emergency Department (HOSPITAL_COMMUNITY)
Admission: EM | Admit: 2023-12-13 | Discharge: 2023-12-13 | Disposition: A | Discharge status: Home / Self Care | Payer: Medicare PPO | Attending: Emergency Medicine | Admitting: Emergency Medicine

## 2023-12-13 ENCOUNTER — Encounter (HOSPITAL_COMMUNITY): Payer: Self-pay

## 2023-12-13 ENCOUNTER — Emergency Department (HOSPITAL_COMMUNITY): Payer: Medicare PPO

## 2023-12-13 ENCOUNTER — Other Ambulatory Visit: Payer: Self-pay

## 2023-12-13 DIAGNOSIS — Z7982 Long term (current) use of aspirin: Secondary | ICD-10-CM | POA: Diagnosis not present

## 2023-12-13 DIAGNOSIS — N189 Chronic kidney disease, unspecified: Secondary | ICD-10-CM | POA: Diagnosis not present

## 2023-12-13 DIAGNOSIS — D72829 Elevated white blood cell count, unspecified: Secondary | ICD-10-CM | POA: Insufficient documentation

## 2023-12-13 DIAGNOSIS — R519 Headache, unspecified: Secondary | ICD-10-CM | POA: Diagnosis present

## 2023-12-13 DIAGNOSIS — U071 COVID-19: Secondary | ICD-10-CM | POA: Insufficient documentation

## 2023-12-13 DIAGNOSIS — Z79899 Other long term (current) drug therapy: Secondary | ICD-10-CM | POA: Insufficient documentation

## 2023-12-13 DIAGNOSIS — E1122 Type 2 diabetes mellitus with diabetic chronic kidney disease: Secondary | ICD-10-CM | POA: Diagnosis not present

## 2023-12-13 DIAGNOSIS — J449 Chronic obstructive pulmonary disease, unspecified: Secondary | ICD-10-CM | POA: Insufficient documentation

## 2023-12-13 LAB — CBC WITH DIFFERENTIAL/PLATELET
Abs Immature Granulocytes: 0.26 10*3/uL — ABNORMAL HIGH (ref 0.00–0.07)
Basophils Absolute: 0.1 10*3/uL (ref 0.0–0.1)
Basophils Relative: 0 %
Eosinophils Absolute: 0 10*3/uL (ref 0.0–0.5)
Eosinophils Relative: 0 %
HCT: 35.1 % — ABNORMAL LOW (ref 36.0–46.0)
Hemoglobin: 11.7 g/dL — ABNORMAL LOW (ref 12.0–15.0)
Immature Granulocytes: 2 %
Lymphocytes Relative: 24 %
Lymphs Abs: 3.2 10*3/uL (ref 0.7–4.0)
MCH: 29.7 pg (ref 26.0–34.0)
MCHC: 33.3 g/dL (ref 30.0–36.0)
MCV: 89.1 fL (ref 80.0–100.0)
Monocytes Absolute: 1 10*3/uL (ref 0.1–1.0)
Monocytes Relative: 7 %
Neutro Abs: 9.1 10*3/uL — ABNORMAL HIGH (ref 1.7–7.7)
Neutrophils Relative %: 67 %
Platelets: 278 10*3/uL (ref 150–400)
RBC: 3.94 MIL/uL (ref 3.87–5.11)
RDW: 14.6 % (ref 11.5–15.5)
WBC: 13.6 10*3/uL — ABNORMAL HIGH (ref 4.0–10.5)
nRBC: 0 % (ref 0.0–0.2)

## 2023-12-13 LAB — COMPREHENSIVE METABOLIC PANEL
ALT: 17 U/L (ref 0–44)
AST: 17 U/L (ref 15–41)
Albumin: 3.6 g/dL (ref 3.5–5.0)
Alkaline Phosphatase: 52 U/L (ref 38–126)
Anion gap: 8 (ref 5–15)
BUN: 35 mg/dL — ABNORMAL HIGH (ref 8–23)
CO2: 26 mmol/L (ref 22–32)
Calcium: 9.2 mg/dL (ref 8.9–10.3)
Chloride: 104 mmol/L (ref 98–111)
Creatinine, Ser: 1.4 mg/dL — ABNORMAL HIGH (ref 0.44–1.00)
GFR, Estimated: 39 mL/min — ABNORMAL LOW (ref >60)
Glucose, Bld: 86 mg/dL (ref 70–99)
Potassium: 4.3 mmol/L (ref 3.5–5.1)
Sodium: 138 mmol/L (ref 135–145)
Total Bilirubin: 0.8 mg/dL (ref <1.2)
Total Protein: 6.7 g/dL (ref 6.5–8.1)

## 2023-12-13 LAB — RESP PANEL BY RT-PCR (RSV, FLU A&B, COVID)  RVPGX2
Influenza A by PCR: NEGATIVE
Influenza B by PCR: NEGATIVE
Resp Syncytial Virus by PCR: NEGATIVE
SARS Coronavirus 2 by RT PCR: POSITIVE — AB

## 2023-12-13 MED ORDER — PAXLOVID (150/100) 10 X 150 MG & 10 X 100MG PO TBPK
2.0000 | ORAL_TABLET | Freq: Two times a day (BID) | ORAL | 0 refills | Status: AC
Start: 2023-12-13 — End: 2023-12-18

## 2023-12-13 NOTE — ED Triage Notes (Signed)
Pt reports:  Headache Daily at baseline Worsening today Congestion Off/on since end of Nov Took ABX Helped at first  Now not helping Cough With sputum

## 2023-12-13 NOTE — ED Provider Notes (Signed)
EMERGENCY DEPARTMENT AT [facility] Provider Note   CSN: [id number] Arrival date & time: 12/13/23  1200     History  Chief Complaint  Patient presents with   Headache   Nasal Congestion   Cough    Debra [name] is a 77 y.o. female with history of COPD, CKD, diabetes, presents with 3 days of malaise, sore throat, productive cough, and bodyaches.  Denies any shortness of breath.  Denies any nausea, vomiting, or diarrhea.  Denies any fever or chills at home.   Headache Associated symptoms: cough   Cough Associated symptoms: headaches        Home Medications Prior to Admission medications   Medication Sig Start Date End Date Taking? Authorizing Provider  nirmatrelvir/ritonavir, renal dosing, (PAXLOVID, 150/100,) 10 x 150 MG & 10 x 100MG  TBPK Take 2 tablets by mouth 2 (two) times daily for 5 days. Dosage for moderate renal impairment (eGFR >/= 30 to <60 mL/min): 150 mg nirmatrelvir (one 150 mg tablet) with 100 mg ritonavir (one 100 mg tablet), with both tablets taken together twice daily for 5 days. Not recommended if eGFR < 30 mL/min.  PAXLOVID is not recommend in patients with severe hepatic impairment (Child-Pugh Class C). 12/13/23 12/18/23 Yes Arabella Merles, PA-C  acetaminophen (TYLENOL) 325 MG tablet Take 650 mg by mouth every 6 (six) hours as needed for moderate pain.    [provider]  albuterol (PROVENTIL) (2.5 MG/3ML) 0.083% nebulizer solution Take 2.5 mg by nebulization every 6 (six) hours as needed. 07/23/23   [provider]  amLODipine (NORVASC) 5 MG tablet Take 5 mg by mouth daily.    [provider]  aspirin 81 MG tablet Take 81 mg by mouth daily.    [provider]  atorvastatin (LIPITOR) 80 MG tablet Take 80 mg by mouth daily.    [provider]  Azelastine-Fluticasone (DYMISTA) 137-50 MCG/ACT SUSP Place 1 spray into both nostrils 2 (two) times daily.    [provider]  baclofen  (LIORESAL) 10 MG tablet 1 tablet as needed Orally Twice a day for 10 days As needed, take when at home only 05/27/23   [provider]  Benralizumab (FASENRA) 30 MG/ML SOSY Inject 1 mL (30 mg total) into the skin every 8 (eight) weeks. 01/25/23   Alfonse Spruce, MD  BREZTRI AEROSPHERE 160-9-4.8 MCG/ACT AERO Inhale 2 puffs into the lungs 2 (two) times daily. 11/02/23   Alfonse Spruce, MD  calcitRIOL (ROCALTROL) 0.25 MCG capsule Take 0.25 mcg by mouth 2 (two) times a week.    [provider]  Calcium Carb-Cholecalciferol (CALCIUM 600+D3 PO) Take 1 tablet by mouth daily.    [provider]  ciclopirox (LOPROX) 0.77 % cream Apply 1 Application topically 2 (two) times daily as needed (irritation on feet.). Apply to toenails, and feet 07/07/22   [provider]  citalopram (CELEXA) 20 MG tablet Take 20 mg by mouth daily. 10/06/21   [provider]  diclofenac Sodium (VOLTAREN) 1 % GEL Apply 2 g topically 4 (four) times daily as needed (pain).    [provider]  fexofenadine (ALLEGRA) 180 MG tablet Take 1 tablet (180 mg total) by mouth 2 (two) times daily as needed for allergies or rhinitis. 04/18/23   Alfonse Spruce, MD  fluticasone [facility]) 50 MCG/ACT nasal spray Place 1 spray into both nostrils daily as needed for allergies.    [provider]  furosemide (LASIX) 20 MG tablet Take 20 mg  by mouth daily as needed for edema. 07/12/21   [provider]  gabapentin (NEURONTIN) 400 MG capsule Take 400 mg by mouth 3 (three) times daily. 07/22/21   [provider]  hydrochlorothiazide (HYDRODIURIL) 12.5 MG tablet Take 12.5 mg by mouth daily. 07/24/19   [provider]  ipratropium (ATROVENT) 0.06 % nasal spray Place 1 spray into both nostrils daily as needed for rhinitis.    [provider]  ipratropium-albuterol (DUONEB) 0.5-2.5 (3) MG/3ML SOLN Inhale 3 mLs into the lungs every 4 (four) hours as needed  (SOB/Wheezing). 06/20/18   [provider]  lisinopril (ZESTRIL) 10 MG tablet 1 tablet Orally every day    [provider]  loratadine (CLARITIN) 10 MG tablet Take 10 mg by mouth daily as needed for allergies.    [provider]  meloxicam (MOBIC) 7.5 MG tablet 1 tablet Orally Once a day    [provider]  metoprolol (LOPRESSOR) 100 MG tablet Take 100 mg by mouth 2 (two) times daily. 03/13/17   [provider]  montelukast (SINGULAIR) 10 MG tablet TAKE 1 TABLET BY MOUTH EVERY DAY 10/31/23   Alfonse Spruce, MD  MUCINEX 600 MG 12 hr tablet 1 tablet as needed Orally every 12 hrs for 2 days 11/28/23   [provider]  nystatin (MYCOSTATIN) 100000 UNIT/ML suspension PLEASE SEE ATTACHED FOR DETAILED DIRECTIONS    [provider]  olmesartan (BENICAR) 20 MG tablet TAKE 1 TABLET BY MOUTH EVERY DAY Patient taking differently: Take 20 mg by mouth daily. 05/06/22   Nyoka Cowden, MD  OXYGEN Inhale 2 L into the lungs as needed.    [provider]  predniSONE (DELTASONE) 10 MG tablet Take two tablets (20mg ) twice daily for three days, then one tablet (10mg ) twice daily for three days, then STOP. 12/02/23   Alfonse Spruce, MD  promethazine-dextromethorphan (PROMETHAZINE-DM) 6.25-15 MG/5ML syrup Take 5 mLs by mouth 4 (four) times daily as needed for cough. 12/02/23   Alfonse Spruce, MD  Vitamin D, Ergocalciferol, (DRISDOL) 1.25 MG (50000 UT) CAPS capsule Take 50,000 Units by mouth every 30 (thirty) days. 05/03/19   [provider]      Allergies    Patient has no known allergies.    Review of Systems   Review of Systems  Respiratory:  Positive for cough.   Neurological:  Positive for headaches.    Physical Exam Updated Vital Signs BP 133/70   Pulse 96   Temp 98 F (36.7 C) (Oral)   Resp 20   Ht 5\' 2"  (1.575 m)   Wt 77.1 kg   SpO2 96%   BMI 31.09 kg/m  Physical Exam Vitals and nursing note  reviewed.  Constitutional:      General: She is not in acute distress.    Appearance: She is well-developed.     Comments: Well appearing, talking in full sentence on room air  HENT:     Head: Normocephalic and atraumatic.  Eyes:     Conjunctiva/sclera: Conjunctivae normal.  Cardiovascular:     Rate and Rhythm: Normal rate and regular rhythm.     Heart sounds: No murmur heard. Pulmonary:     Effort: Pulmonary effort is normal. No respiratory distress.     Comments: Mild rhonchi in lower lobes bilaterally Abdominal:     Palpations: Abdomen is soft.     Tenderness: There is no abdominal tenderness.  Musculoskeletal:        General: No swelling.  Cervical back: Neck supple.  Skin:    General: Skin is warm and dry.     Capillary Refill: Capillary refill takes less than 2 seconds.  Neurological:     Mental Status: She is alert.  Psychiatric:        Mood and Affect: Mood normal.     ED Results / Procedures / Treatments   Labs (all labs ordered are listed, but only abnormal results are displayed) Labs Reviewed  RESP PANEL BY RT-PCR (RSV, FLU A&B, COVID)  RVPGX2 - Abnormal; Notable for the following components:      Result Value   SARS Coronavirus 2 by RT PCR POSITIVE (*)    All other components within normal limits  COMPREHENSIVE METABOLIC PANEL - Abnormal; Notable for the following components:   BUN 35 (*)    Creatinine, Ser 1.40 (*)    GFR, Estimated 39 (*)    All other components within normal limits  CBC WITH DIFFERENTIAL/PLATELET - Abnormal; Notable for the following components:   WBC 13.6 (*)    Hemoglobin 11.7 (*)    HCT 35.1 (*)    Neutro Abs 9.1 (*)    Abs Immature Granulocytes 0.26 (*)    All other components within normal limits  CBC WITH DIFFERENTIAL/PLATELET    EKG None  Radiology DG Chest 2 View Result Date: 12/13/2023 CLINICAL DATA:  Cough and congestion. EXAM: CHEST - 2 VIEW COMPARISON:  August 22, 2023. FINDINGS: Stable cardiomegaly.  Sternotomy wires are noted. Both lungs are clear. The visualized skeletal structures are unremarkable. IMPRESSION: No active cardiopulmonary disease. Electronically Signed   By: Lupita Raider M.D.   On: 12/13/2023 13:29    Procedures Procedures    Medications Ordered in ED Medications - No data to display  ED Course/ Medical Decision Making/ A&P                                 Medical Decision Making Amount and/or Complexity of Data Reviewed Labs: ordered. Radiology: ordered.  Risk Prescription drug management.     Differential diagnosis includes but is not limited to COVID, flu, RSV, viral URI, strep pharyngitis, viral pharyngitis, allergic rhinitis, pneumonia, bronchitis   ED Course:  Patient well-appearing, stable vital signs.  Talking comfortably on room air.  She tested positive for COVID today.  Flu and RSV negative.  Her chest x-ray is without any acute findings, no concern for pneumonia. Patient medications checked and are ok to take with Paxlovid.  Patient GFR today is 24, will prescribe renal dosing for Paxlovid. Patient stable and does not require admission at this time. Appropriate for discharge home.    Impression: Covid 19  Disposition:  The patient was discharged home with instructions to take Paxlovid as prescribed. Flonase for nasal congestion. Tylenol for pain. Keep well hydrated. Use basic precautions such as mask wearing and hand washing to prevent spread. Return precautions given.  Lab Tests: I Ordered, and personally interpreted labs.  The pertinent results include:   CBC with leukocytosis of 13.6 CMP with creatinine 1.4, patient baseline Covid positive. Flu and RSV negative  Imaging Studies ordered: I ordered imaging studies including chest x-ray I independently visualized the imaging with scope of interpretation limited to determining acute life threatening conditions related to emergency care. Imaging showed no consolidations I agree with  the radiologist interpretation  Final Clinical Impression(s) / ED Diagnoses Final diagnoses:  COVID-19    Rx / DC Orders ED Discharge Orders          Ordered    nirmatrelvir/ritonavir, renal dosing, (PAXLOVID, 150/100,) 10 x 150 MG & 10 x 100MG  TBPK  2 times daily        12/13/23 1547              Arabella Merles, PA-C 12/13/23 1555    Lonell Grandchild, MD 12/13/23 2245

## 2023-12-13 NOTE — Discharge Instructions (Addendum)
You tested positive for COVID today.  Your flu, and RSV test were negative today   We do not have Paxlovid (the antiviral medication for covid) here. This has been sent to your pharmacy which closes at 6pm today. Please go directly to pick up this medication and start it as prescribed.   Home care instructions:  You can take Tylenol and/or Ibuprofen as directed on the packaging for fever reduction and pain relief.    For cough: honey 1/2 to 1 teaspoon (you can dilute the honey in water or another fluid).  You can also use guaifenesin and dextromethorphan for cough which are over-the-counter medications. You can use a humidifier for chest congestion and cough.  If you don't have a humidifier, you can sit in the bathroom with the hot shower running.      For sore throat: try warm salt water gargles, cepacol lozenges, throat spray, warm tea or water with lemon/honey, popsicles or ice, or OTC cold relief medicine for throat discomfort.    For congestion: Flonase 1-2 sprays in each nostril daily.    It is important to stay hydrated: drink plenty of fluids (water, gatorade/powerade/pedialyte, juices, or teas) to keep your throat moisturized and help further relieve irritation/discomfort.   Your illness is contagious and can be spread to others, especially during the first 5 days. Take basic precautions such as washing your hands often, covering your mouth when you cough or sneeze, and avoiding public places where you could spread your illness to others.   Please continue drinking plenty of fluids.  Use over-the-counter medicines as needed as directed on packaging for symptom relief.  You may also use ibuprofen or tylenol as directed on packaging for pain or fever.  Do not take multiple medicines containing Tylenol or acetaminophen to avoid taking too much of this medication.  Follow-up instructions: Please follow-up with your primary care provider in the next 3 days for further evaluation of your  symptoms if you are not feeling better.   Return instructions:  Please return to the Emergency Department if you experience worsening symptoms.  RETURN IMMEDIATELY IF you develop shortness of breath, confusion or altered mental status, a new rash, become dizzy, faint, or poorly responsive, or are unable to be cared for at home. Please return if you have persistent vomiting and cannot keep down fluids or develop a fever that is not controlled by tylenol or motrin.   Please return if you have any other emergent concerns.

## 2023-12-22 ENCOUNTER — Other Ambulatory Visit: Payer: Self-pay | Admitting: *Deleted

## 2023-12-22 MED ORDER — FASENRA 30 MG/ML ~~LOC~~ SOSY: 30.0000 mg | PREFILLED_SYRINGE | SUBCUTANEOUS | 6 refills | Status: DC

## 2024-01-16 ENCOUNTER — Ambulatory Visit (INDEPENDENT_AMBULATORY_CARE_PROVIDER_SITE_OTHER): Payer: Medicare PPO

## 2024-01-16 DIAGNOSIS — J4550 Severe persistent asthma, uncomplicated: Secondary | ICD-10-CM

## 2024-01-16 NOTE — Telephone Encounter (Signed)
Patient came in to the office and stated she is suppose to be switching to Dupixent and is requesting an update. Tammy can you please reach out to patient regarding an update. Thank you.

## 2024-01-24 NOTE — Progress Notes (Signed)
 Debra [name], female    DOB: 1946/09/20   MRN: [id number]   Brief patient profile:  32   yobf quit smoking 2013 with onset of rhinitis/ asthma in her 40s better since rx fosenra from [facility] Spring 2021 prev under care of [place] referred to pulmonary clinic in [place]  06/11/2021 by [facility]    History of Present Illness  06/11/2021  Pulmonary/ 1st office eval/ Yordan [place] / [place] [facility]  maint on breztri  2 bid / acei and prednisone  per arthritis doctor/ fasenra  per Dr Iva Chief Complaint  Patient presents with   Consult    Patient reports she was diagnosed with COPD 7 years ago, 04/2012. She reports she may take a while to get some sleep.    Dyspnea:  limited by knees > sob/ able to weed eat and do most yard work  Cough: none  Sleep: sleep flat bed/ one pillow  SABA use: last used 6/20 when got hot assoc with subjective wheeze   Still on prednisone  per Dr Elaine for arthritis  Rec Plan A = Automatic = Always=    breztri  Take 2 puffs first thing in am and then another 2 puffs about 12 hours later.   Work on inhaler technique:   Plan B = Backup (to supplement plan A, not to replace it) Only use your albuterol  inhaler as a rescue medication  Plan C = Crisis (instead of Plan B but only if Plan B stops working) - only use your albuterol  nebulizer if you first try Plan B   Stop lisinopril and replace with olmesartan  20 mg one daily and your wheezing should improve and let your primary doctor know    Spirometry 10/04/23 restrictive with min curvature to f/v loop    10/25/2023  f/u ov/[place] office/Georgene Kopper re: asthma maint on singulair  breztri /fasenra  high dose lopressor  Chief Complaint  Patient presents with   Asthma-COPD overlap syndrome  Dyspnea:  walking at [employer] at least once or twice a week pushing cart s 02 /uses [facility] parking sometimes  Cough: resolved  Sleeping: flat bed one pillow s   resp cc  SABA use: rarely, only uses neb when gets sick   02: just started in Aug 2024 2lpm hs and prn but not titrating daytime   Lung cancer screening: deferred to PCP Rec Work on inhaler technique:   Make sure you check your oxygen  saturation  AT  your highest level of activity (not after you stop)   to be sure it stays over 90%  My office will be contacting you by phone for referral for PFTs   You are eligible for a free low dose CT chest until 2028 > defer to PCP  You should consider changing lopressor  100  to Bisoprolol 10 mg twice daily  > defer PCP   Please schedule a follow up visit in 3 months but call sooner if needed  with all medications /inhalers/ solutions in hand    01/25/2024  f/u ov/ office/Renelda Kilian re: asthma maint on breztri / montelukast   did not bring meds  Last prednisone  3 weeks prior to OV  per Iva p covid  Chief Complaint  Patient presents with   Follow-up    Follow up  copd   Dyspnea:  doing [employer] sho[employer]ping pushing cart/ inclines are a problem  Cough: left over from covid but improving  Sleeping: flat bed/ one pillow    resp cc  SABA use: rarely needs but has hfa and neb   02  2lpm hs / prn   No obvious day to day or daytime variability or assoc excess/ purulent sputum or mucus plugs or hemoptysis or cp or chest tightness, subjective wheeze or overt sinus or hb symptoms.    Also denies any obvious fluctuation of symptoms with weather or environmental changes or other aggravating or alleviating factors except as outlined above   No unusual exposure hx or h/o childhood pna/ asthma or knowledge of premature birth.  Current Allergies, Complete Past Medical History, Past Surgical History, Family History, and Social History were reviewed in [employer] record.  ROS  The following are not active complaints unless bolded Hoarseness, sore throat, dysphagia, dental problems, itching, sneezing,  nasal congestion or discharge of excess mucus or purulent secretions, ear ache,   fever,  chills, sweats, unintended wt loss or wt gain, classically pleuritic or exertional cp,  orthopnea pnd or arm/hand swelling  or leg swelling, presyncope, palpitations, abdominal pain, anorexia, nausea, vomiting, diarrhea  or change in bowel habits or change in bladder habits, change in stools or change in urine, dysuria, hematuria,  rash, arthralgias, visual complaints, headache, numbness, weakness or ataxia or problems with walking or coordination,  change in mood or  memory.        Current Meds  Medication Sig   acetaminophen  (TYLENOL ) 325 MG tablet Take 650 mg by mouth every 6 (six) hours as needed for moderate pain.   albuterol  (PROVENTIL ) (2.5 MG/3ML) 0.083% nebulizer solution Take 2.5 mg by nebulization every 6 (six) hours as needed.   amLODipine  (NORVASC ) 5 MG tablet Take 5 mg by mouth daily.   aspirin  81 MG tablet Take 81 mg by mouth daily.   atorvastatin  (LIPITOR) 80 MG tablet Take 80 mg by mouth daily.   Azelastine -Fluticasone  (DYMISTA ) 137-50 MCG/ACT SUSP Place 1 spray into both nostrils 2 (two) times daily.   baclofen (LIORESAL) 10 MG tablet 1 tablet as needed Orally Twice a day for 10 days As needed, take when at home only   benralizumab  (FASENRA ) 30 MG/ML prefilled syringe Inject 1 mL (30 mg total) into the skin every 8 (eight) weeks.   BREZTRI  AEROSPHERE 160-9-4.8 MCG/ACT AERO Inhale 2 puffs into the lungs 2 (two) times daily.   calcitRIOL (ROCALTROL) 0.25 MCG capsule Take 0.25 mcg by mouth 2 (two) times a week.   Calcium  Carb-Cholecalciferol (CALCIUM  600+D3 PO) Take 1 tablet by mouth daily.   ciclopirox (LOPROX) 0.77 % cream Apply 1 Application topically 2 (two) times daily as needed (irritation on feet.). Apply to toenails, and feet   citalopram  (CELEXA ) 20 MG tablet Take 20 mg by mouth daily.   diclofenac Sodium (VOLTAREN) 1 % GEL Apply 2 g topically 4 (four) times daily as needed (pain).   fluticasone  (FLONASE ) 50 MCG/ACT nasal spray Place 1 spray into both nostrils daily as  needed for allergies.   furosemide  (LASIX ) 20 MG tablet Take 20 mg by mouth daily as needed for edema.   gabapentin  (NEURONTIN ) 400 MG capsule Take 400 mg by mouth 3 (three) times daily.   hydrochlorothiazide  (HYDRODIURIL ) 12.5 MG tablet Take 12.5 mg by mouth daily.   ipratropium (ATROVENT) 0.06 % nasal spray Place 1 spray into both nostrils daily as needed for rhinitis.   ipratropium-albuterol  (DUONEB) 0.5-2.5 (3) MG/3ML SOLN Inhale 3 mLs into the lungs every 4 (four) hours as needed (SOB/Wheezing).   loratadine (CLARITIN) 10 MG tablet Take 10 mg by mouth daily as needed for allergies.   meloxicam (MOBIC) 7.5 MG tablet 1  tablet Orally Once a day   metoprolol  (LOPRESSOR ) 100 MG tablet Take 100 mg by mouth 2 (two) times daily.   montelukast  (SINGULAIR ) 10 MG tablet TAKE 1 TABLET BY MOUTH EVERY DAY   MUCINEX  600 MG 12 hr tablet 1 tablet as needed Orally every 12 hrs for 2 days   nystatin (MYCOSTATIN) 100000 UNIT/ML suspension PLEASE SEE ATTACHED FOR DETAILED DIRECTIONS   olmesartan  (BENICAR ) 20 MG tablet TAKE 1 TABLET BY MOUTH EVERY DAY (Patient taking differently: Take 20 mg by mouth daily.)   OXYGEN  Inhale 2 L into the lungs as needed.   Vitamin D, Ergocalciferol, (DRISDOL) 1.25 MG (50000 UT) CAPS capsule Take 50,000 Units by mouth every 30 (thirty) days.   Current Facility-Administered Medications for the 01/25/24 encounter (Office Visit) with Darlean Ozell NOVAK, MD  Medication   Benralizumab  SOSY 30 mg             Past Medical History:  Diagnosis Date   Allergic rhinitis    Asthma    COPD (chronic obstructive pulmonary disease) (HCC)    Diabetes mellitus without complication (HCC)    Hyperlipidemia    Hypertension    Recurrent upper respiratory infection (URI)       Objective:    Wts  01/25/2024         165  10/25/2023       166  10/15/2022     169  06/11/2022       176   06/11/21 182 lb (82.6 kg)  03/25/21 188 lb 3.2 oz (85.4 kg)  12/03/20 182 lb 3.2 oz (82.6 kg)    Vital  signs reviewed  01/25/2024  - Note at rest 02 sats  92% on RA   General appearance:    amb cushingnoid bf nad    HEENT : Oropharynx  clear   Nasal turbinates nl   NECK :  without  apparent JVD/ palpable Nodes/TM    LUNGS: no acc muscle use,  Min barrel  contour chest wall with bilateral  slightly decreased bs s audible wheeze and  without cough on insp or exp maneuvers and min  Hyperresonant  to  percussion bilaterally    CV:  RRR  no s3 or murmur or increase in P2, and no edema   ABD:  tensely obese soft and nontender     MS:  Nl gait/ ext warm without deformities Or obvious joint restrictions  calf tenderness, cyanosis or clubbing     SKIN: warm and dry without lesions    NEURO:  alert, approp, nl sensorium with  no motor or cerebellar deficits apparent.          I personally reviewed images and agree with radiology impression as follows:  CXR:   pa and lateral 12/13/23  No active dz       Assessment

## 2024-01-25 ENCOUNTER — Encounter: Payer: Self-pay | Admitting: Internal Medicine

## 2024-01-25 ENCOUNTER — Ambulatory Visit: Payer: Medicare PPO | Admitting: Internal Medicine

## 2024-01-25 VITALS — BP 131/69 | HR 58 | Ht 62.0 in | Wt 165.6 lb

## 2024-01-25 DIAGNOSIS — J9611 Chronic respiratory failure with hypoxia: Secondary | ICD-10-CM

## 2024-01-25 DIAGNOSIS — J4489 Other specified chronic obstructive pulmonary disease: Secondary | ICD-10-CM | POA: Diagnosis not present

## 2024-01-25 NOTE — Assessment & Plan Note (Signed)
 HC03   08/22/23   = 32  - placed on 22 Jul 2023 by [facility] doc - 10/25/2023   Walked on RA  x  3  lap(s) =  approx 450  ft  @ mod pace, stopped due to end of study  with lowest 02 sats 90% s sob  so rec titrate walking to keep > 90% and 2lpm   Just using Hs at 2lpm for now   Advised: Make sure you check your oxygen  saturation  AT  your highest level of activity (not after you stop)   to be sure it stays over 90% and adjust  02 flow upward to maintain this level if needed but remember to turn it back to previous settings when you stop (to conserve your supply).          Each maintenance medication was reviewed in detail including emphasizing most importantly the difference between maintenance and prns and under what circumstances the prns are to be triggered using an action plan format where appropriate.  Total time for H and P, chart review, counseling, reviewing hfa/ neb/ spacer/ 02 /pulse ox  device(s) and generating customized AVS unique to this office visit / same day charting = 33 min

## 2024-01-25 NOTE — Assessment & Plan Note (Signed)
 Quit smoking 2013 with multiple spirometries showing min obsruction/ mostly restriction  - [facility] rx spring 2021 [facility] - Daily pred per arthritic doctor as of 1st pulmonary eval 06/11/2021 with [place] hump noted - 06/11/2021 rec trial off ACEi   - 06/11/2022  After extensive coaching inhaler device,  effectiveness =    25% with hfa  (late insp, very short Ti)  Spirometry 10/04/23 restrictive with min curvature to f/v loop  - 10/25/2023  After extensive coaching inhaler device,  effectiveness =    near 0 due to very short TI improved with empty breztri  divide to teach inspiratory technique  - 01/25/2024  After extensive coaching inhaler device,  effectiveness =    75% with spacer    Group D (now reclassified as E) in terms of symptom/risk and laba/lama/ICS  therefore appropriate rx at this point >>>  breztri  via spacer/ fasenra  > agree with plan to change to Dupixent  as has been approved for copd and blocks a different pathway   Advised on how to tell dupixent  is working ie doe/ need for saba or prednisone   Contingency planning reviewed esp re use of saba: Re SABA :  I spent extra time with pt today reviewing appropriate use of albuterol  for prn use on exertion with the following points: 1) saba is for relief of sob that does not improve by walking a slower pace or resting but rather if the pt does not improve after trying this first. 2) If the pt is convinced, as many are, that saba helps recover from activity faster then it's easy to tell if this is the case by re-challenging : ie stop, take the inhaler, then p 5 minutes try the exact same activity (intensity of workload) that just caused the symptoms and see if they are substantially diminished or not after saba 3) if there is an activity that reproducibly causes the symptoms, try the saba 15 min before the activity on alternate days   If in fact the saba really does help, then fine to continue to use it prn but advised may need to look  closer at the maintenance regimen being used to achieve better control of airways disease with exertion.   F/u can be q 6 m for now

## 2024-01-25 NOTE — Patient Instructions (Signed)
 Plan A = Automatic = Always=    breztri  Take 2 puffs first thing in am and then another 2 puffs about 12 hours later.    Work on inhaler technique:  relax and gently blow all the way out then take a nice smooth full deep breath back in, triggering the inhaler a split second before you start breathing in when using Spacer  Hold for up to 5 seconds if you can. Blow out thru nose. Rinse and gargle with water  when done.  If mouth or throat bother you at all,  try brushing teeth/gums/tongue with arm and hammer toothpaste/ make a slurry and gargle and spit out.       Plan B = Backup (to supplement plan A, not to replace it) Only use your albuterol  inhaler as a rescue medication to be used if you can't catch your breath by resting or doing a relaxed purse lip breathing pattern.  - The less you use it, the better it will work when you need it. - Ok to use the inhaler up to 2 puffs  every 4 hours if you must but call for appointment if use goes up over your usual need - Don't leave home without it !!  (think of it like the spare tire for your car)   Plan C = Crisis (instead of Plan B but only if Plan B stops working) - only use your albuterol  nebulizer if you first try Plan B and it fails to help > ok to use the nebulizer up to every 4 hours but if start needing it regularly call for immediate appointment    Please schedule a follow up visit in 6 months but call sooner if needed  - bring spacer arline

## 2024-01-26 NOTE — Telephone Encounter (Signed)
 Sent PAP application to patient and returned same submitted today

## 2024-02-23 ENCOUNTER — Telehealth: Payer: Self-pay | Admitting: *Deleted

## 2024-02-23 MED ORDER — DUPIXENT 300 MG/2ML ~~LOC~~ SOSY: 600.0000 mg | PREFILLED_SYRINGE | Freq: Once | SUBCUTANEOUS | 11 refills | Status: AC

## 2024-02-23 NOTE — Telephone Encounter (Signed)
 Spoke with Debra [name] who called the office to schedule a follow up visit with Dr. Pricilla Holm. Pt last saw Dr. Pricilla Holm on March 11, 2023 and has seen her gyn in September 2024. Pt was give first available on Friday, May 9th at 2:45. Pt agreed to date and time of appt. And had no further concerns or questions at this time.

## 2024-02-23 NOTE — Addendum Note (Signed)
 Addended by: Devoria Glassing on: 02/23/2024 04:28 PM   Modules accepted: Orders

## 2024-03-02 ENCOUNTER — Ambulatory Visit: Payer: Medicare PPO | Admitting: Allergy & Immunology

## 2024-03-02 ENCOUNTER — Encounter: Payer: Self-pay | Admitting: Allergy & Immunology

## 2024-03-02 VITALS — BP 130/60 | HR 59 | Temp 97.5°F | Resp 16 | Wt 167.4 lb

## 2024-03-02 DIAGNOSIS — K219 Gastro-esophageal reflux disease without esophagitis: Secondary | ICD-10-CM

## 2024-03-02 DIAGNOSIS — J3089 Other allergic rhinitis: Secondary | ICD-10-CM | POA: Diagnosis not present

## 2024-03-02 DIAGNOSIS — J4550 Severe persistent asthma, uncomplicated: Secondary | ICD-10-CM | POA: Diagnosis not present

## 2024-03-02 MED ORDER — OMEPRAZOLE 40 MG PO CPDR: 40.0000 mg | DELAYED_RELEASE_CAPSULE | Freq: Every day | ORAL | 1 refills | Status: DC

## 2024-03-02 NOTE — Patient Instructions (Addendum)
 1. Asthma-COPD overlap syndrome, uncomplicated - Lung testing looks a bit better than last time which is good news. - I talked to [facility] and she recommended the following: call and express hardship by calling [phone] and tell them she cannot afford her copay and she has signed up for PPP even if she did not - Daily controller medication(s): Fasenra 30mg  every 8 weeks and Breztri two puffs twice daily with spacer - Prior to physical activity: albuterol 2 puffs 10-15 minutes before physical activity. - Rescue medications: albuterol 4 puffs every 4-6 hours as needed - Asthma control goals:  * Full participation in all desired activities (may need albuterol before activity) * Albuterol use two time or less a week on average (not counting use with activity) * Cough interfering with sleep two time or less a month * Oral steroids no more than once a year * No hospitalizations  2. Perennial allergic rhinitis - Continue with Allegra one tablet daily (you can INCREASE to twice daily)  - Continue with Flonase one spray per nostril TWICE daily. - Continue with nasal saline rinses as needed.  3. GERD  - Start omeprazole 40mg  daily.   4. Return in about 3 months (around 06/02/2024). You can have the follow up appointment with Dr. Dellis Anes or a Nurse Practicioner (our Nurse Practitioners are excellent and always have Physician oversight!).    Please inform us of any Emergency Department visits, hospitalizations, or changes in symptoms. Call us before going to the ED for breathing or allergy symptoms since we might be able to fit you in for a sick visit. Feel free to contact us anytime with any questions, problems, or concerns.  It was a pleasure to see you again today! FEEL BETTER!   Websites that have reliable patient information: 1. American Academy of Asthma, Allergy, and Immunology: www.aaaai.org 2. Food Allergy Research and Education (FARE): foodallergy.org 3. Mothers of Asthmatics:  http://www.asthmacommunitynetwork.org 4. American College of Allergy, Asthma, and Immunology: www.acaai.org      "Like" [place] on Facebook and Instagram for our latest updates!      A healthy democracy works best when [employer] participate! Make sure you are registered to vote! If you have moved or changed any of your contact information, you will need to get this updated before voting! Scan the QR codes below to learn more!

## 2024-03-02 NOTE — Progress Notes (Signed)
 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 Asthma-COPD overlap syndrome, uncomplicated - Lung testing looks a bit better than last time which is good news. - I talked to [facility] and she recommended the following: call and express hardship by calling [phone] and tell them she cannot afford her copay and she has signed up for PPP even if she did not - Daily controller medication(s): Fasenra 30mg  every 8 weeks and Breztri two puffs twice daily with spacer - Prior to physical activity: albuterol 2 puffs 10-15 minutes before physical activity. - Rescue medications: albuterol 4 puffs every 4-6 hours as needed - Asthma control goals:  * Full participation in all desired activities (may need albuterol before activity) * Albuterol use two time or less a week on average (not counting use with activity) * Cough interfering with sleep two time or less a month * Oral steroids no more than once a year * No hospitalizations  2. Perennial allergic rhinitis - Continue with Allegra one tablet daily (you can INCREASE to twice daily)  - Continue with Flonase one spray per nostril TWICE daily. - Continue with nasal saline rinses as needed.  3. GERD  - Start omeprazole 40mg  daily.   4. Return in about 3 months (around 06/02/2024). You can have the follow up appointment with Dr. Dellis Anes or a Nurse Practicioner (our Nurse Practitioners are excellent and always have Physician oversight!).   Subjective:   Debra [name] is a 78 y.o. 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   Moderate persistent asthma, uncomplicated 10/25/2023   Coronary arteriosclerosis 10/25/2023   Shortness of breath 10/25/2023   Cylindrical bronchiectasis (HCC) 10/25/2023   Hypercholesteremia 10/25/2023   Chronic respiratory failure with hypoxia and hypercapnia (HCC) 10/25/2023   COPD with acute exacerbation (HCC) 12/11/2022   Depression 12/11/2022   Chronic kidney disease, stage 3b (HCC) 12/11/2022   Sepsis (HCC) 12/11/2022   CAP (community acquired pneumonia) 12/10/2022   Pelvic mass 07/27/2022   Ovarian cyst 03/08/2022   Essential hypertension 06/11/2021   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  Discussed the use of AI scribe software for clinical note transcription with the patient and/or guardian, who gave verbal consent to proceed.  Debra [name] is a 78 y.o. female presenting for a follow up visit.  She was last seen in December 2020.  At that time, we did not do lung testing as she was having a hard time breathing.  Depo-Medrol injection and started on prednisone.  We also started her on some cough medication 5 mL as needed.  We continue with Fasenra 30 mg subcutaneously as well as Breztri 2 puffs twice daily.  For her rhinitis, we continue with Allegra as well as Flonase.  We also started her on Augmentin.  Since the last visit, she has done well.   Asthma/Respiratory  Symptom History: She continues to use [place], which she receives through the [facility] program, and it is sent to her every three months. She is also on Fasenra, which she feels has been beneficial. She is awaiting a decision regarding a change in her Dupixent treatment. She experiences occasional wheezing and shortness of breath, which she associates with her asthma. She was diagnosed with COVID-19 after her last visit in December and experienced significant illness, which worsened before she sought further medical attention. She completed a course of Paxlovid. This is her third time experiencing a similar illness, having had norovirus previously.  She gets free drug through [place] [facility] [place]. She gets three months at a time.   Allergic Rhinitis Symptom History: She reports sinus drainage every morning and is currently using Flonase in the morning. She also uses a saline rinse regularly.  Skin Symptom History: She describes a rash on her hands, which worsens in cold weather. The rash is tender and rough, affecting both hands, though it is less visible at present.  GERD Symptom History: She experiences symptoms that may be related to GERD, which could be exacerbating her asthma. She is not currently on reflux medication. She is open to trying something.   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 Her husband has a history of smoking and experiences belching, which she attributes to acid reflux.   Otherwise, there have been no changes to her past medical history, surgical history, family history, or social history.    Review of systems otherwise negative other than that mentioned in the HPI.    Objective:   Blood pressure 130/60, pulse (!) 59, temperature (!) 97.5 F (36.4 C), resp. rate 16, weight 167 lb 6 oz (75.9 kg), SpO2 95%. Body mass index is 30.61 kg/m.    Physical Exam Vitals reviewed.  Constitutional:      Appearance:  She is well-developed.     Comments: Pleasant female.  Seems short of breath when she first enters the clinic.   HENT:     Head: Normocephalic and atraumatic.     Right Ear: Tympanic membrane, ear canal and external ear normal.     Left Ear: Tympanic membrane, ear canal and external ear normal.     Nose: No nasal deformity, septal deviation, mucosal edema or rhinorrhea.     Right Turbinates: Enlarged, swollen and pale.     Left Turbinates: Enlarged, swollen and pale.     Right Sinus: No maxillary sinus tenderness or frontal sinus tenderness.     Left Sinus: No maxillary sinus tenderness or frontal sinus tenderness.     Comments: Clear without rhinorrhea.     Mouth/Throat:     Mouth: Mucous membranes are not pale and not dry.     Pharynx: Uvula midline.  Eyes:     General: Lids are normal. No allergic shiner.       Right eye: No discharge.        Left eye: No discharge.     Conjunctiva/sclera: Conjunctivae normal.     Right eye: Right conjunctiva is not injected. No chemosis.  Left eye: Left conjunctiva is not injected. No chemosis.    Pupils: Pupils are equal, round, and reactive to light.  Cardiovascular:     Rate and Rhythm: Normal rate and regular rhythm.     Heart sounds: Normal heart sounds.  Pulmonary:     Effort: Pulmonary effort is normal. No tachypnea, accessory muscle usage or respiratory distress.     Breath sounds: Normal breath sounds. No wheezing, rhonchi or rales.     Comments: Diffuse wheezing in all lung fields. This improves with the nebulizer treatments.  Chest:     Chest wall: No tenderness.  Lymphadenopathy:     Cervical: No cervical adenopathy.  Skin:    General: Skin is warm.     Capillary Refill: Capillary refill takes less than 2 seconds.     Coloration: Skin is not pale.     Findings: No abrasion, erythema, petechiae or rash. Rash is not papular, urticarial or vesicular.  Neurological:     Mental Status: She is alert.  Psychiatric:         Behavior: Behavior is cooperative.      Diagnostic studies:    Spirometry: results abnormal (FEV1: 0.59/36%, FVC: 0.88/42%, FEV1/FVC: 67%).    Spirometry consistent with mixed obstructive and restrictive disease. This is slightly better than last time.   Allergy Studies: none     Malachi Bonds, MD  [facility]

## 2024-03-12 ENCOUNTER — Ambulatory Visit: Payer: Medicare PPO

## 2024-03-12 DIAGNOSIS — J4550 Severe persistent asthma, uncomplicated: Secondary | ICD-10-CM | POA: Diagnosis not present

## 2024-03-12 MED ORDER — DUPILUMAB 300 MG/2ML ~~LOC~~ SOSY
300.0000 mg | PREFILLED_SYRINGE | SUBCUTANEOUS | Status: AC
  Administered 2024-03-12 – 2025-01-07 (×21): 300 mg via SUBCUTANEOUS

## 2024-03-12 NOTE — Progress Notes (Signed)
 Immunotherapy   Patient Details  Name: Francetta Ilg MRN: [id number] Date of Birth: 1946/05/02  03/12/2024  Kadey Mihalic started injections for  Dupixent Following schedule: Every fourteen days.  Frequency:Every two weeks.  Epi-Pen:Not needed Consent signed in office today and patient instructions given. Patient sat in the lobby for fifteen minutes without an issue.    Ralene Muskrat 03/12/2024, 9:04 AM

## 2024-03-26 ENCOUNTER — Ambulatory Visit

## 2024-03-26 DIAGNOSIS — J4550 Severe persistent asthma, uncomplicated: Secondary | ICD-10-CM | POA: Diagnosis not present

## 2024-04-09 ENCOUNTER — Ambulatory Visit

## 2024-04-09 DIAGNOSIS — J4550 Severe persistent asthma, uncomplicated: Secondary | ICD-10-CM

## 2024-04-23 ENCOUNTER — Ambulatory Visit

## 2024-04-26 ENCOUNTER — Encounter: Payer: Self-pay | Admitting: Gynecologic Oncology

## 2024-04-27 ENCOUNTER — Inpatient Hospital Stay: Admitting: Gynecologic Oncology

## 2024-04-27 ENCOUNTER — Inpatient Hospital Stay: Attending: Gynecologic Oncology | Admitting: Gynecologic Oncology

## 2024-04-27 ENCOUNTER — Encounter: Payer: Self-pay | Admitting: Gynecologic Oncology

## 2024-04-27 VITALS — BP 132/58 | HR 58 | Temp 98.6°F | Resp 17 | Ht 62.0 in | Wt 163.6 lb

## 2024-04-27 DIAGNOSIS — R971 Elevated cancer antigen 125 [CA 125]: Secondary | ICD-10-CM | POA: Insufficient documentation

## 2024-04-27 DIAGNOSIS — Z9079 Acquired absence of other genital organ(s): Secondary | ICD-10-CM | POA: Diagnosis not present

## 2024-04-27 DIAGNOSIS — D3912 Neoplasm of uncertain behavior of left ovary: Secondary | ICD-10-CM

## 2024-04-27 DIAGNOSIS — Z90722 Acquired absence of ovaries, bilateral: Secondary | ICD-10-CM | POA: Insufficient documentation

## 2024-04-27 DIAGNOSIS — Z8603 Personal history of neoplasm of uncertain behavior: Secondary | ICD-10-CM

## 2024-04-27 NOTE — Progress Notes (Signed)
 Gynecologic Oncology Return Clinic Visit  04/27/24  Reason for Visit: surveillance   Treatment History: 06/17/22: CA-125 34.8, HE4 158. CEA 2.5. 07/21/22: CT A/P shows 4.6 cm complex cystic mass in right ovary. No lymphadenopathy or ascites. No metastatic disease.  07/27/22: Robotic BSO, LOA, staging including peritoneal biopsies and omental biopsy, cystoscopy   Interval History: Doing well.  Denies any vaginal bleeding.  Continues to have some intermittent vulvar symptoms, follows with Dr. Ozan for this.  Reports baseline bowel bladder function.  Denies any abdominal or pelvic pain.  Past Medical/Surgical History: Past Medical History:  Diagnosis Date   Allergic rhinitis    Arthritis    Asthma    Chronic kidney disease (CKD)    COPD (chronic obstructive pulmonary disease) (HCC)    Diabetes mellitus without complication (HCC)    Dyspnea    Hyperlipidemia    Hypertension    Hypertensive chronic kidney disease    Recurrent upper respiratory infection (URI)    Secondary hyperparathyroidism of renal origin (HCC)     Past Surgical History:  Procedure Laterality Date   CHOLECYSTECTOMY     CORONARY ARTERY BYPASS GRAFT     PARTIAL HYSTERECTOMY  1981   ovaries left in situ, was done for fibroids   ROBOTIC ASSISTED BILATERAL SALPINGO OOPHERECTOMY Bilateral 07/27/2022   Procedure: XI ROBOTIC ASSISTED BILATERAL SALPINGO OOPHORECTOMY STAGING,;  Surgeon: Suzi Essex, MD;  Location: [facility];  Service: Gynecology;  Laterality: Bilateral;    Family History  Problem Relation Age of Onset   Stroke Mother    Cancer Father    Prostate cancer Father    Alcohol abuse Sister    Heart attack Brother    Alcohol abuse Brother    Other Brother        MVA   Asthma Daughter    Miscarriages / [place] Daughter    Diabetes Son    Allergic rhinitis Neg Hx    Eczema Neg Hx    Immunodeficiency Neg Hx    Urticaria Neg Hx    Angioedema Neg Hx    Atopy Neg Hx     Social History    Socioeconomic History   Marital status: Married    Spouse name: Not on file   Number of children: Not on file   Years of education: Not on file   Highest education level: Not on file  Occupational History   Not on file  Tobacco Use   Smoking status: Former    Current packs/day: 0.00    Types: Cigarettes    Quit date: 05/19/2012    Years since quitting: 11.9   Smokeless tobacco: Never  Vaping Use   Vaping status: Never Used  Substance and Sexual Activity   Alcohol use: No    Alcohol/week: 0.0 standard drinks of alcohol   Drug use: No   Sexual activity: Not Currently    Birth control/protection: Surgical    Comment: partial hyst  Other Topics Concern   Not on file  Social History Narrative   Not on file   Social Drivers of Health   Financial Resource Strain: Low Risk  (04/08/2022)   Overall Financial Resource Strain (CARDIA)    Difficulty of Paying Living Expenses: Not very hard  Food Insecurity: No Food Insecurity (12/11/2022)   Hunger Vital Sign    Worried About Running Out of Food in the Last Year: Never true    Ran Out of Food in the Last Year: Never true  Transportation Needs: No Transportation  Needs (12/11/2022)   PRAPARE - Administrator, Civil Service (Medical): No    Lack of Transportation (Non-Medical): No  Physical Activity: Inactive (04/08/2022)   Exercise Vital Sign    Days of Exercise per Week: 0 days    Minutes of Exercise per Session: 0 min  Stress: No Stress Concern Present (04/08/2022)   [employer] of Occupational Health - Occupational Stress Questionnaire    Feeling of Stress : Not at all  Social Connections: Moderately Isolated (04/08/2022)   Social Connection and Isolation Panel [NHANES]    Frequency of Communication with Friends and Family: More than three times a week    Frequency of Social Gatherings with Friends and Family: Three times a week    Attends Religious Services: Never    Active Member of Clubs or Organizations:  No    Attends Banker Meetings: Never    Marital Status: Married    Current Medications:  Current Outpatient Medications:    acetaminophen  (TYLENOL ) 325 MG tablet, Take 650 mg by mouth every 6 (six) hours as needed for moderate pain., Disp: , Rfl:    albuterol  (PROVENTIL ) (2.5 MG/3ML) 0.083% nebulizer solution, Take 2.5 mg by nebulization every 6 (six) hours as needed., Disp: , Rfl:    amLODipine  (NORVASC ) 5 MG tablet, Take 5 mg by mouth daily., Disp: , Rfl:    aspirin  81 MG tablet, Take 81 mg by mouth daily., Disp: , Rfl:    atorvastatin  (LIPITOR) 80 MG tablet, Take 80 mg by mouth daily., Disp: , Rfl:    Azelastine -Fluticasone  (DYMISTA) 137-50 MCG/ACT SUSP, Place 1 spray into both nostrils 2 (two) times daily., Disp: , Rfl:    baclofen (LIORESAL) 10 MG tablet, 1 tablet as needed Orally Twice a day for 10 days As needed, take when at home only, Disp: , Rfl:    BREZTRI  AEROSPHERE 160-9-4.8 MCG/ACT AERO, Inhale 2 puffs into the lungs 2 (two) times daily., Disp: 32.1 g, Rfl: 3   calcitRIOL (ROCALTROL) 0.25 MCG capsule, Take 0.25 mcg by mouth 2 (two) times a week., Disp: , Rfl:    Calcium  Carb-Cholecalciferol (CALCIUM  600+D3 PO), Take 1 tablet by mouth daily., Disp: , Rfl:    ciclopirox (LOPROX) 0.77 % cream, Apply 1 Application topically 2 (two) times daily as needed (irritation on feet.). Apply to toenails, and feet, Disp: , Rfl:    citalopram  (CELEXA ) 20 MG tablet, Take 20 mg by mouth daily., Disp: , Rfl:    diclofenac Sodium (VOLTAREN) 1 % GEL, Apply 2 g topically 4 (four) times daily as needed (pain)., Disp: , Rfl:    DUPIXENT  300 MG/2ML prefilled syringe, , Disp: , Rfl:    fexofenadine  (ALLEGRA ) 180 MG tablet, Take 1 tablet (180 mg total) by mouth 2 (two) times daily as needed for allergies or rhinitis., Disp: 180 tablet, Rfl: 1   fluticasone  (FLONASE ) 50 MCG/ACT nasal spray, Place 1 spray into both nostrils daily as needed for allergies., Disp: , Rfl:    furosemide  (LASIX ) 20  MG tablet, Take 20 mg by mouth daily as needed for edema., Disp: , Rfl:    gabapentin  (NEURONTIN ) 400 MG capsule, Take 400 mg by mouth 3 (three) times daily., Disp: , Rfl:    hydrochlorothiazide  (HYDRODIURIL ) 12.5 MG tablet, Take 12.5 mg by mouth daily., Disp: , Rfl:    ipratropium (ATROVENT) 0.06 % nasal spray, Place 1 spray into both nostrils daily as needed for rhinitis., Disp: , Rfl:    ipratropium-albuterol  (DUONEB) 0.5-2.5 (3)  MG/3ML SOLN, Inhale 3 mLs into the lungs every 4 (four) hours as needed (SOB/Wheezing)., Disp: , Rfl: 2   meloxicam (MOBIC) 7.5 MG tablet, 1 tablet Orally Once a day, Disp: , Rfl:    metoprolol  (LOPRESSOR ) 100 MG tablet, Take 100 mg by mouth 2 (two) times daily., Disp: , Rfl:    montelukast  (SINGULAIR ) 10 MG tablet, TAKE 1 TABLET BY MOUTH EVERY DAY, Disp: 90 tablet, Rfl: 1   MUCINEX  600 MG 12 hr tablet, 1 tablet as needed Orally every 12 hrs for 2 days, Disp: , Rfl:    nystatin (MYCOSTATIN) 100000 UNIT/ML suspension, PLEASE SEE ATTACHED FOR DETAILED DIRECTIONS, Disp: , Rfl:    olmesartan  (BENICAR ) 20 MG tablet, TAKE 1 TABLET BY MOUTH EVERY DAY (Patient taking differently: Take 20 mg by mouth daily.), Disp: 90 tablet, Rfl: 0   omeprazole  (PRILOSEC) 40 MG capsule, Take 1 capsule (40 mg total) by mouth daily., Disp: 90 capsule, Rfl: 1   OXYGEN , Inhale 2 L into the lungs as needed., Disp: , Rfl:    Vitamin D, Ergocalciferol, (DRISDOL) 1.25 MG (50000 UT) CAPS capsule, Take 50,000 Units by mouth every 30 (thirty) days., Disp: , Rfl:   Current Facility-Administered Medications:    Benralizumab  SOSY 30 mg, 30 mg, Subcutaneous, Q8 Weeks, [place] Chuck, MD, 30 mg at 01/16/24 1610   dupilumab  (DUPIXENT ) prefilled syringe 300 mg, 300 mg, Subcutaneous, Q14 Days, [place] Chuck, MD, 300 mg at 04/09/24 1026  Review of Systems: + vision problems, shortness of breath, wheezing, leg swelling, joint pain, back pain, hot flashes Denies appetite changes, fevers, chills,  fatigue, unexplained weight changes. Denies hearing loss, neck lumps or masses, mouth sores, ringing in ears or voice changes. Denies cough. Denies chest pain or palpitations.  Denies abdominal distention, pain, blood in stools, constipation, diarrhea, nausea, vomiting, or early satiety. Denies pain with intercourse, dysuria, frequency, hematuria or incontinence. Denies pelvic pain, vaginal bleeding or vaginal discharge.   Denies muscle pain/cramps. Denies itching, rash, or wounds. Denies dizziness, headaches, numbness or seizures. Denies swollen lymph nodes or glands, denies easy bruising or bleeding. Denies anxiety, depression, confusion, or decreased concentration.  Physical Exam: BP (!) 132/58 (BP Location: Left Arm, Patient Position: Sitting)   Pulse (!) 58   Temp 98.6 F (37 C) (Oral)   Resp 17   Ht 5\' 2"  (1.575 m)   Wt 163 lb 9.6 oz (74.2 kg)   SpO2 97%   BMI 29.92 kg/m  General: Alert, oriented, no acute distress. HEENT: Normocephalic, atraumatic, sclera anicteric. Chest: Mild expiratory wheezes, otherwise lungs clear to auscultation bilaterally. Cardiovascular: Regular rate and rhythm, no murmurs. Abdomen: Obese, soft, nontender.  Moderate upper abdominal distention, stable.  Normoactive bowel sounds.  No masses or hepatosplenomegaly appreciated.  Well healed incisions. Extremities: Grossly normal range of motion.  Warm, well perfused.  No edema bilaterally. Skin: No rashes or lesions noted. Lymphatics: No cervical, supraclavicular, or inguinal adenopathy. GU: Normal appearing external genitalia without erythema, excoriation, or lesions.  Speculum exam reveals cuff intact, mildly atrophic vagina.  Bimanual exam reveals no masses or nodularity appreciated.  Rectovaginal exam confirms exam findings.  Laboratory & Radiologic Studies:     Component Ref Range & Units (hover) 1 yr ago (03/11/23) 1 yr ago (06/17/22)  Cancer Antigen (CA) 125 18.8 34.8 CM     Assessment &  Plan: Debra [name] is a 78 y.o. 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 Surgery 2023.  Patient is overall doing well.  NED on exam.  CA-125 ordered today. CA-125 and HE4 were initially elevated at time of diagnosis.   We have previously discussed no true guidelines for surveillance in the setting.  We have previously discussed alternating visits between my office and her OB/GYN every 6 months.  We will see her back in 1 year.  We reviewed signs and symptoms that would be concerning for recurrence of her borderline tumor.  21 minutes of total time was spent for this patient encounter, including preparation, face-to-face counseling with the patient and coordination of care, and documentation of the encounter.  Wiley Hanger, MD  Division of Gynecologic Oncology  Department of Obstetrics and Gynecology  [facility]

## 2024-04-27 NOTE — Patient Instructions (Signed)
 It was good to see you today.  I do not see or feel any evidence of recurrence of your borderline tumor.   We will see you for follow-up in 12 months.  Please reach out to your OB/GYN for follow-up in 6 months.  As always, if you develop any new and concerning symptoms before your next visit, please call to see me sooner.

## 2024-04-28 LAB — CA 125: Cancer Antigen (CA) 125: 27.1 U/mL (ref 0.0–38.1)

## 2024-05-02 ENCOUNTER — Ambulatory Visit

## 2024-05-02 ENCOUNTER — Other Ambulatory Visit: Payer: Self-pay | Admitting: Allergy & Immunology

## 2024-05-02 DIAGNOSIS — J4550 Severe persistent asthma, uncomplicated: Secondary | ICD-10-CM

## 2024-05-21 ENCOUNTER — Ambulatory Visit

## 2024-05-21 DIAGNOSIS — J4550 Severe persistent asthma, uncomplicated: Secondary | ICD-10-CM | POA: Diagnosis not present

## 2024-05-24 ENCOUNTER — Ambulatory Visit: Admitting: Obstetrics & Gynecology

## 2024-05-24 ENCOUNTER — Other Ambulatory Visit (HOSPITAL_COMMUNITY)
Admission: RE | Admit: 2024-05-24 | Discharge: 2024-05-24 | Disposition: A | Discharge status: Home / Self Care | Source: Ambulatory Visit | Attending: Obstetrics & Gynecology | Admitting: Obstetrics & Gynecology

## 2024-05-24 ENCOUNTER — Encounter: Payer: Self-pay | Admitting: Obstetrics & Gynecology

## 2024-05-24 VITALS — BP 153/67 | HR 52 | Ht 62.0 in | Wt 165.0 lb

## 2024-05-24 DIAGNOSIS — I10 Essential (primary) hypertension: Secondary | ICD-10-CM | POA: Diagnosis not present

## 2024-05-24 DIAGNOSIS — Z4689 Encounter for fitting and adjustment of other specified devices: Secondary | ICD-10-CM | POA: Diagnosis not present

## 2024-05-24 DIAGNOSIS — N8110 Cystocele, unspecified: Secondary | ICD-10-CM | POA: Diagnosis not present

## 2024-05-24 DIAGNOSIS — N898 Other specified noninflammatory disorders of vagina: Secondary | ICD-10-CM

## 2024-05-24 NOTE — Progress Notes (Signed)
 GYN VISIT Patient name: Debra [name] MRN [id number]  Date of birth: 1946-09-04 Chief Complaint:   Follow-up  History of Present Illness:   Debra [name] is a 78 y.o. [id number] PM, PH female being seen today for follow up regarding:  -Vaginal burning inside- usually at night.  During the day no issues. Denies discharge, some odor, no itching  Still using the triamcinolone  cream as needed not very regularly.  -POP/Urinary concerns: At her last visit she was considering replacing the pessary; however, she did not bring in the device.  Today she does have the pessary for replacement.    She notes that the vaginal bulge is not bothering her, but rather she has incomplete voiding.  She notes that she will have a strong urge, but when she goes to the bathroom no urine comes out.  She has not tried splinting.  Denies incontinence  No LMP recorded. Patient has had a hysterectomy.    Review of Systems:   Pertinent items are noted in HPI Denies fever/chills, dizziness, headaches, visual disturbances, fatigue, shortness of breath, chest pain, abdominal pain, vomiting Pertinent History Reviewed:   Past Surgical History:  Procedure Laterality Date   CHOLECYSTECTOMY     CORONARY ARTERY BYPASS GRAFT     PARTIAL HYSTERECTOMY  1981   ovaries left in situ, was done for fibroids   ROBOTIC ASSISTED BILATERAL SALPINGO OOPHERECTOMY Bilateral 07/27/2022   Procedure: XI ROBOTIC ASSISTED BILATERAL SALPINGO OOPHORECTOMY STAGING,;  Surgeon: Suzi Essex, MD;  Location: [facility];  Service: Gynecology;  Laterality: Bilateral;    Past Medical History:  Diagnosis Date   Allergic rhinitis    Arthritis    Asthma    Chronic kidney disease (CKD)    COPD (chronic obstructive pulmonary disease) (HCC)    Diabetes mellitus without complication (HCC)    Dyspnea    Hyperlipidemia    Hypertension    Hypertensive chronic kidney disease    Recurrent upper respiratory infection (URI)    Secondary  hyperparathyroidism of renal origin [facility])    Reviewed problem list, medications and allergies. Physical Assessment:   Vitals:   05/24/24 0842 05/24/24 0903  BP: (!) 171/67 (!) 153/67  Pulse: 84 (!) 52  Weight: 165 lb (74.8 kg)   Height: 5\' 2"  (1.575 m)   Body mass index is 30.18 kg/m.       Physical Examination:   General appearance: alert, well appearing, and in no distress  Psych: mood appropriate, normal affect  Skin: warm & dry   Cardiovascular: normal heart rate noted  Respiratory: normal respiratory effort, no distress  Abdomen: soft, non-tender, no rebound, no guarding  Pelvic: VULVA: normal appearing vulva with no masses, tenderness or lesions, VAGINA: normal appearing vagina with normal color, minimal white discharge, no lesions.  Redundant vaginal tissue noted.  Stage 1 cystocele noted.  Uterus and cervix surgically absent.  Milex ring #2 placed without difficulty  Extremities: no edema   Chaperone: Lorean Rodes    Assessment & Plan:  1) Vulvar irritation/dermatitis -plan to r/o underlying infection []  if negative will review OTC remedies such as Replens/Hyaluronic acid -continue with judicious use of steroid cream  2) POP/Incomplete voiding/pessary maintenance -pessary placed, f/u in 3mos -should it fall out, pt may try self-splinting  3) Chronic HTN -f/u with PCP  No orders of the defined types were placed in this encounter.   Return in about 3 months (around 08/24/2024) for pessary follow up with Dr. Akeila Lana.   Kseniya Grunden, DO Attending  Obstetrician & Gynecologist, Biochemist, clinical for [employer], [facility]

## 2024-05-25 LAB — CERVICOVAGINAL ANCILLARY ONLY
Bacterial Vaginitis (gardnerella): POSITIVE — AB
Candida Glabrata: NEGATIVE
Candida Vaginitis: NEGATIVE
Comment: NEGATIVE
Comment: NEGATIVE
Comment: NEGATIVE

## 2024-05-26 ENCOUNTER — Other Ambulatory Visit: Payer: Self-pay | Admitting: Obstetrics & Gynecology

## 2024-05-26 ENCOUNTER — Ambulatory Visit: Payer: Self-pay | Admitting: Obstetrics & Gynecology

## 2024-05-26 DIAGNOSIS — N760 Acute vaginitis: Secondary | ICD-10-CM

## 2024-05-26 MED ORDER — METRONIDAZOLE 500 MG PO TABS
500.0000 mg | ORAL_TABLET | Freq: Two times a day (BID) | ORAL | 0 refills | Status: AC
Start: 2024-05-26 — End: 2024-06-02

## 2024-05-26 NOTE — Progress Notes (Signed)
 Rx for Flagyl  2/2 BV  Keene Pastures, DO Attending Obstetrician & Gynecologist, [facility] for [employer], [facility]

## 2024-06-11 ENCOUNTER — Ambulatory Visit (INDEPENDENT_AMBULATORY_CARE_PROVIDER_SITE_OTHER)

## 2024-06-11 DIAGNOSIS — J4550 Severe persistent asthma, uncomplicated: Secondary | ICD-10-CM

## 2024-06-12 ENCOUNTER — Other Ambulatory Visit (HOSPITAL_COMMUNITY)
Admission: RE | Admit: 2024-06-12 | Discharge: 2024-06-12 | Disposition: A | Discharge status: Home / Self Care | Source: Ambulatory Visit | Attending: Obstetrics & Gynecology | Admitting: Obstetrics & Gynecology

## 2024-06-12 ENCOUNTER — Ambulatory Visit: Admitting: Obstetrics & Gynecology

## 2024-06-12 ENCOUNTER — Encounter: Payer: Self-pay | Admitting: Obstetrics & Gynecology

## 2024-06-12 ENCOUNTER — Telehealth: Payer: Self-pay | Admitting: Obstetrics & Gynecology

## 2024-06-12 VITALS — BP 139/68 | HR 50 | Ht 62.0 in | Wt 165.0 lb

## 2024-06-12 DIAGNOSIS — Z960 Presence of urogenital implants: Secondary | ICD-10-CM

## 2024-06-12 DIAGNOSIS — N898 Other specified noninflammatory disorders of vagina: Secondary | ICD-10-CM | POA: Insufficient documentation

## 2024-06-12 DIAGNOSIS — N9489 Other specified conditions associated with female genital organs and menstrual cycle: Secondary | ICD-10-CM

## 2024-06-12 DIAGNOSIS — B9689 Other specified bacterial agents as the cause of diseases classified elsewhere: Secondary | ICD-10-CM | POA: Diagnosis not present

## 2024-06-12 MED ORDER — METRONIDAZOLE 500 MG PO TABS: 500.0000 mg | ORAL_TABLET | Freq: Two times a day (BID) | ORAL | 0 refills | Status: AC

## 2024-06-12 MED ORDER — METRONIDAZOLE 0.75 % VA GEL: 1.0000 | VAGINAL | 1 refills | Status: DC

## 2024-06-12 NOTE — Telephone Encounter (Signed)
 Left message @ 10:37 am. Can do self swab or see Dr. Ozan per Dr. Ozan. JSY

## 2024-06-12 NOTE — Telephone Encounter (Signed)
 Patient calling stating that she has completed the meds that was sent in but the odor has returned,and still burning. Wants to know what else can be done. And states that she uses female sparys and wants to know if that could cause any issues.

## 2024-06-12 NOTE — Progress Notes (Signed)
   GYN VISIT Patient name: Debra [name] MRN [id number]  Date of birth: 1946/03/10 Chief Complaint:   odor and burning in vaginal area  History of Present Illness:   Debra [name] is a 78 y.o. [id number] PM, PH  female being seen today for follow up regarding:  -Vaginitis: In review, Rx for BV sent in 6/7.  Today she notes that it did seem to help, but as soon as she finished the symptoms returned.  Mostly at night she has burning.  Used the triamcinolone  ointment, which did seem to help some.  Notes a strong odor when she is voiding.  Denies vaginal bleeding or itching.  She reports no other acute concerns  It does seem as though the pessary is helping she notes improvement of her urinary stream and does not notice a vaginal bulge  No LMP recorded. Patient has had a hysterectomy.    Review of Systems:   Pertinent items are noted in HPI Denies fever/chills, dizziness, headaches, visual disturbances, fatigue, shortness of breath, chest pain, abdominal pain, vomiting Pertinent History Reviewed:   Past Surgical History:  Procedure Laterality Date   CHOLECYSTECTOMY     CORONARY ARTERY BYPASS GRAFT     PARTIAL HYSTERECTOMY  1981   ovaries left in situ, was done for fibroids   ROBOTIC ASSISTED BILATERAL SALPINGO OOPHERECTOMY Bilateral 07/27/2022   Procedure: XI ROBOTIC ASSISTED BILATERAL SALPINGO OOPHORECTOMY STAGING,;  Surgeon: Viktoria Comer SAUNDERS, MD;  Location: [facility];  Service: Gynecology;  Laterality: Bilateral;    Past Medical History:  Diagnosis Date   Allergic rhinitis    Arthritis    Asthma    Chronic kidney disease (CKD)    COPD (chronic obstructive pulmonary disease) (HCC)    Diabetes mellitus without complication (HCC)    Dyspnea    Hyperlipidemia    Hypertension    Hypertensive chronic kidney disease    Recurrent upper respiratory infection (URI)    Secondary hyperparathyroidism of renal origin [facility])    Reviewed problem list, medications and allergies. Physical  Assessment:   Vitals:   06/12/24 1330  BP: 139/68  Pulse: (!) 50  Weight: 165 lb (74.8 kg)  Height: 5' 2 (1.575 m)  Body mass index is 30.18 kg/m.       Physical Examination:   General appearance: alert, well appearing, and in no distress  Psych: mood appropriate, normal affect  Skin: warm & dry   Cardiovascular: normal heart rate noted  Respiratory: normal respiratory effort, no distress  Abdomen: soft, non-tender   Pelvic: VULVA: no lesions noted, vitiligo previously noted, VAGINA: normal appearing vagina copious frothy white to yellow discharge noted. Pessary in place  Extremities: no edema   Chaperone: Clarita Salt    Assessment & Plan:  1) Bacterial vaginosis -plan to recheck and retreat -suspect pH imbalance due to pessary -plan for daily Metronidazole  then weekly vaginal cream as needed  2)pessary in place -should recurrent BV continue, may decide against pessary    No orders of the defined types were placed in this encounter.   Return for already on 3 mos follow up.   Vandell Kun, DO Attending Obstetrician & Gynecologist, [facility] for [employer], [facility]

## 2024-06-12 NOTE — Telephone Encounter (Signed)
 Spoke with pt. Pt wants an appt with Dr. Ozan. Call transferred to [facility] for appt. JSY

## 2024-06-14 ENCOUNTER — Ambulatory Visit: Payer: Self-pay | Admitting: Obstetrics & Gynecology

## 2024-06-14 LAB — CERVICOVAGINAL ANCILLARY ONLY
Bacterial Vaginitis (gardnerella): POSITIVE — AB
Candida Glabrata: NEGATIVE
Candida Vaginitis: NEGATIVE
Comment: NEGATIVE
Comment: NEGATIVE
Comment: NEGATIVE

## 2024-06-20 ENCOUNTER — Ambulatory Visit: Admitting: Allergy & Immunology

## 2024-06-29 ENCOUNTER — Ambulatory Visit

## 2024-06-29 ENCOUNTER — Ambulatory Visit: Admitting: Allergy & Immunology

## 2024-06-29 ENCOUNTER — Encounter: Payer: Self-pay | Admitting: Allergy & Immunology

## 2024-06-29 ENCOUNTER — Other Ambulatory Visit: Payer: Self-pay

## 2024-06-29 VITALS — BP 120/72 | HR 56 | Temp 97.2°F | Resp 18 | Ht 60.63 in | Wt 163.4 lb

## 2024-06-29 DIAGNOSIS — J4550 Severe persistent asthma, uncomplicated: Secondary | ICD-10-CM | POA: Diagnosis not present

## 2024-06-29 DIAGNOSIS — K219 Gastro-esophageal reflux disease without esophagitis: Secondary | ICD-10-CM

## 2024-06-29 DIAGNOSIS — J3089 Other allergic rhinitis: Secondary | ICD-10-CM

## 2024-06-29 MED ORDER — AZELASTINE-FLUTICASONE 137-50 MCG/ACT NA SUSP: 1.0000 | Freq: Two times a day (BID) | NASAL | 1 refills | Status: DC

## 2024-06-29 MED ORDER — OMEPRAZOLE 40 MG PO CPDR: 40.0000 mg | DELAYED_RELEASE_CAPSULE | Freq: Every day | ORAL | 1 refills | Status: DC

## 2024-06-29 NOTE — Progress Notes (Signed)
 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 Darlean)   Perennial allergic rhinitis    Previous smoker (30+ years)   Recurrent infections - antibiotics 3-4 times per year (sinusitis mostly) but with excellent response to Pnuemovax   RA - was on chronic prednisone , but now prednisone -free  Plan/Recommendations:   1. Asthma-COPD overlap syndrome, uncomplicated - Lung testing looks MUCH better today, although it is still not normal.  - We are not going to make any medication changes at this point in time,  - Daily controller medication(s): Fasenra  30mg  every 8 weeks and Breztri  two puffs twice daily with spacer - Prior to physical activity: albuterol  2 puffs 10-15 minutes before physical activity. - Rescue medications: albuterol  4 puffs every 4-6 hours as needed - Asthma control goals:  * Full participation in all desired activities (may need albuterol  before activity) * Albuterol  use two time or less a week on average (not counting use with activity) * Cough interfering with sleep two time or less a month * Oral steroids no more than once a year * No hospitalizations  2. Perennial allergic rhinitis - Continue with Allegra  one tablet daily (you can INCREASE to twice daily)  - Continue with Flonase  one spray per nostril TWICE daily. - Continue with nasal saline rinses as needed.  3. GERD  - Continue omeprazole  40mg  daily.   4. Return in about 6 months (around 12/30/2024). You can have the follow up appointment with Dr. Iva or a Nurse Practicioner (our Nurse Practitioners are excellent and always have Physician oversight!).   Subjective:   Debra [name] is a 78 y.o. 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   Moderate persistent asthma, uncomplicated 10/25/2023   Coronary arteriosclerosis 10/25/2023   Shortness of breath 10/25/2023   Cylindrical bronchiectasis (HCC) 10/25/2023   Hypercholesteremia 10/25/2023   Chronic respiratory failure with hypoxia and hypercapnia (HCC) 10/25/2023   COPD with acute exacerbation (HCC) 12/11/2022   Depression 12/11/2022   Chronic kidney disease, stage 3b (HCC) 12/11/2022   Sepsis (HCC) 12/11/2022   CAP (community acquired pneumonia) 12/10/2022   Pelvic mass 07/27/2022   Ovarian cyst 03/08/2022   Essential hypertension 06/11/2021   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  Discussed the use of AI scribe software for clinical note transcription with the patient and/or guardian, who gave verbal consent to proceed.  Debra [name] is a 78 y.o. female presenting for a follow up visit.  She was last seen in March 2025.  At that time, her lung testing looked a little bit better than last time.  We continued with Fasenra  30 mg every 8 weeks and Breztri  2 puffs twice daily.  For her rhinitis, we continue with Allegra  as well as Flonase .  For her reflux, we will start her on omeprazole .  Since the  last visit, she has done well.  Asthma/Respiratory Symptom History: The patient has shown improvement in lung function, with measurements increasing from 0.59 liters to 0.82 liters, and another from 0.88 liters to 1.02 liters. The patient reports a change in medication from Fasenra  to Dupixent , which is administered at the  clinic. They have not required prednisone  for their respiratory issues since last year. They continue to use Breztri  as part of their treatment regimen.  Allergic Rhinitis Symptom History: They experience intermittent nasal congestion, described as 'come and go', and use a nasal spray daily to manage drainage. While the spray helps, it causes headaches that eventually improve. She is more prone to tolerate the headaches rather than the runny nose.   Regarding their arthritis, they have not been on prednisone  and manage occasional flare-ups with cortisone shots, which they have not needed for over a year. 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  Infection Symptom History: She has not had any sinus infections since we last saw her.   She recently traveled to [place]  three months ago, visiting [place] and museums, but did not engage in hiking due to their arthritis. She is a self-described home body.    Otherwise, there have been no changes to her past medical history, surgical history, family history, or social history.    Review of systems otherwise negative other than that mentioned in the HPI.    Objective:   Blood pressure 120/72, pulse (!) 56, temperature (!) 97.2 F (36.2 C), temperature source Temporal, resp. rate 18, height 5' 0.63 (1.54 m), weight 163 lb 6.4 oz (74.1 kg), SpO2 95%. Body mass index is 31.25 kg/m.    Physical Exam Vitals reviewed.  Constitutional:      Appearance: She is well-developed.     Comments: Smiling and laughing.  HENT:     Head: Normocephalic and atraumatic.     Right Ear: Tympanic membrane, ear canal and external ear normal. No drainage, swelling or tenderness. Tympanic membrane is not injected, scarred, erythematous, retracted or bulging.     Left Ear: Tympanic membrane, ear canal and external ear normal. No drainage, swelling or tenderness. Tympanic membrane is not  injected, scarred, erythematous, retracted or bulging.     Nose: No nasal deformity, septal deviation, mucosal edema or rhinorrhea.     Right Sinus: No maxillary sinus tenderness or frontal sinus tenderness.     Left Sinus: No maxillary sinus tenderness or frontal sinus tenderness.     Mouth/Throat:     Mouth: Mucous membranes are not pale and not dry.     Pharynx: Uvula midline.  Eyes:     General:        Right eye: No discharge.        Left eye: No discharge.     Conjunctiva/sclera: Conjunctivae normal.     Right eye: Right conjunctiva is not injected. No chemosis.    Left eye: Left conjunctiva is not injected. No chemosis.    Pupils: Pupils are equal, round, and reactive to light.  Cardiovascular:     Rate and Rhythm: Normal rate and regular rhythm.     Heart sounds: Normal heart sounds.  Pulmonary:     Effort: Pulmonary effort is normal. No tachypnea, accessory muscle usage or respiratory distress.     Breath sounds: Normal breath sounds. No wheezing, rhonchi or rales.  Chest:     Chest wall: No tenderness.  Abdominal:     Tenderness:  There is no abdominal tenderness. There is no guarding or rebound.  Lymphadenopathy:     Head:     Right side of head: No submandibular, tonsillar or occipital adenopathy.     Left side of head: No submandibular, tonsillar or occipital adenopathy.     Cervical: No cervical adenopathy.  Skin:    Coloration: Skin is not pale.     Findings: No abrasion, erythema, petechiae or rash. Rash is not papular, urticarial or vesicular.  Neurological:     Mental Status: She is alert.  Psychiatric:        Behavior: Behavior is cooperative.      Diagnostic studies:    Spirometry: results abnormal (FEV1: 0.82/54%, FVC: 1.02/52%, FEV1/FVC: 80%).    Spirometry consistent with possible restrictive disease. This is much better than that obtained at the last visit. It is still not normal, but certainly better than it has been in the past.   Allergy Studies:  none      Marty Shaggy, MD  [facility] 

## 2024-06-29 NOTE — Patient Instructions (Addendum)
 1. Asthma-COPD overlap syndrome, uncomplicated - Lung testing looks MUCH better today, although it is still not normal.  - We are not going to make any medication changes at this point in time,  - Daily controller medication(s): Fasenra  30mg  every 8 weeks and Breztri  two puffs twice daily with spacer - Prior to physical activity: albuterol  2 puffs 10-15 minutes before physical activity. - Rescue medications: albuterol  4 puffs every 4-6 hours as needed - Asthma control goals:  * Full participation in all desired activities (may need albuterol  before activity) * Albuterol  use two time or less a week on average (not counting use with activity) * Cough interfering with sleep two time or less a month * Oral steroids no more than once a year * No hospitalizations  2. Perennial allergic rhinitis - Continue with Allegra  one tablet daily (you can INCREASE to twice daily)  - Continue with Flonase  one spray per nostril TWICE daily. - Continue with nasal saline rinses as needed.  3. GERD  - Continue omeprazole  40mg  daily.   4. Return in about 6 months (around 12/30/2024). You can have the follow up appointment with Dr. Iva or a Nurse Practicioner (our Nurse Practitioners are excellent and always have Physician oversight!).    Please inform us  of any Emergency Department visits, hospitalizations, or changes in symptoms. Call us  before going to the ED for breathing or allergy symptoms since we might be able to fit you in for a sick visit. Feel free to contact us  anytime with any questions, problems, or concerns.  It was a pleasure to see you again today! FEEL BETTER!   Websites that have reliable patient information: 1. American Academy of Asthma, Allergy, and Immunology: www.aaaai.org 2. Food Allergy Research and Education (FARE): foodallergy.org 3. Mothers of Asthmatics: http://www.asthmacommunitynetwork.org 4. American College of Allergy, Asthma, and Immunology:  www.acaai.org      "Like" us  on Facebook and Instagram for our latest updates!      A healthy democracy works best when [employer] participate! Make sure you are registered to vote! If you have moved or changed any of your contact information, you will need to get this updated before voting! Scan the QR codes below to learn more!

## 2024-07-16 ENCOUNTER — Ambulatory Visit (INDEPENDENT_AMBULATORY_CARE_PROVIDER_SITE_OTHER)

## 2024-07-16 DIAGNOSIS — J4550 Severe persistent asthma, uncomplicated: Secondary | ICD-10-CM | POA: Diagnosis not present

## 2024-07-17 NOTE — Progress Notes (Unsigned)
 Debra [name], female    DOB: [birth year]   MRN: [id number]   Brief patient profile:  70  yobf quit smoking 2013 with onset of rhinitis/ asthma in her 40s better since rx fosenra from [facility] 2021 prev under care of Debra [name] referred to pulmonary clinic in [place]  06/11/2021 by Debra [name]    History of Present Illness  06/11/2021  Pulmonary/ 1st [name] eval/ Debra [name] / [place] [name]  maint on breztri  2 bid / acei and prednisone  per arthritis doctor/ fasenra  per Debra [name] Complaint  Patient presents with   Consult    Patient reports she was diagnosed with COPD 7 years ago, 04/2012. She reports she may take a while to get some sleep.    Dyspnea:  limited by knees > sob/ able to weed eat and do most yard work  Cough: none  Sleep: sleep flat bed/ one pillow  SABA use: last used 6/20 when got hot assoc with subjective wheeze   Still on prednisone  per Debra [name] for arthritis  Rec Plan A = Automatic = Always=    breztri  Take 2 puffs first thing in am and then another 2 puffs about 12 hours later.   Work on inhaler technique:   Plan B = Backup (to supplement plan A, not to replace it) Only use your albuterol  inhaler as a rescue medication  Plan C = Crisis (instead of Plan B but only if Plan B stops working) - only use your albuterol  nebulizer if you first try Plan B   Stop lisinopril and replace with olmesartan  20 mg one daily and your wheezing should improve and let your primary doctor know    Spirometry 10/04/23 restrictive with min curvature to f/v loop    10/25/2023  f/u ov/Debra [name]/Debra [name] re: asthma maint on singulair  breztri /fasenra  high dose lopressor  [name] Complaint  Patient presents with   Asthma-COPD overlap syndrome  Dyspnea:  walking at [employer] at least once or twice a week pushing cart s 02 /uses [facility] parking sometimes  Cough: resolved  Sleeping: flat bed one pillow s   resp cc  SABA use: rarely, only uses neb when gets sick   02: just started in Aug 2024 2lpm hs and prn but not titrating daytime  Lung cancer screening: deferred to PCP Rec Work on inhaler technique:   Make sure you check your oxygen  saturation  AT  your highest level of activity (not after you stop)   to be sure it stays over 90%  My [name] will be contacting you by phone for referral for PFTs   You are eligible for a free low dose CT chest until 2028 > defer to PCP  You should consider changing lopressor  100  to Bisoprolol 10 mg twice daily  > defer PCP   Please schedule a follow up visit in 3 months but call sooner if needed  with all medications /inhalers/ solutions in hand    01/25/2024  f/u ov/ [name]/Debra [name] re: asthma maint on breztri / montelukast   did not bring meds  Last prednisone  3 weeks prior to OV  per Debra [name] covid  [name] Complaint  Patient presents with   Follow-up    Follow up  copd   Dyspnea:  doing [employer] sho[employer]ping pushing cart/ inclines are a problem  Cough: left over from covid but improving  Sleeping: flat bed/ one pillow    resp cc  SABA use: rarely needs but has hfa and neb   02 2lpm hs /  prn  Rec Plan A = Automatic = Always=    breztri  Take 2 puffs first thing in am and then another 2 puffs about 12 hours later.   Work on inhaler technique:   Plan B = Backup (to supplement plan A, not to replace it) Only use your albuterol  inhaler as a rescue medication Plan C = Crisis (instead of Plan B but only if Plan B stops working) - only use your albuterol  nebulizer if you first try Plan B    Please schedule a follow up visit in 6 months but call sooner if needed  - bring spacer arline    07/20/2024  f/u ov/Debra [name]/Debra [name] re: AB/ 02 dep hs and prn maint on breztri  and now dupixent  and doing better vs fosenra   [name] Complaint  Patient presents with   Follow-up   Asthma  Dyspnea:  uses [employer] parking due to doe  but then able to push a card around [employer] and [employer] also  Cough: daytime rattle  assoc pnds / nasal congestion  Sleeping: bed is flat/ 2pillow s    resp cc  SABA use: twice daily  02: at bedtime 2lpm   Lung cancer screening: per Debra [name] per Pt    No obvious day to day or daytime variability or assoc excess/ purulent sputum or mucus plugs or hemoptysis or cp or chest tightness, subjective wheeze or overt   hb symptoms.    Also denies any obvious fluctuation of symptoms with weather or environmental changes or other aggravating or alleviating factors except as outlined above   No unusual exposure hx or h/o childhood pna/ asthma or knowledge of premature birth.  Current Allergies, Complete Past Medical History, Past Surgical History, Family History, and Social History were reviewed in Debra [name].  ROS  The following are not active complaints unless bolded Hoarseness, sore throat, dysphagia, dental problems, itching, sneezing,  nasal congestion or discharge of excess mucus or purulent secretions, ear ache,   fever, chills, sweats, unintended wt loss or wt gain, classically pleuritic or exertional cp,  orthopnea pnd or arm/hand swelling  or leg swelling, presyncope, palpitations, abdominal pain, anorexia, nausea, vomiting, diarrhea  or change in bowel habits or change in bladder habits, change in stools or change in urine, dysuria, hematuria,  rash, arthralgias, visual complaints, headache, numbness, weakness or ataxia or problems with walking or coordination,  change in mood or  memory.        Current Meds  Medication Sig   acetaminophen  (TYLENOL ) 325 MG tablet Take 650 mg by mouth every 6 (six) hours as needed for moderate pain.   albuterol  (PROVENTIL ) (2.5 MG/3ML) 0.083% nebulizer solution Take 2.5 mg by nebulization every 6 (six) hours as needed.   amLODipine  (NORVASC ) 5 MG tablet Take 5 mg by mouth daily.   Ascorbic Acid (VITAMIN C PO) Take by mouth.   aspirin  81 MG tablet Take 81 mg by mouth daily.   atorvastatin  (LIPITOR) 80 MG tablet Take  80 mg by mouth daily.   Azelastine -Fluticasone  (DYMISTA ) 137-50 MCG/ACT SUSP Place 1 spray into both nostrils 2 (two) times daily.   baclofen (LIORESAL) 10 MG tablet 1 tablet as needed Orally Twice a day for 10 days As needed, take when at home only   BREZTRI  AEROSPHERE 160-9-4.8 MCG/ACT AERO Inhale 2 puffs into the lungs 2 (two) times daily.   calcitRIOL (ROCALTROL) 0.25 MCG capsule Take 0.25 mcg by mouth 2 (two) times a week.   Calcium   Carb-Cholecalciferol (CALCIUM  600+D3 PO) Take 1 tablet by mouth daily.   CALCIUM  MAGNESIUM  ZINC PO Take by mouth.   ciclopirox (LOPROX) 0.77 % cream Apply 1 Application topically 2 (two) times daily as needed (irritation on feet.). Apply to toenails, and feet   citalopram  (CELEXA ) 20 MG tablet Take 20 mg by mouth daily.   diclofenac Sodium (VOLTAREN) 1 % GEL Apply 2 g topically 4 (four) times daily as needed (pain).   DUPIXENT  300 MG/2ML prefilled syringe    fexofenadine  (ALLEGRA ) 180 MG tablet TAKE 1 TABLET (180 MG TOTAL) BY MOUTH 2 (TWO) TIMES DAILY AS NEEDED FOR ALLERGIES OR RHINITIS.   fluticasone  (FLONASE ) 50 MCG/ACT nasal spray Place 1 spray into both nostrils daily as needed for allergies.   furosemide  (LASIX ) 20 MG tablet Take 20 mg by mouth daily as needed for edema.   gabapentin  (NEURONTIN ) 400 MG capsule Take 400 mg by mouth 3 (three) times daily.   hydrochlorothiazide  (HYDRODIURIL ) 12.5 MG tablet Take 12.5 mg by mouth daily.   ipratropium (ATROVENT) 0.06 % nasal spray Place 1 spray into both nostrils daily as needed for rhinitis.   ipratropium-albuterol  (DUONEB) 0.5-2.5 (3) MG/3ML SOLN Inhale 3 mLs into the lungs every 4 (four) hours as needed (SOB/Wheezing).   meloxicam (MOBIC) 7.5 MG tablet 1 tablet Orally Once a day   metoprolol  (LOPRESSOR ) 100 MG tablet Take 100 mg by mouth 2 (two) times daily.   metroNIDAZOLE  (METROGEL ) 0.75 % vaginal gel Place 1 Applicatorful vaginally once a week.   montelukast  (SINGULAIR ) 10 MG tablet TAKE 1 TABLET BY MOUTH  EVERY DAY   MUCINEX  600 MG 12 hr tablet 1 tablet as needed Orally every 12 hrs for 2 days   nystatin (MYCOSTATIN) 100000 UNIT/ML suspension PLEASE SEE ATTACHED FOR DETAILED DIRECTIONS   olmesartan  (BENICAR ) 20 MG tablet TAKE 1 TABLET BY MOUTH EVERY DAY   Omega-3 Fatty Acids (FISH OIL PO) Take by mouth.   omeprazole  (PRILOSEC) 40 MG capsule Take 1 capsule (40 mg total) by mouth daily.   OXYGEN  Inhale 2 L into the lungs as needed.   prednisoLONE sodium phosphate  (INFLAMASE FORTE) 1 % ophthalmic solution Place 1 drop into the left eye 4 (four) times daily.   UNABLE TO FIND Alaplex-daily   Vitamin D, Ergocalciferol, (DRISDOL) 1.25 MG (50000 UT) CAPS capsule Take 50,000 Units by mouth every 30 (thirty) days.   Current Facility-Administered Medications for the 07/20/24 encounter ([name] Visit) with Darlean Ozell NOVAK, MD  Medication   Benralizumab  SOSY 30 mg   dupilumab  (DUPIXENT ) prefilled syringe 300 mg            Past Medical History:  Diagnosis Date   Allergic rhinitis    Asthma    COPD (chronic obstructive pulmonary disease) (HCC)    Diabetes mellitus without complication (HCC)    Hyperlipidemia    Hypertension    Recurrent upper respiratory infection (URI)       Objective:    Wts  07/20/2024         164   01/25/2024         165  10/25/2023       166  10/15/2022     169  06/11/2022       176   06/11/21 182 lb (82.6 kg)  03/25/21 188 lb 3.2 oz (85.4 kg)  12/03/20 182 lb 3.2 oz (82.6 kg)     Vital signs reviewed  07/20/2024  - Note at rest 02 sats  90% on RA  General appearance:    pleasant amb mod (by BMI)  obese bf nad    HEENT : Oropharynx  clear  Nasal turbinates nl   NECK :  without  apparent JVD/ palpable Nodes/TM/ prominent pseudowheeze resolves with plb   LUNGS: no acc muscle use,  Min barrel  contour chest wall with bilateral  slightly decreased bs s audible wheeze and  without cough on insp or exp maneuvers and min  Hyperresonant  to  percussion bilaterally    CV:   RRR  no s3 or murmur or increase in P2, and no edema   ABD:  soft and nontender with pos end  insp Hoover's  in the supine position.  No bruits or organomegaly appreciated   MS:  Nl gait/ ext warm without deformities Or obvious joint restrictions  calf tenderness, cyanosis or clubbing     SKIN: warm and dry without lesions    NEURO:  alert, approp, nl sensorium with  no motor or cerebellar deficits apparent.           Assessment

## 2024-07-20 ENCOUNTER — Encounter: Payer: Self-pay | Admitting: Internal Medicine

## 2024-07-20 ENCOUNTER — Ambulatory Visit: Admitting: Internal Medicine

## 2024-07-20 VITALS — BP 127/63 | HR 63 | Ht 60.0 in | Wt 164.0 lb

## 2024-07-20 DIAGNOSIS — J9612 Chronic respiratory failure with hypercapnia: Secondary | ICD-10-CM

## 2024-07-20 DIAGNOSIS — J9611 Chronic respiratory failure with hypoxia: Secondary | ICD-10-CM | POA: Diagnosis not present

## 2024-07-20 DIAGNOSIS — J4489 Other specified chronic obstructive pulmonary disease: Secondary | ICD-10-CM

## 2024-07-20 NOTE — Assessment & Plan Note (Signed)
 HC03   08/22/23   = 32  - placed on 22 Jul 2023 by [facility] doc - 10/25/2023   Walked on RA  x  3  lap(s) =  approx 450  ft  @ mod pace, stopped due to end of study  with lowest 02 sats 90% s sob  so rec titrate walking to keep > 90% and 2lpm   - 07/20/2024 no desats walking 450 ft mod pace RA - 07/20/2024 rec bed blocks to help get some wt off diaphragm supine   Advised re wt loss thru regular submax ex (? Recumbent bike best choice given knee problems) and f/u q 6 m here with regular f/u by Allergy in meantime   Each maintenance medication was reviewed in detail including emphasizing most importantly the difference between maintenance and prns and under what circumstances the prns are to be triggered using an action plan format where appropriate.  Total time for H and P, chart review, counseling, reviewing hfa/ spacer  device(s) , directly observing portions of ambulatory 02 saturation study/ and generating customized AVS unique to this office visit / same day charting = 38 min

## 2024-07-20 NOTE — Assessment & Plan Note (Addendum)
 Quit smoking 2013 with multiple spirometries showing min obsruction/ mostly restriction  - Mellissa rx spring 2021 [place]>>> - Daily pred per arthritic doctor as of 1st pulmonary eval 06/11/2021 with [place] hump noted - 06/11/2021 rec trial off ACEi   - 06/11/2022  After extensive coaching inhaler device,  effectiveness =    25% with hfa  (late insp, very short Ti)  - Spirometry 10/04/23 restrictive with min curvature to f/v loop > see AB - 10/25/2023  After extensive coaching inhaler device,  effectiveness =    near 0 due to very short TI improved with empty breztri  divide to teach inspiratory technique   - 02/20/24 changed fosenra to Dupixent   - 07/20/2024  After extensive coaching inhaler device,  effectiveness =    90% with spacer > continue breztri  - 07/20/2024   Walked on RA  x  3  lap(s) =  approx 450  ft  @ mod pace, stopped due to end of study  with lowest 02 sats 90% and sob at end    [facility] doing will x for mild pseudowheze on FVC better with PLM

## 2024-07-20 NOTE — Patient Instructions (Addendum)
 Bed blocks (risers) x 6-8 in under each headboard  To get the most out of exercise, you need to be continuously aware that you are short of breath, but never out of breath, for at least 30 minutes daily. As you improve, it will actually be easier for you to do the same amount of exercise  in  30 minutes so always push to the level where you are short of breath.     Make sure you check your oxygen  saturations at highest level of activity  and call if trending down  Use portable 02 to keep it above 90% with exertion if needed  Also  Ok to try albuterol  15 min before an activity (on alternating days)  that you know would usually make you short of breath and see if it makes any difference and if makes none then don't take albuterol  after activity unless you can't catch your breath as this means it's the resting that helps, not the albuterol .       Please schedule a follow up visit in 6 months but call sooner if needed

## 2024-07-30 ENCOUNTER — Ambulatory Visit (INDEPENDENT_AMBULATORY_CARE_PROVIDER_SITE_OTHER)

## 2024-07-30 DIAGNOSIS — J4550 Severe persistent asthma, uncomplicated: Secondary | ICD-10-CM

## 2024-08-09 ENCOUNTER — Other Ambulatory Visit: Payer: Self-pay | Admitting: Allergy & Immunology

## 2024-08-13 ENCOUNTER — Ambulatory Visit (INDEPENDENT_AMBULATORY_CARE_PROVIDER_SITE_OTHER)

## 2024-08-13 DIAGNOSIS — J4550 Severe persistent asthma, uncomplicated: Secondary | ICD-10-CM | POA: Diagnosis not present

## 2024-08-27 ENCOUNTER — Ambulatory Visit

## 2024-08-27 DIAGNOSIS — J4550 Severe persistent asthma, uncomplicated: Secondary | ICD-10-CM

## 2024-08-30 ENCOUNTER — Ambulatory Visit (INDEPENDENT_AMBULATORY_CARE_PROVIDER_SITE_OTHER): Admitting: Obstetrics & Gynecology

## 2024-08-30 ENCOUNTER — Encounter: Payer: Self-pay | Admitting: Obstetrics & Gynecology

## 2024-08-30 VITALS — BP 116/61 | HR 57 | Ht 62.0 in | Wt 164.8 lb

## 2024-08-30 DIAGNOSIS — N760 Acute vaginitis: Secondary | ICD-10-CM

## 2024-08-30 DIAGNOSIS — Z4689 Encounter for fitting and adjustment of other specified devices: Secondary | ICD-10-CM

## 2024-08-30 DIAGNOSIS — B9689 Other specified bacterial agents as the cause of diseases classified elsewhere: Secondary | ICD-10-CM

## 2024-08-30 MED ORDER — METRONIDAZOLE 0.75 % VA GEL: VAGINAL | 1 refills | Status: DC

## 2024-08-30 NOTE — Progress Notes (Signed)
     GYN VISIT Patient name: Debra [name] MRN [id number]  Date of birth: [birth year] Chief Complaint:    Chief Complaint  Patient presents with   Pessary Check   History of Present Illness:   Debra [name] is a 78 y.o. H4E6885 PM, PH female being seen today for the following:  -Pessary maintenance: She uses a Milex ring #2.  While the device is working well, she continues to struggle with intermittent recurrent vaginitis.  When she uses the medication it does help; however, she notes that her symptoms will return.  She is still having intermittent discharge.  Denies odor currently denies vaginal bleeding  She otherwise reports no acute concerns  Likert scale(1 not bothersome -5 very bothersome)  :  1  Review of Systems:   Pertinent items are noted in HPI Denies fever/chills, dizziness, headaches, visual disturbances, fatigue, shortness of breath, chest pain, abdominal pain, vomiting Pertinent History Reviewed:  Reviewed past medical,surgical, social, obstetrical and family history.  Reviewed problem list, medications and allergies. Physical Assessment:   Vitals:   08/30/24 1155  BP: 116/61  Pulse: (!) 57  Weight: 164 lb 12.8 oz (74.8 kg)  Height: 5' 2 (1.575 m)  Body mass index is 30.14 kg/m.       Physical Examination:   General appearance: alert, well appearing, and in no distress  Psych: mood appropriate, normal affect  Skin: warm & dry   Cardiovascular: normal heart rate noted  Respiratory: normal respiratory effort, no distress  Abdomen: soft, non-tender   GU: normal external genitalia.   The pessary is removed. Vagina: Exam reveals no undue vaginal mucosal pressure of breakdown, moderate yellow to green discharge and no vaginal bleeding.  Uterus and cervix surgically absent.  Vaginal Epithelial Abnormality Classification System:   0 0    No abnormalities 1    Epithelial erythema 2    Granulation tissue 3    Epithelial break or erosion, 1 cm or less 4     Epithelial break or erosion, 1 cm or greater   Irrigation completed- no vaginal bleeding or abnormalities noted    Extremities: no edema   Chaperone: Aleck Blase    Assessment & Plan:     ICD-10-CM   1. Pessary maintenance  Z46.89     2. Recurrent vaginitis  N76.0     3. Bacterial vaginosis  N76.0 metroNIDAZOLE  (METROGEL ) 0.75 % vaginal gel   B96.89       - Doing well with pessary - Rx for MetroGel  to use for the next 5 days then 1 to twice per week or as needed for symptoms - Also discussed probiotic or other pH balance supplement  Meds ordered this encounter  Medications   metroNIDAZOLE  (METROGEL ) 0.75 % vaginal gel    Sig: Place 1 applicatorful at bedtime for 5 days then as instructed    Dispense:  70 g    Refill:  1      Return in about 3 months (around 11/29/2024) for 3-4 mos pessary follow up.   Narjis Mira, DO Attending Obstetrician & Gynecologist, [facility] for [employer], [facility]

## 2024-09-10 ENCOUNTER — Ambulatory Visit (INDEPENDENT_AMBULATORY_CARE_PROVIDER_SITE_OTHER)

## 2024-09-10 DIAGNOSIS — J4550 Severe persistent asthma, uncomplicated: Secondary | ICD-10-CM

## 2024-09-24 ENCOUNTER — Ambulatory Visit (INDEPENDENT_AMBULATORY_CARE_PROVIDER_SITE_OTHER)

## 2024-09-24 DIAGNOSIS — J4550 Severe persistent asthma, uncomplicated: Secondary | ICD-10-CM | POA: Diagnosis not present

## 2024-10-10 ENCOUNTER — Ambulatory Visit

## 2024-10-10 DIAGNOSIS — J4550 Severe persistent asthma, uncomplicated: Secondary | ICD-10-CM

## 2024-10-24 ENCOUNTER — Ambulatory Visit

## 2024-10-24 ENCOUNTER — Encounter (HOSPITAL_COMMUNITY): Payer: Self-pay

## 2024-10-24 ENCOUNTER — Emergency Department (HOSPITAL_COMMUNITY)

## 2024-10-24 ENCOUNTER — Emergency Department (HOSPITAL_COMMUNITY)
Admission: EM | Admit: 2024-10-24 | Discharge: 2024-10-24 | Disposition: A | Discharge status: Home / Self Care | Attending: Emergency Medicine | Admitting: Emergency Medicine

## 2024-10-24 ENCOUNTER — Other Ambulatory Visit: Payer: Self-pay

## 2024-10-24 DIAGNOSIS — Z7982 Long term (current) use of aspirin: Secondary | ICD-10-CM | POA: Insufficient documentation

## 2024-10-24 DIAGNOSIS — R0602 Shortness of breath: Secondary | ICD-10-CM | POA: Diagnosis not present

## 2024-10-24 DIAGNOSIS — Z7951 Long term (current) use of inhaled steroids: Secondary | ICD-10-CM | POA: Diagnosis not present

## 2024-10-24 DIAGNOSIS — R7401 Elevation of levels of liver transaminase levels: Secondary | ICD-10-CM | POA: Insufficient documentation

## 2024-10-24 DIAGNOSIS — N189 Chronic kidney disease, unspecified: Secondary | ICD-10-CM | POA: Diagnosis not present

## 2024-10-24 DIAGNOSIS — J45901 Unspecified asthma with (acute) exacerbation: Secondary | ICD-10-CM

## 2024-10-24 DIAGNOSIS — M791 Myalgia, unspecified site: Secondary | ICD-10-CM | POA: Insufficient documentation

## 2024-10-24 DIAGNOSIS — R197 Diarrhea, unspecified: Secondary | ICD-10-CM | POA: Insufficient documentation

## 2024-10-24 DIAGNOSIS — J069 Acute upper respiratory infection, unspecified: Secondary | ICD-10-CM

## 2024-10-24 DIAGNOSIS — R059 Cough, unspecified: Secondary | ICD-10-CM | POA: Diagnosis present

## 2024-10-24 DIAGNOSIS — R0981 Nasal congestion: Secondary | ICD-10-CM | POA: Diagnosis not present

## 2024-10-24 DIAGNOSIS — R5381 Other malaise: Secondary | ICD-10-CM | POA: Diagnosis not present

## 2024-10-24 DIAGNOSIS — J449 Chronic obstructive pulmonary disease, unspecified: Secondary | ICD-10-CM | POA: Insufficient documentation

## 2024-10-24 DIAGNOSIS — R112 Nausea with vomiting, unspecified: Secondary | ICD-10-CM | POA: Insufficient documentation

## 2024-10-24 DIAGNOSIS — Z79899 Other long term (current) drug therapy: Secondary | ICD-10-CM | POA: Diagnosis not present

## 2024-10-24 LAB — COMPREHENSIVE METABOLIC PANEL WITH GFR
ALT: 171 U/L — ABNORMAL HIGH (ref 0–44)
AST: 238 U/L — ABNORMAL HIGH (ref 15–41)
Albumin: 4.1 g/dL (ref 3.5–5.0)
Alkaline Phosphatase: 81 U/L (ref 38–126)
Anion gap: 10 (ref 5–15)
BUN: 32 mg/dL — ABNORMAL HIGH (ref 8–23)
CO2: 30 mmol/L (ref 22–32)
Calcium: 9.8 mg/dL (ref 8.9–10.3)
Chloride: 103 mmol/L (ref 98–111)
Creatinine, Ser: 1.3 mg/dL — ABNORMAL HIGH (ref 0.44–1.00)
GFR, Estimated: 42 mL/min — ABNORMAL LOW (ref >60)
Glucose, Bld: 110 mg/dL — ABNORMAL HIGH (ref 70–99)
Potassium: 4.5 mmol/L (ref 3.5–5.1)
Sodium: 143 mmol/L (ref 135–145)
Total Bilirubin: 0.8 mg/dL (ref 0.0–1.2)
Total Protein: 7.3 g/dL (ref 6.5–8.1)

## 2024-10-24 LAB — CBC WITH DIFFERENTIAL/PLATELET
Abs Immature Granulocytes: 0.02 K/uL (ref 0.00–0.07)
Basophils Absolute: 0.1 K/uL (ref 0.0–0.1)
Basophils Relative: 1 %
Eosinophils Absolute: 0.2 K/uL (ref 0.0–0.5)
Eosinophils Relative: 2 %
HCT: 39.2 % (ref 36.0–46.0)
Hemoglobin: 12.5 g/dL (ref 12.0–15.0)
Immature Granulocytes: 0 %
Lymphocytes Relative: 16 %
Lymphs Abs: 1.5 K/uL (ref 0.7–4.0)
MCH: 30.3 pg (ref 26.0–34.0)
MCHC: 31.9 g/dL (ref 30.0–36.0)
MCV: 95.1 fL (ref 80.0–100.0)
Monocytes Absolute: 0.9 K/uL (ref 0.1–1.0)
Monocytes Relative: 10 %
Neutro Abs: 6.6 K/uL (ref 1.7–7.7)
Neutrophils Relative %: 71 %
Platelets: 211 K/uL (ref 150–400)
RBC: 4.12 MIL/uL (ref 3.87–5.11)
RDW: 14.7 % (ref 11.5–15.5)
WBC: 9.2 K/uL (ref 4.0–10.5)
nRBC: 0 % (ref 0.0–0.2)

## 2024-10-24 LAB — URINALYSIS, ROUTINE W REFLEX MICROSCOPIC
Bilirubin Urine: NEGATIVE
Glucose, UA: NEGATIVE mg/dL
Hgb urine dipstick: NEGATIVE
Ketones, ur: NEGATIVE mg/dL
Nitrite: NEGATIVE
Protein, ur: NEGATIVE mg/dL
Specific Gravity, Urine: 1.017 (ref 1.005–1.030)
pH: 7 (ref 5.0–8.0)

## 2024-10-24 LAB — TROPONIN T, HIGH SENSITIVITY
Troponin T High Sensitivity: 16 ng/L (ref 0–19)
Troponin T High Sensitivity: 16 ng/L (ref 0–19)

## 2024-10-24 LAB — LIPASE, BLOOD: Lipase: 44 U/L (ref 11–51)

## 2024-10-24 LAB — RESP PANEL BY RT-PCR (RSV, FLU A&B, COVID)  RVPGX2
Influenza A by PCR: NEGATIVE
Influenza B by PCR: NEGATIVE
Resp Syncytial Virus by PCR: NEGATIVE
SARS Coronavirus 2 by RT PCR: NEGATIVE

## 2024-10-24 MED ORDER — BENZONATATE 200 MG PO CAPS: 200.0000 mg | ORAL_CAPSULE | Freq: Three times a day (TID) | ORAL | 0 refills | Status: AC | PRN

## 2024-10-24 MED ORDER — ALBUTEROL SULFATE (2.5 MG/3ML) 0.083% IN NEBU
5.0000 mg/h | INHALATION_SOLUTION | Freq: Once | RESPIRATORY_TRACT | Status: AC
  Administered 2024-10-24: 5 mg/h via RESPIRATORY_TRACT
  Filled 2024-10-24: qty 6

## 2024-10-24 MED ORDER — ONDANSETRON 4 MG PO TBDP
4.0000 mg | ORAL_TABLET | Freq: Once | ORAL | Status: DC
  Filled 2024-10-24: qty 1

## 2024-10-24 MED ORDER — ALBUTEROL SULFATE (2.5 MG/3ML) 0.083% IN NEBU: 2.5000 mg | INHALATION_SOLUTION | Freq: Four times a day (QID) | RESPIRATORY_TRACT | 3 refills | Status: AC | PRN

## 2024-10-24 MED ORDER — IPRATROPIUM-ALBUTEROL 0.5-2.5 (3) MG/3ML IN SOLN
3.0000 mL | Freq: Once | RESPIRATORY_TRACT | Status: AC
  Administered 2024-10-24: 3 mL via RESPIRATORY_TRACT
  Filled 2024-10-24: qty 3

## 2024-10-24 MED ORDER — PREDNISONE 20 MG PO TABS: 40.0000 mg | ORAL_TABLET | Freq: Every day | ORAL | 0 refills | Status: DC

## 2024-10-24 NOTE — ED Triage Notes (Signed)
 Pt arrived via [facility] from her PCP office who called 911 reporting the Pt was presents SOB and lethargic. Pt reports not feeling well for the past few days, endorsing N/V/D. EMS report giving a breathing treatment PTA and report on scene Pt was 96% on room air.

## 2024-10-24 NOTE — ED Notes (Signed)
 Patient transported to X-ray

## 2024-10-24 NOTE — ED Provider Notes (Signed)
 [place] EMERGENCY DEPARTMENT AT [facility] Provider Note   CSN: [id number] Arrival date & time: 10/24/24  1134     Patient presents with: Cough   Debra [name] is a 78 y.o. female.   Patient is here for evaluation of cough, congestion, shortness of breath, and generally feeling unwell for the last 2 days.  Patient has a history of asthma, COPD, and CKD.  She has been feeling unwell since the day before yesterday.  She reports productive cough with green sputum production.  She also reports nausea with no episodes of emesis.  She has been eating less over the last 2 days due to this nausea.  She has not eaten anything today.  She has also not taken any of her daily medications for the last 2 days including, her daily a.m. and p.m. inhaler treatment.  Patient also has oxygen  by nasal cannula at home that she uses infrequently.  She said that she did use this yesterday helped with her shortness of breath symptoms.  Shortness of breath is worse with exertion.  She denies any chest pain, dizziness, loss of consciousness, falls, head injury, abdominal pain, urinary or bowel changes.  She does report mild shortness of breath while at rest that is worse than her baseline.  She denies any recent abdomen, ankle, or leg swelling.  She denies any known fevers.  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  Patient was seen at her primary care physician's office this morning for evaluation of current symptoms.  She was recommended to go to the ED for further evaluation.  She was given a breathing treatment in office which she states helped with her shortness of breath symptoms.  She is not aware what medications were in this breathing treatment.  She was transported here by EMS.  Her O2 saturation was stable enroute per EMS and continues to be stable at 98% on room air.    The history is  provided by the patient.  Cough Cough characteristics:  Productive Sputum characteristics:  Green Duration:  2 days Progression:  Worsening Relieved by:  Home nebulizer Associated symptoms: myalgias, shortness of breath and sinus congestion   Associated symptoms: no chest pain, no fever and no headaches        Prior to Admission medications   Medication Sig Start Date End Date Taking? Authorizing Provider  acetaminophen  (TYLENOL ) 325 MG tablet Take 650 mg by mouth every 6 (six) hours as needed for moderate pain.    [provider]  albuterol  (PROVENTIL ) (2.5 MG/3ML) 0.083% nebulizer solution Take 2.5 mg by nebulization every 6 (six) hours as needed. 07/23/23   [provider]  amLODipine  (NORVASC ) 5 MG tablet Take 5 mg by mouth daily.    [provider]  Ascorbic Acid (VITAMIN C PO) Take by mouth.    [provider]  aspirin  81 MG tablet Take 81 mg by mouth daily.    [provider]  atorvastatin  (LIPITOR) 80 MG tablet Take 80 mg by mouth daily.    [provider]  Azelastine -Fluticasone  (DYMISTA ) 137-50 MCG/ACT SUSP Place 1 spray into both nostrils 2 (two) times daily. 06/29/24   Iva Marty Saltness, MD  baclofen (LIORESAL) 10 MG tablet 1 tablet as needed Orally Twice a day for 10 days As needed, take when at home only 05/27/23   [provider]  BREZTRI  AEROSPHERE 160-9-4.8 MCG/ACT  AERO Inhale 2 puffs into the lungs 2 (two) times daily. 11/02/23   Iva Marty Saltness, MD  calcitRIOL (ROCALTROL) 0.25 MCG capsule Take 0.25 mcg by mouth 2 (two) times a week.    [provider]  Calcium  Carb-Cholecalciferol (CALCIUM  600+D3 PO) Take 1 tablet by mouth daily.    [provider]  CALCIUM  MAGNESIUM  ZINC PO Take by mouth.    [provider]  ciclopirox (LOPROX) 0.77 % cream Apply 1 Application topically 2 (two) times daily as needed (irritation on feet.). Apply to toenails, and feet 07/07/22   [provider]  citalopram  (CELEXA ) 20 MG tablet Take 20 mg by mouth daily. 10/06/21   [provider]  diclofenac Sodium (VOLTAREN) 1 % GEL Apply 2 g topically 4 (four) times daily as needed (pain).    [provider]  DUPIXENT  300 MG/2ML prefilled syringe  02/20/24   [provider]  fexofenadine  (ALLEGRA ) 180 MG tablet TAKE 1 TABLET (180 MG TOTAL) BY MOUTH 2 (TWO) TIMES DAILY AS NEEDED FOR ALLERGIES OR RHINITIS. 05/03/24   Iva Marty Saltness, MD  fluticasone  (FLONASE ) 50 MCG/ACT nasal spray Place 1 spray into both nostrils daily as needed for allergies.    [provider]  furosemide  (LASIX ) 20 MG tablet Take 20 mg by mouth daily as needed for edema. 07/12/21   [provider]  gabapentin  (NEURONTIN ) 400 MG capsule Take 400 mg by mouth 3 (three) times daily. 07/22/21   [provider]  hydrochlorothiazide  (HYDRODIURIL ) 12.5 MG tablet Take 12.5 mg by mouth daily. 07/24/19   [provider]  ipratropium (ATROVENT) 0.06 % nasal spray Place 1 spray into both nostrils daily as needed for rhinitis.    [provider]  ipratropium-albuterol  (DUONEB) 0.5-2.5 (3) MG/3ML SOLN Inhale 3 mLs into the lungs every 4 (four) hours as needed (SOB/Wheezing). 06/20/18   [provider]  meloxicam (MOBIC) 7.5 MG tablet 1 tablet Orally Once a day    [provider]  metoprolol  (LOPRESSOR ) 100 MG tablet Take 100 mg by mouth 2 (two) times daily. 03/13/17   [provider]  metroNIDAZOLE  (METROGEL ) 0.75 % vaginal gel Place 1 applicatorful at bedtime for 5 days then as instructed 08/30/24   Ozan, Jennifer, DO  montelukast  (SINGULAIR ) 10 MG tablet TAKE 1 TABLET BY MOUTH EVERY DAY 10/31/23   Iva Marty Saltness, MD  MUCINEX  600 MG 12 hr tablet 1 tablet as needed Orally every 12 hrs for 2 days 11/28/23   [provider]  nystatin (MYCOSTATIN) 100000 UNIT/ML suspension PLEASE SEE ATTACHED FOR DETAILED DIRECTIONS    [provider]  olmesartan  (BENICAR ) 20 MG tablet TAKE 1 TABLET BY MOUTH EVERY DAY 05/06/22   Wert, Michael B, MD  Omega-3 Fatty Acids (FISH OIL PO) Take by mouth.    [provider]  omeprazole  (PRILOSEC) 40 MG capsule Take 1 capsule (40 mg total) by mouth daily. 06/29/24 09/27/24  Iva Marty Saltness, MD  OXYGEN  Inhale 2 L into the lungs as needed.    [provider]  prednisoLONE sodium phosphate  (INFLAMASE FORTE) 1 % ophthalmic solution Place 1 drop into the left eye 4 (four) times daily.    [provider]  UNABLE TO FIND Alaplex-daily    [provider]  Vitamin D, Ergocalciferol, (DRISDOL) 1.25 MG (50000 UT) CAPS capsule Take 50,000 Units by mouth every 30 (thirty) days. 05/03/19   [provider]    Allergies: Patient has no known allergies.  Review of Systems  Constitutional:  Negative for fever.  HENT:  Positive for congestion.   Respiratory:  Positive for cough and shortness of breath.   Cardiovascular:  Negative for chest pain.  Gastrointestinal:  Positive for nausea. Negative for abdominal pain, blood in stool, constipation, diarrhea and vomiting.  Genitourinary:  Negative for decreased urine volume, difficulty urinating, dysuria, flank pain, frequency, hematuria and urgency.  Musculoskeletal:  Positive for myalgias.  Skin:  Negative for pallor.  Neurological:  Positive for weakness. Negative for dizziness, syncope and headaches.    Updated Vital Signs BP 130/66 (BP Location: Right Arm)   Pulse 94   Temp 98.4 F (36.9 C)   Resp 19   Ht 5' 2 (1.575 m)   Wt 74.8 kg   SpO2 98%   BMI 30.16 kg/m   Physical Exam Vitals and nursing note reviewed.  Constitutional:      General: She is not in acute distress.    Appearance: Normal appearance. She is not ill-appearing.  HENT:     Head: Normocephalic and atraumatic.     Mouth/Throat:     Mouth: Mucous membranes are moist.  Eyes:     Extraocular Movements: Extraocular  movements intact.  Cardiovascular:     Rate and Rhythm: Normal rate and regular rhythm.     Pulses: Normal pulses.     Heart sounds: Normal heart sounds.  Pulmonary:     Effort: Pulmonary effort is normal. No respiratory distress.     Breath sounds: No stridor. Wheezing (Expiratory wheezing throughout.) present. No rhonchi or rales.  Abdominal:     General: Abdomen is flat. Bowel sounds are normal. There is no distension.     Palpations: Abdomen is soft.     Tenderness: There is no abdominal tenderness. There is no guarding.  Musculoskeletal:     Right lower leg: No edema.     Left lower leg: No edema.  Skin:    General: Skin is warm and dry.     Coloration: Skin is not pale.  Neurological:     Mental Status: She is alert and oriented to person, place, and time.     (all labs ordered are listed, but only abnormal results are displayed) Labs Reviewed  RESP PANEL BY RT-PCR (RSV, FLU A&B, COVID)  [id number]  CBC WITH DIFFERENTIAL/PLATELET  COMPREHENSIVE METABOLIC PANEL WITH GFR  LIPASE, BLOOD  URINALYSIS, ROUTINE W REFLEX MICROSCOPIC    EKG: EKG Interpretation Date/Time:  Wednesday October 24 2024 11:49:55 EST Ventricular Rate:  95 PR Interval:  166 QRS Duration:  142 QT Interval:  402 QTC Calculation: 505 R Axis:   -54  Text Interpretation: Normal sinus rhythm Possible Left atrial enlargement Left axis deviation Right bundle branch block similar to prior Confirmed by Garrick Charleston [phone]) on 10/24/2024 12:03:28 PM  Radiology: No results found.   Procedures   Medications Ordered in the ED - No data to display    Patient presents to the ED for concern of cough, congestion, shortness of breath, malaise, this involves an extensive number of treatment options, and is a complaint that carries with it a high risk of complications and morbidity.  The differential diagnosis includes PE, COPD exacerbation, asthma exacerbation, pneumonia, URI, pneumothorax, heart failure, MI,  PE.   Co morbidities that complicate the patient evaluation  Asthma, COPD   Lab Tests:  I Ordered, and personally interpreted labs.  The pertinent results include: Elevated LFTs; last LFTs were checked in December of last year and  were normal.  No leukocytosis.  Normal troponin.  Urinalysis with no evidence of UTI.   Imaging Studies ordered:  I ordered imaging studies including chest x-ray I independently visualized and interpreted imaging which showed no consolidation. I agree with the radiologist interpretation   Cardiac Monitoring:  The patient was maintained on a cardiac monitor.  I personally viewed and interpreted the cardiac monitored which showed an underlying rhythm of: Normal sinus rhythm with right bundle branch block and no acute evidence of MI.   Medicines ordered and prescription drug management:  I ordered medication including albuterol  nebulizer for continued shortness of breath and wheezing. Reevaluation of the patient after these medicines showed that the patient improved I have reviewed the patients home medicines and have made adjustments as needed   Problem List / ED Course:  Patient is here for evaluation of shortness of breath, productive cough with sputum production, and general malaise.  Labs, imaging, EKG.  Albuterol  nebulizer for continued shortness of breath and wheezing.  Stable SpO2 on room air.   Reevaluation:  After the interventions noted above, I reevaluated the patient and found that they have :improved   Dispostion:  Patient receiving ongoing medication treatment at signout.                                   Medical Decision Making Amount and/or Complexity of Data Reviewed Labs: ordered. Radiology: ordered.  Risk Prescription drug management.       Final diagnoses:  None    ED Discharge Orders     None          Debra [name] 10/24/24 1546    Garrick Charleston, MD 10/25/24 [phone]

## 2024-10-24 NOTE — Discharge Instructions (Signed)
 Please monitor your oxygen  levels at home, use your albuterol  treatments 1 treatment every 4-6 hours as needed.  You have been prescribed medication to help with your cough and prednisone  to help with your symptoms.  Please follow-up with your primary care provider for recheck or as discussed, return to the emergency department for any new or worsening symptoms.

## 2024-10-24 NOTE — ED Notes (Signed)
 Pt amb with pulse ox; lowest pts o2 dropped to while amb was 95%; PA notified

## 2024-10-24 NOTE — ED Provider Notes (Signed)
   Patient signed out to me by Almarie Knee, PA-C pending reevaluation.  Patient here for 2-day history of cough and URI symptoms.  Denies any vomiting or fever.  She has history of COPD and asthma.  Has albuterol  belies her and oxygen  at home uses on an as-needed basis.  She was brought in by EMS from PCPs office noted to be short of breath and lethargic.  She was given albuterol  treatment prior to arrival and O2 sat on the scene was reported at 96% on room air.  She has not been hypoxic during her evaluation here.  On my exam, patient exhibiting nasal congestion, she is tolerating oral fluids without difficulty.  No nausea or vomiting here.  Her vital signs are reassuring.  She ambulated in the department without difficulty or hypoxia.  No tachycardia or tachypnea.  Her workup today was overall reassuring.  Respiratory panel negative.  No leukocytosis.  She does have mild the elevated transaminases.  Her kidney function appears at baseline troponin unremarkable.  EKG shows sinus rhythm with probable left atrial enlargement right bundle branch block and LAFB.  Reviewed by Dr. Garrick.  Chest x-ray reassuring.  Patient reports feeling better after second neb treatment.  Feels appropriate for discharge home.  I feel reasonable to treat symptomatically with Tessalon  for her cough and she has albuterol  MDI at home and I will refill vials for her nebulizer and course of steroids.  She will follow-up closely outpatient with her PCP and she was given strict return precautions.        Herlinda Milling, PA-C 10/24/24 1817    Garrick Charleston, MD 10/25/24 [phone]

## 2024-10-24 NOTE — ED Notes (Signed)
 Pt returned from xray

## 2024-10-29 ENCOUNTER — Ambulatory Visit (INDEPENDENT_AMBULATORY_CARE_PROVIDER_SITE_OTHER)

## 2024-10-29 DIAGNOSIS — J4550 Severe persistent asthma, uncomplicated: Secondary | ICD-10-CM

## 2024-11-12 ENCOUNTER — Ambulatory Visit

## 2024-11-12 DIAGNOSIS — J4550 Severe persistent asthma, uncomplicated: Secondary | ICD-10-CM

## 2024-11-23 ENCOUNTER — Ambulatory Visit: Admitting: Obstetrics & Gynecology

## 2024-11-26 ENCOUNTER — Ambulatory Visit

## 2024-11-26 DIAGNOSIS — J4550 Severe persistent asthma, uncomplicated: Secondary | ICD-10-CM

## 2024-11-29 ENCOUNTER — Ambulatory Visit: Admitting: Obstetrics & Gynecology

## 2024-12-10 ENCOUNTER — Ambulatory Visit

## 2024-12-10 DIAGNOSIS — J4550 Severe persistent asthma, uncomplicated: Secondary | ICD-10-CM | POA: Diagnosis not present

## 2024-12-24 ENCOUNTER — Ambulatory Visit (INDEPENDENT_AMBULATORY_CARE_PROVIDER_SITE_OTHER)

## 2024-12-24 DIAGNOSIS — J4550 Severe persistent asthma, uncomplicated: Secondary | ICD-10-CM

## 2024-12-27 ENCOUNTER — Encounter: Payer: Self-pay | Admitting: Obstetrics & Gynecology

## 2024-12-27 ENCOUNTER — Other Ambulatory Visit (HOSPITAL_COMMUNITY)
Admission: RE | Admit: 2024-12-27 | Discharge: 2024-12-27 | Disposition: A | Source: Ambulatory Visit | Attending: Obstetrics & Gynecology | Admitting: Obstetrics & Gynecology

## 2024-12-27 ENCOUNTER — Ambulatory Visit: Admitting: Obstetrics & Gynecology

## 2024-12-27 VITALS — BP 196/75 | Ht 62.0 in | Wt 162.2 lb

## 2024-12-27 DIAGNOSIS — N898 Other specified noninflammatory disorders of vagina: Secondary | ICD-10-CM | POA: Insufficient documentation

## 2024-12-27 DIAGNOSIS — N761 Subacute and chronic vaginitis: Secondary | ICD-10-CM | POA: Diagnosis not present

## 2024-12-27 DIAGNOSIS — N760 Acute vaginitis: Secondary | ICD-10-CM

## 2024-12-27 DIAGNOSIS — I10 Essential (primary) hypertension: Secondary | ICD-10-CM | POA: Diagnosis not present

## 2024-12-27 NOTE — Progress Notes (Signed)
" ° ° ° °  GYN VISIT Patient name: Debra [name] MRN [id number]  Date of birth: January 28, 1946 Chief Complaint:    Chief Complaint  Patient presents with   Pessary Check   History of Present Illness:   Debra [name] is a 79 y.o. H4E6885 PM, PH female being seen today for pessary follow up.  Today she notes that the device actually fell out on its own.  She is not exactly sure when this happened, but she has been doing okay without it.     In review with the Milex ring #2 she was noting intermittent recurrent vaginitis-typically BV.  She states that today she still is having intermittent symptoms and currently notes vaginal itching and discomfort.   Review of Systems:   Pertinent items are noted in HPI Denies fever/chills, dizziness, headaches, visual disturbances, fatigue, shortness of breath, chest pain, abdominal pain, vomiting. Pertinent History Reviewed:  Reviewed past medical,surgical, social, obstetrical and family history.  Reviewed problem list, medications and allergies. Physical Assessment:   Vitals:   12/27/24 1111 12/27/24 1114  BP: (!) 168/66 (!) 196/75  Weight: 162 lb 3.2 oz (73.6 kg)   Height: 5' 2 (1.575 m)   Body mass index is 29.67 kg/m.       Physical Examination:   General appearance: alert, well appearing, and in no distress  Psych: mood appropriate, normal affect  Skin: warm & dry   Cardiovascular: normal heart rate noted  Respiratory: normal respiratory effort, no distress  Abdomen: soft, non-tender   GU: normal external genitalia.  Stage I cystocele noted.  Vaginal mucosa pink with clumpy white discharge noted  Extremities: no edema   Chaperone: Aleck Prestion    Assessment & Plan:     ICD-10-CM   1. Recurrent vaginitis  N76.0 Cervicovaginal ancillary only    2. Essential hypertension  I10     3. Vaginal discharge  N89.8 Cervicovaginal ancillary only      - Findings suggestive of yeast, await results of vaginitis panel for treatment - For now we  will take a break from pessary, should she note return of symptoms.  Patient to bring in pessary and will replace - Plan to follow-up yearly or as needed  -chronic HTN Followed by PCP   No orders of the defined types were placed in this encounter.   Return in about 1 year (around 12/27/2025).   Debra Astarita, DO Attending Obstetrician & Gynecologist, [facility] for [facility], [facility]    "

## 2024-12-31 ENCOUNTER — Ambulatory Visit: Payer: Self-pay | Admitting: Obstetrics & Gynecology

## 2024-12-31 ENCOUNTER — Telehealth: Payer: Self-pay

## 2024-12-31 DIAGNOSIS — N760 Acute vaginitis: Secondary | ICD-10-CM

## 2024-12-31 LAB — CERVICOVAGINAL ANCILLARY ONLY
Bacterial Vaginitis (gardnerella): NEGATIVE
Candida Glabrata: NEGATIVE
Candida Vaginitis: POSITIVE — AB
Comment: NEGATIVE
Comment: NEGATIVE
Comment: NEGATIVE

## 2024-12-31 MED ORDER — FLUCONAZOLE 150 MG PO TABS: ORAL_TABLET | ORAL | 0 refills | Status: AC

## 2024-12-31 NOTE — Telephone Encounter (Signed)
 LVM with patient about medication sent in. Instructed to call back for more detail

## 2025-01-07 ENCOUNTER — Ambulatory Visit

## 2025-01-07 DIAGNOSIS — J4550 Severe persistent asthma, uncomplicated: Secondary | ICD-10-CM

## 2025-01-11 ENCOUNTER — Ambulatory Visit: Admitting: Allergy & Immunology

## 2025-01-11 ENCOUNTER — Encounter: Payer: Self-pay | Admitting: Allergy & Immunology

## 2025-01-11 VITALS — BP 132/60 | HR 63 | Temp 98.1°F | Resp 16 | Wt 161.0 lb

## 2025-01-11 DIAGNOSIS — J4550 Severe persistent asthma, uncomplicated: Secondary | ICD-10-CM | POA: Diagnosis not present

## 2025-01-11 DIAGNOSIS — J3089 Other allergic rhinitis: Secondary | ICD-10-CM

## 2025-01-11 DIAGNOSIS — K219 Gastro-esophageal reflux disease without esophagitis: Secondary | ICD-10-CM

## 2025-01-11 MED ORDER — BREZTRI AEROSPHERE 160-9-4.8 MCG/ACT IN AERO: 2.0000 | INHALATION_SPRAY | Freq: Two times a day (BID) | RESPIRATORY_TRACT | 3 refills | Status: AC

## 2025-01-11 MED ORDER — FLUTICASONE PROPIONATE 50 MCG/ACT NA SUSP: 1.0000 | Freq: Every day | NASAL | 5 refills | Status: AC | PRN

## 2025-01-11 MED ORDER — OMEPRAZOLE 40 MG PO CPDR: 40.0000 mg | DELAYED_RELEASE_CAPSULE | Freq: Every day | ORAL | 1 refills | Status: AC

## 2025-01-11 MED ORDER — IPRATROPIUM BROMIDE 0.06 % NA SOLN: 1.0000 | Freq: Four times a day (QID) | NASAL | 5 refills | Status: AC | PRN

## 2025-01-11 NOTE — Progress Notes (Signed)
 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 Darlean)   Perennial allergic rhinitis - on Flonase  and Atrovent   Previous smoker (30+ years)   Recurrent infections - antibiotics 3-4 times per year (sinusitis mostly) but with excellent response to Pnuemovax   RA - was on chronic prednisone , but now prednisone -free  Plan/Recommendations:   1. Asthma-COPD overlap syndrome, uncomplicated - Lung testing looks stable today.  - We are not going to make any medication changes at this point in time,  - Daily controller medication(s): Dupixent  300mg  every 2 weeks and Breztri  two puffs twice daily with spacer - Prior to physical activity: albuterol  2 puffs 10-15 minutes before physical activity. - Rescue medications: albuterol  4 puffs every 4-6 hours as needed - Asthma control goals:  * Full participation in all desired activities (may need albuterol  before activity) * Albuterol  use two time or less a week on average (not counting use with activity) * Cough interfering with sleep two time or less a month * Oral steroids no more than once a year * No hospitalizations  2. Perennial allergic rhinitis - Continue with Allegra  one tablet daily (you can INCREASE to twice daily)  - Continue with Atrovent (ipratropium) one spray per nostril up to 3 times daily for runny noses.  - Continue with Flonase  one spray per nostril TWICE daily. - Continue with nasal saline rinses as needed.  3. GERD  - Continue omeprazole  40mg  daily.   4. Return in about 6 months (around 07/11/2025). You can have the follow up appointment with Dr. Iva or a Nurse Practicioner (our Nurse Practitioners are excellent and always have Physician oversight!).   Subjective:   Debra [name] is a 79 y.o. 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   Moderate persistent asthma, uncomplicated 10/25/2023   Coronary arteriosclerosis 10/25/2023   Shortness of breath 10/25/2023   Cylindrical bronchiectasis (HCC) 10/25/2023   Hypercholesteremia 10/25/2023   Chronic respiratory failure with hypoxia and hypercapnia (HCC) 10/25/2023   COPD with acute exacerbation (HCC) 12/11/2022   Depression 12/11/2022   Chronic kidney disease, stage 3b (HCC) 12/11/2022   Sepsis (HCC) 12/11/2022   CAP (community acquired pneumonia) 12/10/2022   Pelvic mass 07/27/2022   Ovarian cyst 03/08/2022   Essential hypertension 06/11/2021   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  Discussed the use of AI scribe software for clinical note transcription with the patient and/or guardian, who gave verbal consent to proceed.  Enrica is a 79 y.o. female presenting for a follow up visit.  She was last seen in July 2025.  At that time, Lyme testing was much better.  We continue with Fasenra  every 8 weeks and Breztri  2 puffs twice daily.  For her rhinitis, she continue with Allegra , increasing to  twice daily on the worst days.  We also continue with Flonase .  GERD was controlled with omeprazole .  Since last visit, she has done well.  Asthma/Respiratory Symptom History: She experiences dyspnea primarily during physical exertion, such as lifting heavy objects or climbing stairs,  especially when ascending from her basement. She uses Breztri  twice daily and an emergency inhaler as needed, which she finds helpful before exertion. She has switched from Fasenra  to Dupixent  and reports that she thinks it is better for her breathing. She does have some issues when she is coming up stairs. She has not tried premedicating. She definitely feels better with the inhalers on board, however. She transitioned to Dupixent  in March 2025 and has done well since that time.   She works long hours, often starting early in the morning and working late into the evening, with a schedule of four days on and four days off. She has been working extra hours due to her boss being on vacation. She has a generator at home for emergencies, although she rarely loses electricity.   Allergic Rhinitis Symptom History: She experiences sinus-related headaches, particularly in the mornings, which she attributes to dry heat. She uses Flonase  and another nasal spray to manage her symptoms and requests a refill for her nasal spray as she is running low.  Infection Symptom History: She does need prednisone  when she gets sick. This is prescribed by her PCP, around twice per year.  She has not needed antibiotics at all.   Regarding her rheumatoid arthritis, she is no longer on prednisone  and only uses aspirin  occasionally for pain relief. She has not had a cortisone shot in over a year and avoids NSAIDs due to kidney concerns. She is no longer on the prednisone  for her RA. She will sometimes use the Aspercreme which does help with her pain.   Otherwise, there have been no changes to her past medical history, surgical history, family history, or social history.    Review of systems otherwise negative other than that mentioned in the HPI.    Objective:   Blood pressure 132/60, pulse 63, temperature 98.1 F (36.7 C), resp. rate 16, weight 161 lb (73 kg), SpO2 94%. Body mass index is 29.45 kg/m.    Physical  Exam Vitals reviewed.  Constitutional:      Appearance: She is well-developed.     Comments: Smiling and laughing.  HENT:     Head: Normocephalic and atraumatic.     Right Ear: Tympanic membrane, ear canal and external ear normal. No drainage, swelling or tenderness. Tympanic membrane is not injected, scarred, erythematous, retracted or bulging.     Left Ear: Tympanic membrane, ear canal and external ear normal. No drainage, swelling or tenderness. Tympanic membrane is not injected, scarred, erythematous, retracted or bulging.     Nose: No nasal deformity, septal deviation, mucosal edema or rhinorrhea.     Right Turbinates: Enlarged, swollen and pale.     Left Turbinates: Enlarged, swollen and pale.     Right Sinus: No maxillary sinus tenderness or frontal sinus tenderness.     Left Sinus: No maxillary sinus tenderness or frontal sinus tenderness.     Mouth/Throat:     Lips: Pink.     Mouth: Mucous membranes are moist. Mucous membranes are not pale and not dry.     Pharynx: Uvula midline.  Eyes:     General: Lids are normal. Allergic shiner present.        Right eye: No discharge.  Left eye: No discharge.     Conjunctiva/sclera: Conjunctivae normal.     Right eye: Right conjunctiva is not injected. No chemosis.    Left eye: Left conjunctiva is not injected. No chemosis.    Pupils: Pupils are equal, round, and reactive to light.  Cardiovascular:     Rate and Rhythm: Normal rate and regular rhythm.     Heart sounds: Normal heart sounds.  Pulmonary:     Effort: Pulmonary effort is normal. No tachypnea, accessory muscle usage or respiratory distress.     Breath sounds: Normal breath sounds. No wheezing, rhonchi or rales.     Comments: No crackles or wheezes noted.  Chest:     Chest wall: No tenderness.  Abdominal:     Tenderness: There is no abdominal tenderness. There is no guarding or rebound.  Lymphadenopathy:     Head:     Right side of head: No submandibular, tonsillar  or occipital adenopathy.     Left side of head: No submandibular, tonsillar or occipital adenopathy.     Cervical: No cervical adenopathy.  Skin:    Coloration: Skin is not pale.     Findings: No abrasion, erythema, petechiae or rash. Rash is not papular, urticarial or vesicular.  Neurological:     Mental Status: She is alert.  Psychiatric:        Behavior: Behavior is cooperative.      Diagnostic studies:    Spirometry: results abnormal (FEV1: 0.76/50%, FVC: 0.90/46%, FEV1/FVC: 84%).    Spirometry consistent with possible restrictive disease.   Allergy Studies: none       Marty Shaggy, MD  [facility]        "

## 2025-01-11 NOTE — Patient Instructions (Addendum)
 1. Asthma-COPD overlap syndrome, uncomplicated - Lung testing looks stable today.  - We are not going to make any medication changes at this point in time,  - Daily controller medication(s): Dupixent  300mg  every 2 weeks and Breztri  two puffs twice daily with spacer - Prior to physical activity: albuterol  2 puffs 10-15 minutes before physical activity. - Rescue medications: albuterol  4 puffs every 4-6 hours as needed - Asthma control goals:  * Full participation in all desired activities (may need albuterol  before activity) * Albuterol  use two time or less a week on average (not counting use with activity) * Cough interfering with sleep two time or less a month * Oral steroids no more than once a year * No hospitalizations  2. Perennial allergic rhinitis - Continue with Allegra  one tablet daily (you can INCREASE to twice daily)  - Continue with Atrovent (ipratropium) one spray per nostril up to 3 times daily for runny noses.  - Continue with Flonase  one spray per nostril TWICE daily. - Continue with nasal saline rinses as needed.  3. GERD  - Continue omeprazole  40mg  daily.   4. Return in about 6 months (around 07/11/2025). You can have the follow up appointment with Dr. Iva or a Nurse Practicioner (our Nurse Practitioners are excellent and always have Physician oversight!).    Please inform us  of any Emergency Department visits, hospitalizations, or changes in symptoms. Call us  before going to the ED for breathing or allergy symptoms since we might be able to fit you in for a sick visit. Feel free to contact us  anytime with any questions, problems, or concerns.  It was a pleasure to see you again today! FEEL BETTER!   Websites that have reliable patient information: 1. American Academy of Asthma, Allergy, and Immunology: www.aaaai.org 2. Food Allergy Research and Education (FARE): foodallergy.org 3. Mothers of Asthmatics: http://www.asthmacommunitynetwork.org 4. American College  of Allergy, Asthma, and Immunology: www.acaai.org      Like us  on [employer] and Instagram for our latest updates!      A healthy democracy works best when [employer] participate! Make sure you are registered to vote! If you have moved or changed any of your contact information, you will need to get this updated before voting! Scan the QR codes below to learn more!

## 2025-01-16 ENCOUNTER — Ambulatory Visit: Admitting: Internal Medicine

## 2025-01-21 ENCOUNTER — Ambulatory Visit

## 2025-01-30 ENCOUNTER — Ambulatory Visit

## 2025-02-11 ENCOUNTER — Ambulatory Visit: Admitting: Internal Medicine

## 2025-04-30 ENCOUNTER — Inpatient Hospital Stay

## 2025-04-30 ENCOUNTER — Inpatient Hospital Stay: Admitting: Gynecologic Oncology

## 2025-05-02 ENCOUNTER — Inpatient Hospital Stay: Admitting: Gynecologic Oncology

## 2025-07-24 ENCOUNTER — Ambulatory Visit: Admitting: Allergy & Immunology
